# Patient Record
Sex: Male | Born: 1940 | Race: White | Hispanic: No | Marital: Married | Smoking: Never smoker
Health system: Southern US, Community
[De-identification: ages and names within clinical notes are randomized; demographics above are authoritative.]

## PROBLEM LIST (undated history)

## (undated) DIAGNOSIS — C61 Malignant neoplasm of prostate: Secondary | ICD-10-CM

## (undated) DIAGNOSIS — E119 Type 2 diabetes mellitus without complications: Secondary | ICD-10-CM

## (undated) DIAGNOSIS — I10 Essential (primary) hypertension: Secondary | ICD-10-CM

## (undated) DIAGNOSIS — R011 Cardiac murmur, unspecified: Secondary | ICD-10-CM

## (undated) DIAGNOSIS — I2510 Atherosclerotic heart disease of native coronary artery without angina pectoris: Secondary | ICD-10-CM

## (undated) DIAGNOSIS — E785 Hyperlipidemia, unspecified: Secondary | ICD-10-CM

## (undated) HISTORY — DX: Atherosclerotic heart disease of native coronary artery without angina pectoris: I25.10

## (undated) HISTORY — DX: Type 2 diabetes mellitus without complications: E11.9

## (undated) HISTORY — PX: HERNIA REPAIR: SHX51

## (undated) HISTORY — DX: Hyperlipidemia, unspecified: E78.5

## (undated) HISTORY — PX: PACEMAKER PLACEMENT: SHX43

## (undated) HISTORY — PX: CORONARY STENT PLACEMENT: SHX1402

## (undated) HISTORY — DX: Essential (primary) hypertension: I10

## (undated) HISTORY — DX: Cardiac murmur, unspecified: R01.1

## (undated) HISTORY — PX: CORONARY ARTERY BYPASS GRAFT: SHX141

---

## 2004-01-01 ENCOUNTER — Other Ambulatory Visit: Payer: Self-pay

## 2004-08-01 ENCOUNTER — Other Ambulatory Visit: Payer: Self-pay

## 2006-04-29 ENCOUNTER — Ambulatory Visit: Payer: Self-pay | Admitting: Gastroenterology

## 2006-12-23 ENCOUNTER — Ambulatory Visit: Payer: Self-pay | Admitting: Cardiovascular Disease

## 2007-10-25 ENCOUNTER — Inpatient Hospital Stay: Payer: Self-pay | Admitting: *Deleted

## 2007-10-25 ENCOUNTER — Other Ambulatory Visit: Payer: Self-pay

## 2008-03-28 ENCOUNTER — Ambulatory Visit: Payer: Self-pay | Admitting: Cardiovascular Disease

## 2010-09-05 ENCOUNTER — Inpatient Hospital Stay: Payer: Self-pay | Admitting: *Deleted

## 2013-01-01 ENCOUNTER — Inpatient Hospital Stay: Payer: Self-pay | Admitting: Internal Medicine

## 2013-01-01 LAB — BASIC METABOLIC PANEL
Anion Gap: 6 — ABNORMAL LOW (ref 7–16)
BUN: 14 mg/dL (ref 7–18)
Calcium, Total: 9.1 mg/dL (ref 8.5–10.1)
Chloride: 106 mmol/L (ref 98–107)
Co2: 28 mmol/L (ref 21–32)
Creatinine: 0.88 mg/dL (ref 0.60–1.30)
EGFR (African American): >60
EGFR (Non-African Amer.): >60
Glucose: 126 mg/dL — ABNORMAL HIGH (ref 65–99)
Osmolality: 281 (ref 275–301)
Potassium: 4.4 mmol/L (ref 3.5–5.1)
Sodium: 140 mmol/L (ref 136–145)

## 2013-01-01 LAB — CBC WITH DIFFERENTIAL/PLATELET
Basophil #: 0 10*3/uL (ref 0.0–0.1)
Basophil %: 0.2 %
Eosinophil #: 0.1 10*3/uL (ref 0.0–0.7)
Eosinophil %: 0.8 %
HCT: 41.6 % (ref 40.0–52.0)
HGB: 13.2 g/dL (ref 13.0–18.0)
Lymphocyte #: 2.2 10*3/uL (ref 1.0–3.6)
Lymphocyte %: 22.2 %
MCH: 29.6 pg (ref 26.0–34.0)
MCHC: 31.7 g/dL — ABNORMAL LOW (ref 32.0–36.0)
MCV: 93 fL (ref 80–100)
Monocyte #: 0.8 x10 3/mm (ref 0.2–1.0)
Monocyte %: 8.3 %
Neutrophil #: 6.8 10*3/uL — ABNORMAL HIGH (ref 1.4–6.5)
Neutrophil %: 68.5 %
Platelet: 144 10*3/uL — ABNORMAL LOW (ref 150–440)
RBC: 4.46 10*6/uL (ref 4.40–5.90)
RDW: 14.3 % (ref 11.5–14.5)
WBC: 10 10*3/uL (ref 3.8–10.6)

## 2013-01-01 LAB — CK TOTAL AND CKMB (NOT AT ARMC)
CK, Total: 148 U/L (ref 35–232)
CK, Total: 139 U/L (ref 35–232)
CK-MB: 1.3 ng/mL (ref 0.5–3.6)
CK-MB: 0.8 ng/mL (ref 0.5–3.6)

## 2013-01-01 LAB — TROPONIN I
Troponin-I: <0.02 ng/mL
Troponin-I: 0.15 ng/mL — ABNORMAL HIGH

## 2013-01-01 LAB — PROTIME-INR
INR: 1
Prothrombin Time: 13.1 secs (ref 11.5–14.7)

## 2013-01-01 LAB — TSH: Thyroid Stimulating Horm: 3.61 u[IU]/mL

## 2013-01-02 LAB — CBC WITH DIFFERENTIAL/PLATELET
Basophil #: 0 10*3/uL (ref 0.0–0.1)
Basophil %: 0.6 %
Eosinophil #: 0.1 10*3/uL (ref 0.0–0.7)
Eosinophil %: 1.1 %
HCT: 39.8 % — ABNORMAL LOW (ref 40.0–52.0)
HGB: 13.1 g/dL (ref 13.0–18.0)
Lymphocyte #: 1.5 10*3/uL (ref 1.0–3.6)
Lymphocyte %: 20.4 %
MCH: 30.7 pg (ref 26.0–34.0)
MCHC: 33.1 g/dL (ref 32.0–36.0)
MCV: 93 fL (ref 80–100)
Monocyte #: 0.7 x10 3/mm (ref 0.2–1.0)
Monocyte %: 9.2 %
Neutrophil #: 5 10*3/uL (ref 1.4–6.5)
Neutrophil %: 68.7 %
Platelet: 125 10*3/uL — ABNORMAL LOW (ref 150–440)
RBC: 4.28 10*6/uL — ABNORMAL LOW (ref 4.40–5.90)
RDW: 14.5 % (ref 11.5–14.5)
WBC: 7.3 10*3/uL (ref 3.8–10.6)

## 2013-01-02 LAB — CK TOTAL AND CKMB (NOT AT ARMC)
CK, Total: 121 U/L (ref 35–232)
CK-MB: 0.6 ng/mL (ref 0.5–3.6)

## 2013-01-02 LAB — BASIC METABOLIC PANEL
Anion Gap: 7 (ref 7–16)
BUN: 16 mg/dL (ref 7–18)
Calcium, Total: 8.7 mg/dL (ref 8.5–10.1)
Chloride: 107 mmol/L (ref 98–107)
Co2: 27 mmol/L (ref 21–32)
Creatinine: 0.95 mg/dL (ref 0.60–1.30)
EGFR (African American): >60
EGFR (Non-African Amer.): >60
Glucose: 135 mg/dL — ABNORMAL HIGH (ref 65–99)
Osmolality: 284 (ref 275–301)
Potassium: 4.6 mmol/L (ref 3.5–5.1)
Sodium: 141 mmol/L (ref 136–145)

## 2013-01-02 LAB — TROPONIN I: Troponin-I: 0.05 ng/mL

## 2013-01-02 LAB — MAGNESIUM: Magnesium: 1.7 mg/dL — ABNORMAL LOW

## 2013-01-02 LAB — LIPID PANEL
Cholesterol: 112 mg/dL (ref 0–200)
HDL Cholesterol: 49 mg/dL (ref 40–60)
Ldl Cholesterol, Calc: 42 mg/dL (ref 0–100)
Triglycerides: 105 mg/dL (ref 0–200)
VLDL Cholesterol, Calc: 21 mg/dL (ref 5–40)

## 2013-01-02 LAB — HEMOGLOBIN A1C: Hemoglobin A1C: 7.6 % — ABNORMAL HIGH (ref 4.2–6.3)

## 2013-01-02 LAB — APTT: Activated PTT: 26.5 secs (ref 23.6–35.9)

## 2013-01-03 LAB — BASIC METABOLIC PANEL
Anion Gap: 6 — ABNORMAL LOW (ref 7–16)
BUN: 14 mg/dL (ref 7–18)
Calcium, Total: 8.2 mg/dL — ABNORMAL LOW (ref 8.5–10.1)
Chloride: 108 mmol/L — ABNORMAL HIGH (ref 98–107)
Co2: 26 mmol/L (ref 21–32)
Creatinine: 0.88 mg/dL (ref 0.60–1.30)
EGFR (African American): >60
EGFR (Non-African Amer.): >60
Glucose: 134 mg/dL — ABNORMAL HIGH (ref 65–99)
Osmolality: 282 (ref 275–301)
Potassium: 4 mmol/L (ref 3.5–5.1)
Sodium: 140 mmol/L (ref 136–145)

## 2014-05-31 ENCOUNTER — Emergency Department: Payer: Self-pay | Admitting: Emergency Medicine

## 2014-05-31 LAB — URINALYSIS, COMPLETE
Bacteria: NONE SEEN
Bilirubin,UR: NEGATIVE
Blood: NEGATIVE
Glucose,UR: >=500 mg/dL (ref 0–75)
Ketone: NEGATIVE
Leukocyte Esterase: NEGATIVE
Nitrite: NEGATIVE
Ph: 6 (ref 4.5–8.0)
Protein: NEGATIVE
RBC,UR: 1 /HPF (ref 0–5)
Specific Gravity: 1.021 (ref 1.003–1.030)
Squamous Epithelial: NONE SEEN
WBC UR: <1 /HPF (ref 0–5)

## 2015-03-17 NOTE — Discharge Summary (Signed)
PATIENT NAMEJANZEN, Paul Parker MR#:  [id number] DATE OF BIRTH:  1941-06-11  DATE OF ADMISSION:  01/01/2013 DATE OF DISCHARGE:  01/03/2013   ADMITTING DIAGNOSIS: Complete  heart block.   DISCHARGE DIAGNOSES:  1.  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 Neoma Laming.  2.  Status post deep vein thrombosis.  3.  Permanent pacemaker placement on January 02, 2013, by Dr. Lavera Guise.  4.  Elevated troponin, no acute coronary syndrome.  5.  Hypomagnesemia.  6.  Thrombocytopenia.  7.  History of coronary artery disease status post coronary artery bypass grafting.  8.  Atrial flutter, atrial fibrillation in sinus rhythm now.  9.  Diabetes mellitus with hemoglobin A1c 7.6.  10.  Hypertension.  11.  Hyperlipidemia.  12.  Hypothyroidism.  13.  Gastroesophageal reflux disease.  14.  Peripheral vascular disease.   DISCHARGE CONDITION: Stable.   DISCHARGE MEDICATIONS: The patient is to resume his outpatient medications which are:  1.  Nitroglycerin 4 mg sublingually every five minutes as needed.  2.  Aspirin 81 mg p.o. daily.  3.  Actos 45 mg p.o. daily.  4.  Plavix 75 mg p.o. daily.  5.  Ranitidine 150 mg p.o. twice daily.  6.  Cymbalta 30 mg p.o. every 2 to 3 days.  7.  Atorvastatin 80 mg p.o. at bedtime.  8.  Levothyroxine 50 mcg p.o. daily.  9.  Amlodipine 5 mg p.o. daily.  10.  Enalapril 5 mg p.o. daily.  11.  Amiodarone 200 mg p.o. once daily.   The patient is to hold metformin until January 05, 2013, then resume.   DIET: Two grams salt, low fat, low cholesterol, carbohydrate-controlled diet, regular consistency.   Activity limitations: As tolerated.   Followup appointment: Dr. Jeananne Rama in two days after discharge; Dr. Neoma Laming in 1 week after discharge; Dr. Lavera Guise in one day after discharge.   CONSULTANTS: Dr. Neoma Laming, Dr. Lavera Guise.   RADIOLOGIC STUDIES: Chest x-ray, portable single view, January 02, 2013, showed stable chest.   Pacemaker in good anatomic position. No complicating features were noted.  Lungs were clear.    HISTORY OF PRESENT ILLNESS: The patient is a 74 year old Caucasian male with a history of atrial fibrillation, atrial flutter, who was converted to sinus rhythm while on p.o. amiodarone several months ago, presented to the hospital with lightheadedness, dizziness, and was noted to be bradycardic. He was also found to have complete heart block and was admitted to the hospital. On arrival to the hospital, the patient's vitals revealed that his temperature was 98.1, pulse was 38.  Respirations were 11. Blood pressure 147/60. Oxygen saturation was 97% on room air.  Physical exam was unremarkable.  The patient's EKG showed complete heart block, rate in the 30s.  Lab data on admission showed glucose of 126, otherwise BMP was unremarkable. The patient's cardiac enzymes, first set was normal and troponin was slightly elevated at 0.15 on the second set, and normal on the third set. TSH was normal at 3.621. The patient's CBC was within normal limits with low platelet count of 144. Absolute neutrophil count was slightly elevated at 6.8. Coagulation panel was unremarkable. The patient was admitted to the hospital for further evaluation, consultation with Dr. Lavera Guise as well as Dr. Neoma Laming were obtained. Dr. Neoma Laming recommended to place temporary pacemaker, which was placed February 7th, 2014.  Dr. Lavera Guise then took the patient to the cath lab for permanent pacemaker placement on January 02, 2013.  Postprocedure, the patient did well, did not complain of any significant discomfort and no chest pains prior or during the episode of complete heart block or postprocedure except of some discomfort in permanent pacemaker placement area.  The patient's vital signs were stable on the day of discharge: Temperature 98, pulse was 60, respiration rate was 20 to 24, blood pressure 113 to 135 over 25'Z to 56L diastolic and oxygen  saturation was 95% to 96% on room air at rest. The patient is being discharged home. He is to follow up with cardiology, Dr. Neoma Laming, as well as Dr. Lavera Guise in the next few days after discharge, as well as Dr. Jeananne Rama, his primary care physician, in the next few days after discharge.    In regards to elevated troponin, it was felt to be possibly related to episodes of bradycardia; however, we did not feel it was acute coronary syndrome or heart attack as the patient's MB fraction as well as CK total levels were within normal limits.   For thrombocytopenia, the patient's platelet count was checked again on January 02, 2013, however, it remains low at 125.  It is recommended to follow the patient's platelet count as outpatient and make decisions about holding his Plavix if needed.   For coronary artery disease, the patient is to continue his outpatient management.   For history of diabetes mellitus, the patient's hemoglobin A1c was checked and was found to be 7.6. The patient is to continue his outpatient medications except metformin. He is to hold metformin until January 05, 2013.   For hyperlipidemia, hypothyroidism, the patient is to continue his outpatient management. The patient was noted to have normal magnesium level on January 02, 2013.  That was supplemented intravenously.  The patient is to follow up with his primary care physician as well as his cardiologist in the next few days after discharge.   TIME SPENT: Forty minutes.    ____________________________ Theodoro Grist, MD rv:th D: 01/03/2013 16:44:03 ET T: 01/03/2013 18:21:47 ET JOB#: [id number]  cc: Theodoro Grist, MD, <Dictator> Dionisio David, MD Cletis Athens, MD DR. Marin Shutter MD ELECTRONICALLY SIGNED 01/16/2013 20:18

## 2015-03-17 NOTE — Op Note (Signed)
PATIENT NAMEGARRISON, Paul Parker MR#:  [id number] DATE OF BIRTH:  Dec 26, 1940  DATE OF PROCEDURE:  01/02/2013  PROCEDURE PERFORMED: Insertion of permanent pacemaker.   CLINICAL HISTORY: The patient was admitted to the hospital from the Emergency Room with complete heart block and underwent insertion of a temporary pacemaker in the right groin and later on admitted to ICU.   ATTENDING CARDIOLOGIST: Dionisio David, MD   IMPLANTING PHYSICIAN: Cletis Athens, MD  REPORT OF PROCEDURE: The patient was taken to the Operating Room and the left upper chest was prepared in a sterile fashion. It was draped in a sterile fashion and then 1% lidocaine was infiltrated into the skin and subcutaneous tissue overlying the deltopectoral groove. Next, an incision was made on the overlying skin and a pocket was dissected out under the superficial fascia. The cephalic vein was isolated, but it was too small for a catheter to be introduced, so the patient was put in Trendelenburg position and a left subclavian stick was made, 1 for the atrial lead, 1 for the ventricular lead. Ventricular lead was introduced through the introduction site and optimum position was obtained on the floor of the right ventricle, where stimulation thresholds were measured and they were found to be satisfactory. This lead was tied to the pectoralis major muscle and then the atrial lead was also introduced to the atrial channel, which was already established, and optimum position was obtained close to the atrial appendage area on the right side. Stimulation thresholds were found to be satisfactory, so the atrial lead was also tied to the pectoralis major muscle securely. Both lead stimulation thresholds were found to be satisfactory and they were checked 3 times. Then both leads were connected to the pacemaker, Adapta ADDR01. All connections were tightened securely. Pacemaker was put in the pocket, which was already created under the superficial fascia. The  subcutaneous tissue and skin were sutured separately, and the patient was returned to the PACU in a stable condition. Family was notified about the patient's condition.   ACCESSORY CLINICAL DATA: Medtronic atrial lead model #5592, 53 cm, [id number] V. Ventricular lead model #5092, 58 cm, [id number] V. Pacemaker model Adapta, made by [employer], serial [id number]. Rate setting: Low rate 60, upper rate 130, paced AV interval 150 ms, sensed AV 120 ms, mode DDDR.  ____________________________ Cletis Athens, MD jm:jm D: 01/02/2013 11:36:00 ET T: 01/02/2013 15:02:58 ET JOB#: [id number]  cc: Cletis Athens, MD, <Dictator>      Cletis Athens MD ELECTRONICALLY SIGNED 02/01/2013 18:23

## 2015-03-17 NOTE — H&P (Signed)
PATIENT NAMEJAMEER, Parker MR#:  [id number] DATE OF BIRTH:  [birth year]  DATE OF ADMISSION:  01/01/2013  REFERRING PHYSICIAN: Dr. Neoma Laming from Cardiology.   CHIEF COMPLAINT: Dizziness and complete heart block.   HISTORY OF PRESENT ILLNESS: The patient is a pleasant 74 year old male with a history of CAD status post CABG, a-fib, A-flutter, who was converted through p.o. amio per Dr. Humphrey Rolls several months ago, a history of diabetes, hypertension, and peripheral vascular disease. Recently he has had some workup for dizziness including a CT angio which showed some carotid stenosis, left greater than the right, about 70% per Dr. Humphrey Rolls. He was referred to Dr. Bunnie Domino office for the carotid artery stenosis and while he was there, they noticed him to be pretty bradycardic. EKG at that time showed that he was in complete heart block, and I was called by Dr. Laurelyn Sickle office to be admitted for further evaluation.   The patient stated that his dizziness started about a week ago. He has positional dizziness, and almost fell but did not have any loss of consciousness or actual falls. He had some nausea and tried meclizine, which did not help. He has been overall feeling weak for a couple of weeks. No fevers, chills, URI symptoms, no chest pains currently. He is in the CCU and I discussed the case with Dr. Humphrey Rolls. He is to undergo a temporary pacer soon.   PAST MEDICAL HISTORY:  1. A-fib/a-flutter status post cardioversion with p.o. amnio, was previously in sinus recently.  2. A history of CAD status post stenting and CABG. 3. A history of peripheral vascular disease.  4. Diabetes.  5. Hypertension.  6. Hyperlipidemia.   PRIMARY CARE PHYSICIAN: Dr. Jeananne Rama.   ALLERGIES: None.   SOCIAL HISTORY: No tobacco, alcohol or drug use. Lives with his wife.  FAMILY HISTORY: CAD.   OUTPATIENT MEDICATIONS: Enalapril 5 mg daily, Lipitor 80 mg daily, meclizine 25 mg p.r.n., Nitrostat 0.4 mg sublingual as needed for chest  pain, Plavix 75 mg daily, Ranexa 500 mg twice daily, ranitidine HCL 150 mg twice a day, Actos 45 mg daily, aspirin 81 mg daily, Cymbalta 60 mg daily, levothyroxin 50 mcg daily, metformin 500 mg once a day, amiodarone 100 mg daily, amlodipine 5 mg daily.   REVIEW OF SYSTEMS:  CONSTITUTIONAL: No fever. Positive for weakness and fatigue and dizziness.  EYES: No blurry vision or double vision.  ENT: No tinnitus, hearing loss, or postnasal drip.  RESPIRATORY: No shortness of breath, cough, or wheezing.  CARDIOVASCULAR: History of a-fib and flutter, CAD and CABG. Currently he has no chest pain or palpitations. No syncope.  GASTROINTESTINAL: Had some nausea, not now. No vomiting, diarrhea, or abdominal pain.  GENITOURINARY: Denies dysuria, hematuria, or frequency. ENDOCRINOLOGY: Has hypothyroid state. HEMATOLOGIC AND LYMPHATIC: No anemia or easy bruising.  SKIN: No new rashes.  MUSCULOSKELETAL: No arthritis. NEUROLOGIC: No history of stroke. No numbness or weakness focally.  PSYCHIATRIC: No anxiety or depression.   PHYSICAL EXAMINATION:  VITAL SIGNS: On arrival temperature 98.1, pulse rate currently is 38, respiratory rate 11, blood pressure 147/62, O2 sat 97% on room air.  GENERAL: The patient is an elderly Caucasian male sitting in bed with no acute distress.  HEENT: Normocephalic, atraumatic. Pupils are equal and reactive. Anicteric sclerae. Moist mucous membranes.  NECK: Supple. No thyroid tenderness. No cervical lymphadenopathy.  CARDIOVASCULAR: S1, S2, bradycardic. No significant murmurs appreciated.  LUNGS: Clear to auscultation without wheezing or rhonchi.  ABDOMEN: Soft, nontender, nondistended. Positive bowel sounds in  all quadrants, no organomegaly appreciated.  SKIN: No obvious rashes.  EXTREMITIES: No significant lower extremity edema.  NEUROLOGICAL: Cranial nerves II through XII appear to be grossly intact. Strength is 5/5 in all extremities, sensation is intact to light touch.   PSYCHIATRIC: Awake, alert, and oriented x 2, did not know the dates, and he was oriented to place and person.   LABORATORY DATA: No labs so far except for a blood sugar which is 103. EKG from this morning shows a complete heart block, rate of 30s.   ASSESSMENT AND PLAN: We have a pleasant 74 year old male with a history of atrial fibrillation, A-flutter status post p.o. cardioversion with amnio who recently was experiencing some sinus brady and amiodarone dose was decreased, a history of peripheral vascular disease, who has had a recent diagnosis of carotid artery stenosis who was being evaluated by Dr. Bunnie Domino office this morning and who noticed him to be in complete heart block. The patient of note has been having dizziness, lightheadedness for the past week or so. At this point he has been admitted to the Critical Care Unit by me, direct admission as per Dr. Humphrey Rolls, and I discussed the case with him. He is to have a temporary pacer today and we will consult Dr. Dellia Beckwith to place a permanent pacemaker. We are holding the amiodarone as well as the Plavix. He has no chest pain and has had a recent echocardiogram as an outpatient. He has low normal ejection fraction and mild diastolic dysfunction, but he does not appear to be in volume overload state. He does not need Lasix. Would trend cardiac enzymes and get an EKG in the morning. We would hold the blood pressure medications for now and resume them tomorrow as blood pressures are stable. I would also make him n.p.o. past midnight for a possible pacemaker placement. The dizziness likely is from a complete heart block. It is possible it started around Friday or later as his symptoms started then per wife. I would start him on some gentle IV fluids. In regards to the coronary artery disease, it appears to be stable and we would resume aspirin, Plavix, statin once we are able to. In regards to the diabetes, we would on start sliding scale insulin and hold all the oral  medications and check a hemoglobin A1c. The patient would need follow up with Dr. Lucky Cowboy as an outpatient for further evaluation of the carotid artery stenosis. Would start him on heparin for deep vein thrombosis prophylaxis.   CODE STATUS: FULL CODE.   CRITICAL CARE TIME SPENT: 65 minutes and the patient's family is made aware that the patient is a high risk of having cardiac complications and arrest at this point.   PRIMARY CARE PHYSICIAN: [place] Dr. Neoma Laming Dr. Rebecka Apley   ____________________________ Vivien Presto, MD sa:jm D: 01/01/2013 14:16:38 ET T: 01/01/2013 14:39:34 ET JOB#: [id number]  cc: Vivien Presto, MD, <Dictator> Guadalupe Maple, MD Dionisio David, MD Cletis Athens, MD Karel Jarvis [facility] MD ELECTRONICALLY SIGNED 01/11/2013 15:48

## 2015-05-23 DIAGNOSIS — I10 Essential (primary) hypertension: Secondary | ICD-10-CM | POA: Insufficient documentation

## 2015-05-23 DIAGNOSIS — E119 Type 2 diabetes mellitus without complications: Secondary | ICD-10-CM | POA: Insufficient documentation

## 2015-05-23 DIAGNOSIS — E1159 Type 2 diabetes mellitus with other circulatory complications: Secondary | ICD-10-CM | POA: Insufficient documentation

## 2015-05-23 DIAGNOSIS — E785 Hyperlipidemia, unspecified: Secondary | ICD-10-CM | POA: Insufficient documentation

## 2015-05-23 DIAGNOSIS — E1169 Type 2 diabetes mellitus with other specified complication: Secondary | ICD-10-CM | POA: Insufficient documentation

## 2015-05-23 DIAGNOSIS — I152 Hypertension secondary to endocrine disorders: Secondary | ICD-10-CM | POA: Insufficient documentation

## 2015-05-24 ENCOUNTER — Encounter: Payer: Self-pay | Admitting: Family Medicine

## 2015-05-24 ENCOUNTER — Ambulatory Visit (INDEPENDENT_AMBULATORY_CARE_PROVIDER_SITE_OTHER): Payer: Medicare Other | Admitting: Family Medicine

## 2015-05-24 VITALS — BP 99/60 | HR 76 | Temp 98.6°F | Ht 69.0 in | Wt 174.0 lb

## 2015-05-24 DIAGNOSIS — E119 Type 2 diabetes mellitus without complications: Secondary | ICD-10-CM

## 2015-05-24 DIAGNOSIS — E785 Hyperlipidemia, unspecified: Secondary | ICD-10-CM

## 2015-05-24 DIAGNOSIS — I10 Essential (primary) hypertension: Secondary | ICD-10-CM

## 2015-05-24 LAB — BAYER DCA HB A1C WAIVED: HB A1C (BAYER DCA - WAIVED): 7.3 % — ABNORMAL HIGH (ref <7.0)

## 2015-05-24 NOTE — Assessment & Plan Note (Signed)
The current medical regimen is effective;  continue present plan and medications.  

## 2015-05-24 NOTE — Progress Notes (Signed)
   BP 99/60 mmHg  Pulse 76  Temp(Src) 98.6 F (37 C)  Ht 5\' 9"  (1.753 m)  Wt 174 lb (78.926 kg)  BMI 25.68 kg/m2  SpO2 96%   Subjective:    Patient ID: Paul Parker, male    DOB: 05/26/1941, 74 y.o.   MRN: [id number]  HPI: Paul Parker is a 74 y.o. male  Chief Complaint  Patient presents with  . Diabetes  BP and lipids stable No c/o with meds no side effects Takes every day  No hypoglycemia spells   Relevant past medical, surgical, family and social history reviewed and updated as indicated. Interim medical history since our last visit reviewed. Allergies and medications reviewed and updated.  Review of Systems  Constitutional: Negative.   Respiratory: Negative.   Cardiovascular: Negative.     Per HPI unless specifically indicated above     Objective:    BP 99/60 mmHg  Pulse 76  Temp(Src) 98.6 F (37 C)  Ht 5\' 9"  (1.753 m)  Wt 174 lb (78.926 kg)  BMI 25.68 kg/m2  SpO2 96%  Wt Readings from Last 3 Encounters:  05/24/15 174 lb (78.926 kg)  02/08/15 173 lb (78.472 kg)    Physical Exam  Constitutional: He is oriented to person, place, and time. He appears well-developed and well-nourished. No distress.  HENT:  Head: Normocephalic and atraumatic.  Right Ear: Hearing normal.  Left Ear: Hearing normal.  Nose: Nose normal.  Eyes: Conjunctivae and lids are normal. Right eye exhibits no discharge. Left eye exhibits no discharge. No scleral icterus.  Cardiovascular: Normal rate, regular rhythm and normal heart sounds.   Pulmonary/Chest: Effort normal and breath sounds normal. No respiratory distress.  Musculoskeletal: Normal range of motion.  Neurological: He is alert and oriented to person, place, and time.  Skin: Skin is intact. No rash noted.  Psychiatric: He has a normal mood and affect. His speech is normal and behavior is normal. Judgment and thought content normal. Cognition and memory are normal.    Results for orders placed or performed in visit on 05/31/14   Urinalysis, Complete  Result Value Ref Range   Color - urine Yellow    Clarity - urine Clear    Glucose,UR >=500 0-75 mg/dL   Bilirubin,UR Negative NEGATIVE   Ketone Negative NEGATIVE   Specific Gravity 1.021 1.003-1.030   Blood Negative NEGATIVE   Ph 6.0 4.5-8.0   Protein Negative NEGATIVE   Nitrite Negative NEGATIVE   Leukocyte Esterase Negative NEGATIVE   RBC,UR 1 /HPF 0-5 /HPF   WBC UR <1 /HPF 0-5 /HPF   Bacteria NONE SEEN NONE SEEN   Squamous Epithelial NONE SEEN       Assessment & Plan:   Problem List Items Addressed This Visit      Cardiovascular and Mediastinum   Hypertension    The current medical regimen is effective;  continue present plan and medications.         Endocrine   Diabetes mellitus without complication - Primary    The current medical regimen is effective;  continue present plan and medications.       Relevant Orders   Bayer DCA Hb A1c Waived     Other   Hyperlipidemia    The current medical regimen is effective;  continue present plan and medications.           Follow up plan: Return in about 3 months (around 08/24/2015) for Physical Exam and a1c.

## 2015-08-01 ENCOUNTER — Other Ambulatory Visit: Payer: Self-pay | Admitting: Family Medicine

## 2015-08-01 NOTE — Telephone Encounter (Signed)
Needs seen further refills 

## 2015-08-30 ENCOUNTER — Ambulatory Visit (INDEPENDENT_AMBULATORY_CARE_PROVIDER_SITE_OTHER): Payer: Medicare Other | Admitting: Family Medicine

## 2015-08-30 ENCOUNTER — Encounter: Payer: Self-pay | Admitting: Family Medicine

## 2015-08-30 VITALS — BP 136/76 | HR 69 | Temp 98.9°F | Ht 68.5 in | Wt 175.0 lb

## 2015-08-30 DIAGNOSIS — F329 Major depressive disorder, single episode, unspecified: Secondary | ICD-10-CM

## 2015-08-30 DIAGNOSIS — E039 Hypothyroidism, unspecified: Secondary | ICD-10-CM | POA: Diagnosis not present

## 2015-08-30 DIAGNOSIS — D692 Other nonthrombocytopenic purpura: Secondary | ICD-10-CM | POA: Insufficient documentation

## 2015-08-30 DIAGNOSIS — E119 Type 2 diabetes mellitus without complications: Secondary | ICD-10-CM

## 2015-08-30 DIAGNOSIS — Z0000 Encounter for general adult medical examination without abnormal findings: Secondary | ICD-10-CM

## 2015-08-30 DIAGNOSIS — I10 Essential (primary) hypertension: Secondary | ICD-10-CM

## 2015-08-30 DIAGNOSIS — S060X9A Concussion with loss of consciousness of unspecified duration, initial encounter: Secondary | ICD-10-CM | POA: Insufficient documentation

## 2015-08-30 DIAGNOSIS — I25111 Atherosclerotic heart disease of native coronary artery with angina pectoris with documented spasm: Secondary | ICD-10-CM

## 2015-08-30 DIAGNOSIS — Z23 Encounter for immunization: Secondary | ICD-10-CM

## 2015-08-30 DIAGNOSIS — F339 Major depressive disorder, recurrent, unspecified: Secondary | ICD-10-CM | POA: Insufficient documentation

## 2015-08-30 DIAGNOSIS — F32A Depression, unspecified: Secondary | ICD-10-CM

## 2015-08-30 DIAGNOSIS — S060X5S Concussion with loss of consciousness greater than 24 hours with return to pre-existing conscious level, sequela: Secondary | ICD-10-CM

## 2015-08-30 DIAGNOSIS — E785 Hyperlipidemia, unspecified: Secondary | ICD-10-CM | POA: Diagnosis not present

## 2015-08-30 DIAGNOSIS — N400 Enlarged prostate without lower urinary tract symptoms: Secondary | ICD-10-CM | POA: Insufficient documentation

## 2015-08-30 DIAGNOSIS — I2510 Atherosclerotic heart disease of native coronary artery without angina pectoris: Secondary | ICD-10-CM | POA: Insufficient documentation

## 2015-08-30 LAB — URINALYSIS, ROUTINE W REFLEX MICROSCOPIC
Bilirubin, UA: NEGATIVE
Ketones, UA: NEGATIVE
Leukocytes, UA: NEGATIVE
Nitrite, UA: NEGATIVE
Protein, UA: NEGATIVE
RBC, UA: NEGATIVE
Specific Gravity, UA: 1.015 (ref 1.005–1.030)
Urobilinogen, Ur: 0.2 mg/dL (ref 0.2–1.0)
pH, UA: 5 (ref 5.0–7.5)

## 2015-08-30 LAB — BAYER DCA HB A1C WAIVED: HB A1C (BAYER DCA - WAIVED): 7.2 % — ABNORMAL HIGH (ref <7.0)

## 2015-08-30 MED ORDER — PIOGLITAZONE HCL 45 MG PO TABS: 45.0000 mg | ORAL_TABLET | Freq: Every day | ORAL | Status: DC

## 2015-08-30 MED ORDER — RANITIDINE HCL 150 MG PO TABS: 150.0000 mg | ORAL_TABLET | Freq: Two times a day (BID) | ORAL | Status: DC

## 2015-08-30 MED ORDER — CLOPIDOGREL BISULFATE 75 MG PO TABS: 75.0000 mg | ORAL_TABLET | Freq: Every day | ORAL | Status: DC

## 2015-08-30 MED ORDER — DULOXETINE HCL 30 MG PO CPEP: 30.0000 mg | ORAL_CAPSULE | Freq: Every day | ORAL | Status: DC

## 2015-08-30 MED ORDER — AMLODIPINE BESYLATE 5 MG PO TABS: 5.0000 mg | ORAL_TABLET | Freq: Every day | ORAL | Status: DC

## 2015-08-30 MED ORDER — ATORVASTATIN CALCIUM 40 MG PO TABS: 40.0000 mg | ORAL_TABLET | Freq: Every day | ORAL | Status: DC

## 2015-08-30 MED ORDER — ENALAPRIL MALEATE 20 MG PO TABS: 20.0000 mg | ORAL_TABLET | Freq: Every day | ORAL | Status: DC

## 2015-08-30 MED ORDER — METFORMIN HCL 500 MG PO TABS: 1000.0000 mg | ORAL_TABLET | Freq: Two times a day (BID) | ORAL | Status: DC

## 2015-08-30 MED ORDER — LEVOTHYROXINE SODIUM 50 MCG PO TABS: 50.0000 ug | ORAL_TABLET | Freq: Every day | ORAL | Status: DC

## 2015-08-30 NOTE — Assessment & Plan Note (Signed)
The current medical regimen is effective;  continue present plan and medications.  

## 2015-08-30 NOTE — Assessment & Plan Note (Signed)
Discussed blood thinners vitamins nutrition and protective measures

## 2015-08-30 NOTE — Progress Notes (Signed)
BP 136/76 mmHg  Pulse 69  Temp(Src) 98.9 F (37.2 C)  Ht 5' 8.5" (1.74 m)  Wt 175 lb (79.379 kg)  BMI 26.22 kg/m2  SpO2 97%   Subjective:    Patient ID: Paul Parker, male    DOB: 1941/11/02, 74 y.o.   MRN: [id number]  HPI: Paul Parker is a 74 y.o. male  Chief Complaint  Patient presents with  . Annual Exam   patient for annual wellness visit doing well metrix met.  Medical problems also reviewed diabetes doing well noted low blood sugar spells no fluid retention from Actos no side effects from any medications.  Hypertension doing well with good control no side effects no edema.  Nerves doing well with depression.  Cholesterol doing well  CAD doing well taking isosorbide and having no angina symptoms as long she takes this medicine. Is in normal sinus rhythm with Pacerone  Relevant past medical, surgical, family and social history reviewed and updated as indicated. Interim medical history since our last visit reviewed. Allergies and medications reviewed and updated.  Review of Systems  Constitutional: Negative.   HENT: Negative.   Eyes: Negative.   Respiratory: Negative.   Cardiovascular: Negative.   Gastrointestinal: Negative.   Endocrine: Negative.   Genitourinary: Negative.   Musculoskeletal: Negative.   Skin: Negative.   Allergic/Immunologic: Negative.   Neurological: Negative.   Hematological: Negative.   Psychiatric/Behavioral: Negative.     Per HPI unless specifically indicated above     Objective:    BP 136/76 mmHg  Pulse 69  Temp(Src) 98.9 F (37.2 C)  Ht 5' 8.5" (1.74 m)  Wt 175 lb (79.379 kg)  BMI 26.22 kg/m2  SpO2 97%  Wt Readings from Last 3 Encounters:  08/30/15 175 lb (79.379 kg)  05/24/15 174 lb (78.926 kg)  02/08/15 173 lb (78.472 kg)    Physical Exam  Constitutional: He is oriented to person, place, and time. He appears well-developed and well-nourished.  HENT:  Head: Normocephalic and atraumatic.  Right Ear: External ear normal.   Left Ear: External ear normal.  Eyes: Conjunctivae and EOM are normal. Pupils are equal, round, and reactive to light.  Neck: Normal range of motion. Neck supple.  Cardiovascular: Normal rate, regular rhythm, normal heart sounds and intact distal pulses.   Pulmonary/Chest: Effort normal and breath sounds normal.  Abdominal: Soft. Bowel sounds are normal. There is no splenomegaly or hepatomegaly.  Genitourinary: Rectum normal and penis normal.  Prostate enlarged  Musculoskeletal: Normal range of motion.  Neurological: He is alert and oriented to person, place, and time. He has normal reflexes.  Skin: No rash noted. No erythema.  Senile purpura changes on both arms  Psychiatric: He has a normal mood and affect. His behavior is normal. Judgment and thought content normal.    Results for orders placed or performed in visit on 05/24/15  Bayer DCA Hb A1c Waived  Result Value Ref Range   Bayer DCA Hb A1c Waived 7.3 (H) <7.0 %      Assessment & Plan:   Problem List Items Addressed This Visit      Cardiovascular and Mediastinum   Hypertension    The current medical regimen is effective;  continue present plan and medications.       Relevant Medications   enalapril (VASOTEC) 20 MG tablet   atorvastatin (LIPITOR) 40 MG tablet   amLODipine (NORVASC) 5 MG tablet   Other Relevant Orders   CBC with Differential/Platelet   Comprehensive metabolic panel  Urinalysis, Routine w reflex microscopic (not at [facility])   Senile purpura ([place])    Discussed blood thinners vitamins nutrition and protective measures      Relevant Medications   enalapril (VASOTEC) 20 MG tablet   atorvastatin (LIPITOR) 40 MG tablet   amLODipine (NORVASC) 5 MG tablet   CAD (coronary artery disease)    Followed by cardiology in stable as long as he takes medication.      Relevant Medications   enalapril (VASOTEC) 20 MG tablet   atorvastatin (LIPITOR) 40 MG tablet   amLODipine (NORVASC) 5 MG tablet   Other  Relevant Orders   CBC with Differential/Platelet   Lipid panel   Comprehensive metabolic panel     Endocrine   Diabetes mellitus without complication (HCC)    The current medical regimen is effective;  continue present plan and medications.       Relevant Medications   pioglitazone (ACTOS) 45 MG tablet   metFORMIN (GLUCOPHAGE) 500 MG tablet   enalapril (VASOTEC) 20 MG tablet   atorvastatin (LIPITOR) 40 MG tablet   Other Relevant Orders   Bayer DCA Hb A1c Waived   Hypothyroidism    The current medical regimen is effective;  continue present plan and medications.       Relevant Medications   levothyroxine (SYNTHROID, LEVOTHROID) 50 MCG tablet   Other Relevant Orders   TSH     Nervous and Auditory   Concussion with coma    Stable with no change in deficits in cognition        Genitourinary   BPH (benign prostatic hyperplasia)    Stable with no symptoms      Relevant Orders   PSA     Other   Hyperlipidemia    The current medical regimen is effective;  continue present plan and medications.       Relevant Medications   enalapril (VASOTEC) 20 MG tablet   atorvastatin (LIPITOR) 40 MG tablet   amLODipine (NORVASC) 5 MG tablet   Other Relevant Orders   Lipid panel   Comprehensive metabolic panel   Depression    The current medical regimen is effective;  continue present plan and medications.       Relevant Medications   DULoxetine (CYMBALTA) 30 MG capsule    Other Visit Diagnoses    PE (physical exam), annual    -  Primary    Immunization due        Relevant Orders    Flu Vaccine QUAD 36+ mos PF IM (Fluarix & Fluzone Quad PF) (Completed)        Follow up plan: Return in about 3 months (around 11/30/2015), or if symptoms worsen or fail to improve, for for recheck DM a1c.

## 2015-08-30 NOTE — Assessment & Plan Note (Signed)
Stable with no symptoms 

## 2015-08-30 NOTE — Assessment & Plan Note (Signed)
Stable with no change in deficits in cognition

## 2015-08-30 NOTE — Assessment & Plan Note (Signed)
Followed by cardiology in stable as long as he takes medication.

## 2015-08-31 ENCOUNTER — Telehealth: Payer: Self-pay | Admitting: Family Medicine

## 2015-08-31 DIAGNOSIS — D649 Anemia, unspecified: Secondary | ICD-10-CM

## 2015-08-31 LAB — CBC WITH DIFFERENTIAL/PLATELET
Basophils Absolute: 0 10*3/uL (ref 0.0–0.2)
Basos: 0 %
EOS (ABSOLUTE): 0.1 10*3/uL (ref 0.0–0.4)
Eos: 1 %
Hematocrit: 37.7 % (ref 37.5–51.0)
Hemoglobin: 12.2 g/dL — ABNORMAL LOW (ref 12.6–17.7)
Immature Grans (Abs): 0 10*3/uL (ref 0.0–0.1)
Immature Granulocytes: 0 %
Lymphocytes Absolute: 1.6 10*3/uL (ref 0.7–3.1)
Lymphs: 27 %
MCH: 29.7 pg (ref 26.6–33.0)
MCHC: 32.4 g/dL (ref 31.5–35.7)
MCV: 92 fL (ref 79–97)
Monocytes Absolute: 0.6 10*3/uL (ref 0.1–0.9)
Monocytes: 11 %
Neutrophils Absolute: 3.6 10*3/uL (ref 1.4–7.0)
Neutrophils: 61 %
Platelets: 151 10*3/uL (ref 150–379)
RBC: 4.11 x10E6/uL — ABNORMAL LOW (ref 4.14–5.80)
RDW: 15 % (ref 12.3–15.4)
WBC: 5.8 10*3/uL (ref 3.4–10.8)

## 2015-08-31 LAB — LIPID PANEL
Chol/HDL Ratio: 2.3 ratio units (ref 0.0–5.0)
Cholesterol, Total: 122 mg/dL (ref 100–199)
HDL: 53 mg/dL (ref >39)
LDL Calculated: 57 mg/dL (ref 0–99)
Triglycerides: 62 mg/dL (ref 0–149)
VLDL Cholesterol Cal: 12 mg/dL (ref 5–40)

## 2015-08-31 LAB — COMPREHENSIVE METABOLIC PANEL
ALT: 18 IU/L (ref 0–44)
AST: 20 IU/L (ref 0–40)
Albumin/Globulin Ratio: 1.9 (ref 1.1–2.5)
Albumin: 4.3 g/dL (ref 3.5–4.8)
Alkaline Phosphatase: 46 IU/L (ref 39–117)
BUN/Creatinine Ratio: 16 (ref 10–22)
BUN: 14 mg/dL (ref 8–27)
Bilirubin Total: 0.4 mg/dL (ref 0.0–1.2)
CO2: 26 mmol/L (ref 18–29)
Calcium: 9.6 mg/dL (ref 8.6–10.2)
Chloride: 100 mmol/L (ref 97–108)
Creatinine, Ser: 0.89 mg/dL (ref 0.76–1.27)
GFR calc Af Amer: 97 mL/min/{1.73_m2} (ref >59)
GFR calc non Af Amer: 84 mL/min/{1.73_m2} (ref >59)
Globulin, Total: 2.3 g/dL (ref 1.5–4.5)
Glucose: 116 mg/dL — ABNORMAL HIGH (ref 65–99)
Potassium: 4.8 mmol/L (ref 3.5–5.2)
Sodium: 139 mmol/L (ref 134–144)
Total Protein: 6.6 g/dL (ref 6.0–8.5)

## 2015-08-31 LAB — PSA: Prostate Specific Ag, Serum: 3 ng/mL (ref 0.0–4.0)

## 2015-08-31 LAB — TSH: TSH: 4.4 u[IU]/mL (ref 0.450–4.500)

## 2015-08-31 NOTE — Telephone Encounter (Signed)
-----   Message from Wynn Maudlin, [place] sent at 08/31/2015 12:04 PM EDT ----- labs

## 2015-08-31 NOTE — Telephone Encounter (Signed)
Phone call Discussed with patient hemoglobin drifting down slightly Will do blood work to further evaluate for potential for iron deficiency anemia.

## 2015-09-06 ENCOUNTER — Other Ambulatory Visit: Payer: Medicare Other

## 2015-09-06 DIAGNOSIS — D649 Anemia, unspecified: Secondary | ICD-10-CM

## 2015-09-07 ENCOUNTER — Telehealth: Payer: Self-pay | Admitting: Family Medicine

## 2015-09-07 DIAGNOSIS — D539 Nutritional anemia, unspecified: Secondary | ICD-10-CM

## 2015-09-07 LAB — IRON AND TIBC
Iron Saturation: 15 % (ref 15–55)
Iron: 65 ug/dL (ref 38–169)
Total Iron Binding Capacity: 428 ug/dL (ref 250–450)
UIBC: 363 ug/dL — ABNORMAL HIGH (ref 111–343)

## 2015-09-07 LAB — CBC WITH DIFFERENTIAL/PLATELET
Basophils Absolute: 0 10*3/uL (ref 0.0–0.2)
Basos: 0 %
EOS (ABSOLUTE): 0.1 10*3/uL (ref 0.0–0.4)
Eos: 1 %
Hematocrit: 39.3 % (ref 37.5–51.0)
Hemoglobin: 12.7 g/dL (ref 12.6–17.7)
Immature Grans (Abs): 0 10*3/uL (ref 0.0–0.1)
Immature Granulocytes: 0 %
Lymphocytes Absolute: 1.6 10*3/uL (ref 0.7–3.1)
Lymphs: 31 %
MCH: 29.7 pg (ref 26.6–33.0)
MCHC: 32.3 g/dL (ref 31.5–35.7)
MCV: 92 fL (ref 79–97)
Monocytes Absolute: 0.5 10*3/uL (ref 0.1–0.9)
Monocytes: 9 %
Neutrophils Absolute: 2.9 10*3/uL (ref 1.4–7.0)
Neutrophils: 59 %
Platelets: 165 10*3/uL (ref 150–379)
RBC: 4.28 x10E6/uL (ref 4.14–5.80)
RDW: 14.9 % (ref 12.3–15.4)
WBC: 5 10*3/uL (ref 3.4–10.8)

## 2015-09-07 LAB — FERRITIN: Ferritin: 24 ng/mL — ABNORMAL LOW (ref 30–400)

## 2015-09-07 LAB — RETICULOCYTES: Retic Ct Pct: 1.4 % (ref 0.6–2.6)

## 2015-09-07 NOTE — Telephone Encounter (Signed)
Phone call Discussed with patient's wife who handles phone calls for patient He has iron deficiency anemia Discussed referral to gastroenterology to further evaluate iron deficiency.

## 2015-09-07 NOTE — Telephone Encounter (Signed)
-----   Message from Wynn Maudlin, [place] sent at 09/07/2015  5:19 PM EDT ----- Labs/ [name]

## 2015-09-07 NOTE — Telephone Encounter (Signed)
-----   Message from Wynn Maudlin, [place] sent at 09/07/2015  5:19 PM EDT ----- Labs/ [name]

## 2015-09-13 ENCOUNTER — Telehealth: Payer: Self-pay | Admitting: Family Medicine

## 2015-09-13 NOTE — Telephone Encounter (Signed)
pts wife would rather her husband see someone else other than Dr Allen Norris.

## 2015-09-15 NOTE — Telephone Encounter (Signed)
Apparently the pt has seen Dr Allen Norris in the past or he may be the only physician that would be able to take care of his issue. She could discuss that with their practice. But I do know that if he has seen Dr Allen Norris in the past that would be the provider that he would see again. I'm almost certain.

## 2015-09-27 ENCOUNTER — Encounter (INDEPENDENT_AMBULATORY_CARE_PROVIDER_SITE_OTHER): Payer: Self-pay

## 2015-09-27 ENCOUNTER — Encounter: Payer: Self-pay | Admitting: Gastroenterology

## 2015-09-27 ENCOUNTER — Ambulatory Visit: Payer: Self-pay | Admitting: Gastroenterology

## 2015-09-27 ENCOUNTER — Ambulatory Visit (INDEPENDENT_AMBULATORY_CARE_PROVIDER_SITE_OTHER): Payer: Medicare Other | Admitting: Gastroenterology

## 2015-09-27 VITALS — BP 120/69 | HR 69 | Temp 98.2°F | Ht 71.0 in | Wt 176.0 lb

## 2015-09-27 DIAGNOSIS — D509 Iron deficiency anemia, unspecified: Secondary | ICD-10-CM | POA: Diagnosis not present

## 2015-09-27 NOTE — Progress Notes (Signed)
Gastroenterology Consultation  Referring Provider:     Guadalupe Maple, MD Primary Care Physician:  Golden Pop, MD Primary Gastroenterologist:  Dr. Allen Norris     Reason for Consultation:     Anemia        HPI:   Paul Parker is a 74 y.o. y/o male referred for consultation & management of  anemia by Dr. Golden Pop, MD.   This patient comes in today with a report of anemia. The patient had blood work done that showed his hemoglobin to be low a few weeks back with a ferritin slightly low but normal iron studies. The patient had repeat labs done recently and states he has not been taking iron. His most recent labs showed his blood To be normal at this time. The patient also reports that he did not have a colonoscopy in over 12 years. The patient's wife is not sure he is strong enough to undergo a colonoscopy. The patient denies any black stools or bloody stools. There is no report of any nausea, vomiting, fevers, chills or unexplained weight loss.  Past Medical History  Diagnosis Date  . CAD (coronary artery disease)   . Heart murmur   . Diabetes mellitus without complication ([place])   . Hyperlipidemia   . Hypertension     Past Surgical History  Procedure Laterality Date  . Coronary artery bypass graft    . Hernia repair    . Pacemaker placement    . Coronary stent placement      Prior to Admission medications   Medication Sig Start Date End Date Taking? Authorizing Provider  amiodarone (PACERONE) 200 MG tablet Take 200 mg by mouth daily.   Yes Historical Provider, MD  amLODipine (NORVASC) 5 MG tablet Take 1 tablet (5 mg total) by mouth daily. 08/30/15  Yes Guadalupe Maple, MD  aspirin EC 81 MG tablet Take 81 mg by mouth daily.   Yes Historical Provider, MD  atorvastatin (LIPITOR) 40 MG tablet Take 1 tablet (40 mg total) by mouth daily. 08/30/15  Yes Guadalupe Maple, MD  clopidogrel (PLAVIX) 75 MG tablet Take 1 tablet (75 mg total) by mouth daily. 08/30/15  Yes Guadalupe Maple, MD    DULoxetine (CYMBALTA) 30 MG capsule Take 1 capsule (30 mg total) by mouth daily. 08/30/15  Yes Guadalupe Maple, MD  enalapril (VASOTEC) 20 MG tablet Take 1 tablet (20 mg total) by mouth daily. 08/30/15  Yes Guadalupe Maple, MD  isosorbide mononitrate (IMDUR) 30 MG 24 hr tablet Take 30 mg by mouth daily.   Yes Historical Provider, MD  levothyroxine (SYNTHROID, LEVOTHROID) 50 MCG tablet Take 1 tablet (50 mcg total) by mouth daily before breakfast. 08/30/15  Yes Guadalupe Maple, MD  metFORMIN (GLUCOPHAGE) 500 MG tablet Take 2 tablets (1,000 mg total) by mouth 2 (two) times daily with a meal. 08/30/15  Yes Guadalupe Maple, MD  naproxen (NAPROSYN) 500 MG tablet Take 500 mg by mouth 2 (two) times daily as needed.   Yes Historical Provider, MD  pioglitazone (ACTOS) 45 MG tablet Take 1 tablet (45 mg total) by mouth daily. 08/30/15  Yes Guadalupe Maple, MD  ranitidine (ZANTAC) 150 MG tablet Take 1 tablet (150 mg total) by mouth 2 (two) times daily. 08/30/15  Yes Guadalupe Maple, MD    Family History  Problem Relation Age of Onset  . Heart disease Mother   . Heart disease Father      Social History  Substance Use Topics  . Smoking status: Never Smoker   . Smokeless tobacco: Never Used  . Alcohol Use: No    Allergies as of 09/27/2015 - Review Complete 09/27/2015  Allergen Reaction Noted  . Amoxicillin Diarrhea and Nausea And Vomiting 05/23/2015    Review of Systems:    All systems reviewed and negative except where noted in HPI.   Physical Exam:  BP 120/69 mmHg  Pulse 69  Temp(Src) 98.2 F (36.8 C) (Oral)  Ht 5\' 11"  (1.803 m)  Wt 176 lb (79.833 kg)  BMI 24.56 kg/m2 No LMP for male patient. Psych:  Alert and cooperative. Normal mood and affect. General:   Alert,  Well-developed, well-nourished, pleasant and cooperative in NAD Head:  Normocephalic and atraumatic. Eyes:  Sclera clear, no icterus.   Conjunctiva pink. Ears:  Normal auditory acuity. Nose:  No deformity, discharge, or  lesions. Mouth:  No deformity or lesions,oropharynx pink & moist. Neck:  Supple; no masses or thyromegaly. Lungs:  Respirations even and unlabored.  Clear throughout to auscultation.   No wheezes, crackles, or rhonchi. No acute distress. Heart:  Regular rate and rhythm; no murmurs, clicks, rubs, or gallops. Abdomen:  Normal bowel sounds.  No bruits.  Soft, non-tender and non-distended without masses, hepatosplenomegaly or hernias noted.  No guarding or rebound tenderness.  Negative Carnett sign.   Rectal:  Deferred.  Msk:  Symmetrical without gross deformities.  Good, equal movement & strength bilaterally. Pulses:  Normal pulses noted. Extremities:  No clubbing or edema.  No cyanosis. Neurologic:   Grossly normal neurologically. Skin:  Intact without significant lesions or rashes.  No jaundice. Lymph Nodes:  No significant cervical adenopathy. Psych:  Alert and cooperative. Normal mood and affect.  Imaging Studies: No results found.  Assessment and Plan:   Paul Parker is a 74 y.o. y/o male Who comes in today with a previous blood test that showed him to have anemia. His most recent blood work is now back to normal. The patient has been offered a colonoscopy because he hasn't had one in many years.The patient and his wife are hesitant to undergo a colonoscopy at this time. The patient and his wife reportedly will call me and set up a colonoscopy it he decides to go ahead with the procedure.   Note: This dictation was prepared with Dragon dictation along with smaller phrase technology. Any transcriptional errors that result from this process are unintentional.

## 2015-11-29 ENCOUNTER — Ambulatory Visit: Payer: Medicare Other | Admitting: Family Medicine

## 2015-11-30 ENCOUNTER — Encounter: Payer: Self-pay | Admitting: Family Medicine

## 2015-11-30 ENCOUNTER — Ambulatory Visit (INDEPENDENT_AMBULATORY_CARE_PROVIDER_SITE_OTHER): Payer: Medicare Other | Admitting: Family Medicine

## 2015-11-30 VITALS — BP 124/70 | HR 62 | Temp 97.8°F | Ht 68.7 in | Wt 168.0 lb

## 2015-11-30 DIAGNOSIS — E119 Type 2 diabetes mellitus without complications: Secondary | ICD-10-CM

## 2015-11-30 DIAGNOSIS — I10 Essential (primary) hypertension: Secondary | ICD-10-CM | POA: Diagnosis not present

## 2015-11-30 DIAGNOSIS — R55 Syncope and collapse: Secondary | ICD-10-CM | POA: Diagnosis not present

## 2015-11-30 DIAGNOSIS — D692 Other nonthrombocytopenic purpura: Secondary | ICD-10-CM

## 2015-11-30 LAB — BAYER DCA HB A1C WAIVED: HB A1C (BAYER DCA - WAIVED): 7.4 % — ABNORMAL HIGH (ref <7.0)

## 2015-11-30 NOTE — Progress Notes (Signed)
BP 124/70 mmHg  Pulse 62  Temp(Src) 97.8 F (36.6 C)  Ht 5' 8.7" (1.745 m)  Wt 168 lb (76.204 kg)  BMI 25.03 kg/m2  SpO2 98%   Subjective:    Patient ID: Paul Parker, male    DOB: 08-03-1941, 75 y.o.   MRN: [id number]  HPI: Paul Parker is a 75 y.o. male  Chief Complaint  Patient presents with  . Diabetes  . patient fell 11/17/15   patient for diabetes recheck has done well with no complaints from medications noted low blood sugar spells taking medications faithfully. Blood pressure cholesterol medicines no side effects no problems taking medications faithfully Patient taking an eroded down patient run without problems no chest pain chest tightness  Patient had an episode of December 23 of falling and hitting his head and side. Patient was unconscious momentarily has no memory of the event this happened right after seeing his dog had died. And became very emotional. Afterwards patient was okay with no other complaints no other complaints of chest pain chest symptoms and is been fine since with no further lightheaded spells. Has some bruising on his flank and an side. Had a bruise on his head also.   Relevant past medical, surgical, family and social history reviewed and updated as indicated. Interim medical history since our last visit reviewed. Allergies and medications reviewed and updated.  Review of Systems  Constitutional: Negative.   Respiratory: Negative.   Cardiovascular: Negative.     Per HPI unless specifically indicated above     Objective:    BP 124/70 mmHg  Pulse 62  Temp(Src) 97.8 F (36.6 C)  Ht 5' 8.7" (1.745 m)  Wt 168 lb (76.204 kg)  BMI 25.03 kg/m2  SpO2 98%  Wt Readings from Last 3 Encounters:  11/30/15 168 lb (76.204 kg)  09/27/15 176 lb (79.833 kg)  08/30/15 175 lb (79.379 kg)    Physical Exam  Constitutional: He is oriented to person, place, and time. He appears well-developed and well-nourished. No distress.  HENT:  Head: Normocephalic and  atraumatic.  Right Ear: Hearing normal.  Left Ear: Hearing normal.  Nose: Nose normal.  Eyes: Conjunctivae and lids are normal. Right eye exhibits no discharge. Left eye exhibits no discharge. No scleral icterus.  Cardiovascular: Normal rate, regular rhythm and normal heart sounds.   Pulmonary/Chest: Effort normal and breath sounds normal. No respiratory distress.  Abdominal: Soft.  Musculoskeletal: Normal range of motion.  Neurological: He is alert and oriented to person, place, and time.  Skin: Skin is intact. No rash noted.  Psychiatric: He has a normal mood and affect. His speech is normal and behavior is normal. Judgment and thought content normal. Cognition and memory are normal.    Results for orders placed or performed in visit on 09/06/15  CBC with Differential/Platelet  Result Value Ref Range   WBC 5.0 3.4 - 10.8 x10E3/uL   RBC 4.28 4.14 - 5.80 x10E6/uL   Hemoglobin 12.7 12.6 - 17.7 g/dL   Hematocrit 39.3 37.5 - 51.0 %   MCV 92 79 - 97 fL   MCH 29.7 26.6 - 33.0 pg   MCHC 32.3 31.5 - 35.7 g/dL   RDW 14.9 12.3 - 15.4 %   Platelets 165 150 - 379 x10E3/uL   Neutrophils 59 %   Lymphs 31 %   Monocytes 9 %   Eos 1 %   Basos 0 %   Neutrophils Absolute 2.9 1.4 - 7.0 x10E3/uL   Lymphocytes Absolute 1.6  0.7 - 3.1 x10E3/uL   Monocytes Absolute 0.5 0.1 - 0.9 x10E3/uL   EOS (ABSOLUTE) 0.1 0.0 - 0.4 x10E3/uL   Basophils Absolute 0.0 0.0 - 0.2 x10E3/uL   Immature Granulocytes 0 %   Immature Grans (Abs) 0.0 0.0 - 0.1 x10E3/uL  Reticulocytes  Result Value Ref Range   Retic Ct Pct 1.4 0.6 - 2.6 %  Ferritin  Result Value Ref Range   Ferritin 24 (L) 30 - 400 ng/mL  Iron and TIBC  Result Value Ref Range   Total Iron Binding Capacity 428 250 - 450 ug/dL   UIBC 363 (H) 111 - 343 ug/dL   Iron 65 38 - 169 ug/dL   Iron Saturation 15 15 - 55 %      Assessment & Plan:   Problem List Items Addressed This Visit      Cardiovascular and Mediastinum   Syncope    Reviewed EKG with  pacemaker with pacemaker sinking and pacing Discussed syncope most likely vasovagal will observe for further symptoms      Relevant Orders   EKG 12-Lead (Completed)   Senile purpura (HCC)    stable      Hypertension    The current medical regimen is effective;  continue present plan and medications.         Endocrine   Diabetes mellitus without complication ([place]) - Primary    Good control with hemoglobin A1c of 7.4 not wanting to low because of falling risk      Relevant Orders   Bayer DCA Hb A1c Waived       Follow up plan: Return in about 3 months (around 02/28/2016) for Medication follow-up, and hemoglobin A1c, BMP, lipids, ALT, AST.

## 2015-11-30 NOTE — Assessment & Plan Note (Signed)
Reviewed EKG with pacemaker with pacemaker sinking and pacing Discussed syncope most likely vasovagal will observe for further symptoms

## 2015-11-30 NOTE — Assessment & Plan Note (Signed)
The current medical regimen is effective;  continue present plan and medications.  

## 2015-11-30 NOTE — Assessment & Plan Note (Signed)
Good control with hemoglobin A1c of 7.4 not wanting to low because of falling risk

## 2015-11-30 NOTE — Assessment & Plan Note (Signed)
stable °

## 2015-12-06 LAB — HM DIABETES EYE EXAM

## 2015-12-29 DIAGNOSIS — Z950 Presence of cardiac pacemaker: Secondary | ICD-10-CM | POA: Diagnosis not present

## 2015-12-29 DIAGNOSIS — I499 Cardiac arrhythmia, unspecified: Secondary | ICD-10-CM | POA: Diagnosis not present

## 2016-01-22 DIAGNOSIS — E782 Mixed hyperlipidemia: Secondary | ICD-10-CM | POA: Diagnosis not present

## 2016-01-22 DIAGNOSIS — I2510 Atherosclerotic heart disease of native coronary artery without angina pectoris: Secondary | ICD-10-CM | POA: Diagnosis not present

## 2016-01-22 DIAGNOSIS — I4891 Unspecified atrial fibrillation: Secondary | ICD-10-CM | POA: Diagnosis not present

## 2016-01-22 DIAGNOSIS — I4892 Unspecified atrial flutter: Secondary | ICD-10-CM | POA: Diagnosis not present

## 2016-01-22 DIAGNOSIS — I10 Essential (primary) hypertension: Secondary | ICD-10-CM | POA: Diagnosis not present

## 2016-01-22 DIAGNOSIS — I739 Peripheral vascular disease, unspecified: Secondary | ICD-10-CM | POA: Diagnosis not present

## 2016-01-22 DIAGNOSIS — I25810 Atherosclerosis of coronary artery bypass graft(s) without angina pectoris: Secondary | ICD-10-CM | POA: Diagnosis not present

## 2016-03-06 ENCOUNTER — Encounter: Payer: Self-pay | Admitting: Family Medicine

## 2016-03-06 ENCOUNTER — Ambulatory Visit (INDEPENDENT_AMBULATORY_CARE_PROVIDER_SITE_OTHER): Payer: Medicare Other | Admitting: Family Medicine

## 2016-03-06 VITALS — BP 110/69 | HR 71 | Temp 98.6°F | Ht 68.2 in | Wt 173.0 lb

## 2016-03-06 DIAGNOSIS — E785 Hyperlipidemia, unspecified: Secondary | ICD-10-CM

## 2016-03-06 DIAGNOSIS — I25111 Atherosclerotic heart disease of native coronary artery with angina pectoris with documented spasm: Secondary | ICD-10-CM

## 2016-03-06 DIAGNOSIS — E119 Type 2 diabetes mellitus without complications: Secondary | ICD-10-CM | POA: Diagnosis not present

## 2016-03-06 DIAGNOSIS — I10 Essential (primary) hypertension: Secondary | ICD-10-CM

## 2016-03-06 LAB — LP+ALT+AST PICCOLO, WAIVED
ALT (SGPT) Piccolo, Waived: 22 U/L (ref 10–47)
AST (SGOT) Piccolo, Waived: 33 U/L (ref 11–38)
Chol/HDL Ratio Piccolo,Waive: 2.3 mg/dL
Cholesterol Piccolo, Waived: 129 mg/dL (ref <200)
HDL Chol Piccolo, Waived: 57 mg/dL — ABNORMAL LOW (ref >59)
LDL Chol Calc Piccolo Waived: 47 mg/dL (ref <100)
Triglycerides Piccolo,Waived: 124 mg/dL (ref <150)
VLDL Chol Calc Piccolo,Waive: 25 mg/dL (ref <30)

## 2016-03-06 LAB — BAYER DCA HB A1C WAIVED: HB A1C (BAYER DCA - WAIVED): 7.5 % — ABNORMAL HIGH (ref <7.0)

## 2016-03-06 NOTE — Assessment & Plan Note (Addendum)
The current medical regimen is effective;  continue present plan and medications. Discuss A1c elevated but at current age that's okay.

## 2016-03-06 NOTE — Assessment & Plan Note (Signed)
The current medical regimen is effective;  continue present plan and medications.  

## 2016-03-06 NOTE — Progress Notes (Signed)
BP 110/69 mmHg  Pulse 71  Temp(Src) 98.6 F (37 C)  Ht 5' 8.2" (1.732 m)  Wt 173 lb (78.472 kg)  BMI 26.16 kg/m2  SpO2 96%   Subjective:    Patient ID: Paul Parker, male    DOB: September 14, 1941, 75 y.o.   MRN: [id number]  HPI: Paul Parker is a 75 y.o. male  Chief Complaint  Patient presents with  . Diabetes  . Hyperlipidemia  . Hypertension   recheck medical problems has been doing well with no complaints accompanied by his wife. Taking medications faithfully with no side effects. No low blood sugar spells. Diabetes cholesterol blood pressure on doing well.  No cardiac symptoms has pacemaker scheduled to be checked in a week or so. Relevant past medical, surgical, family and social history reviewed and updated as indicated. Interim medical history since our last visit reviewed. Allergies and medications reviewed and updated.  Review of Systems  Constitutional: Negative.   Respiratory: Negative.   Cardiovascular: Negative.     Per HPI unless specifically indicated above     Objective:    BP 110/69 mmHg  Pulse 71  Temp(Src) 98.6 F (37 C)  Ht 5' 8.2" (1.732 m)  Wt 173 lb (78.472 kg)  BMI 26.16 kg/m2  SpO2 96%  Wt Readings from Last 3 Encounters:  03/06/16 173 lb (78.472 kg)  11/30/15 168 lb (76.204 kg)  09/27/15 176 lb (79.833 kg)    Physical Exam  Constitutional: He is oriented to person, place, and time. He appears well-developed and well-nourished. No distress.  HENT:  Head: Normocephalic and atraumatic.  Right Ear: Hearing normal.  Left Ear: Hearing normal.  Nose: Nose normal.  Eyes: Conjunctivae and lids are normal. Right eye exhibits no discharge. Left eye exhibits no discharge. No scleral icterus.  Cardiovascular: Normal rate, regular rhythm and normal heart sounds.   Pulmonary/Chest: Effort normal and breath sounds normal. No respiratory distress.  Musculoskeletal: Normal range of motion.  Neurological: He is alert and oriented to person, place, and time.   Skin: Skin is intact. No rash noted.  Psychiatric: He has a normal mood and affect. His speech is normal and behavior is normal. Judgment and thought content normal. Cognition and memory are normal.    Results for orders placed or performed in visit on 03/06/16  HM DIABETES EYE EXAM  Result Value Ref Range   HM Diabetic Eye Exam No Retinopathy No Retinopathy      Assessment & Plan:   Problem List Items Addressed This Visit      Cardiovascular and Mediastinum   Hypertension    The current medical regimen is effective;  continue present plan and medications.       Relevant Orders   Basic metabolic panel   CAD (coronary artery disease)    The current medical regimen is effective;  continue present plan and medications.         Endocrine   Diabetes mellitus without complication ([place]) - Primary    The current medical regimen is effective;  continue present plan and medications. Discuss A1c elevated but at current age that's okay.      Relevant Orders   Bayer DCA Hb A1c Waived     Other   Hyperlipidemia    The current medical regimen is effective;  continue present plan and medications.       Relevant Orders   LP+ALT+AST [place], Waived       Follow up plan: Return in about 3 months (around  06/05/2016) for A1c.

## 2016-03-07 ENCOUNTER — Encounter: Payer: Self-pay | Admitting: Family Medicine

## 2016-03-07 LAB — BASIC METABOLIC PANEL
BUN/Creatinine Ratio: 18 (ref 10–24)
BUN: 15 mg/dL (ref 8–27)
CO2: 25 mmol/L (ref 18–29)
Calcium: 9.6 mg/dL (ref 8.6–10.2)
Chloride: 101 mmol/L (ref 96–106)
Creatinine, Ser: 0.84 mg/dL (ref 0.76–1.27)
GFR calc Af Amer: 99 mL/min/{1.73_m2} (ref >59)
GFR calc non Af Amer: 86 mL/min/{1.73_m2} (ref >59)
Glucose: 116 mg/dL — ABNORMAL HIGH (ref 65–99)
Potassium: 4.9 mmol/L (ref 3.5–5.2)
Sodium: 141 mmol/L (ref 134–144)

## 2016-03-14 DIAGNOSIS — E119 Type 2 diabetes mellitus without complications: Secondary | ICD-10-CM | POA: Diagnosis not present

## 2016-03-20 ENCOUNTER — Other Ambulatory Visit: Payer: Self-pay | Admitting: Family Medicine

## 2016-03-20 DIAGNOSIS — E119 Type 2 diabetes mellitus without complications: Secondary | ICD-10-CM | POA: Diagnosis not present

## 2016-03-21 ENCOUNTER — Telehealth: Payer: Self-pay | Admitting: Family Medicine

## 2016-03-21 NOTE — Telephone Encounter (Signed)
ONETOUCH DELICA LANCETS [id number] MISC Pharmacy: [facility] [place], [place]  Patient needs refill on his one touch delica lancets send to his pharmacy, thanks.

## 2016-03-28 DIAGNOSIS — I25701 Atherosclerosis of coronary artery bypass graft(s), unspecified, with angina pectoris with documented spasm: Secondary | ICD-10-CM | POA: Diagnosis not present

## 2016-03-28 DIAGNOSIS — Z950 Presence of cardiac pacemaker: Secondary | ICD-10-CM | POA: Diagnosis not present

## 2016-03-28 DIAGNOSIS — S40011A Contusion of right shoulder, initial encounter: Secondary | ICD-10-CM | POA: Diagnosis not present

## 2016-05-27 ENCOUNTER — Other Ambulatory Visit: Payer: Self-pay | Admitting: Family Medicine

## 2016-06-20 ENCOUNTER — Ambulatory Visit (INDEPENDENT_AMBULATORY_CARE_PROVIDER_SITE_OTHER): Payer: Medicare Other | Admitting: Family Medicine

## 2016-06-20 ENCOUNTER — Encounter: Payer: Self-pay | Admitting: Family Medicine

## 2016-06-20 VITALS — BP 113/67 | HR 60 | Temp 98.2°F | Ht 68.7 in | Wt 173.0 lb

## 2016-06-20 DIAGNOSIS — I10 Essential (primary) hypertension: Secondary | ICD-10-CM

## 2016-06-20 DIAGNOSIS — Z23 Encounter for immunization: Secondary | ICD-10-CM

## 2016-06-20 DIAGNOSIS — I25111 Atherosclerotic heart disease of native coronary artery with angina pectoris with documented spasm: Secondary | ICD-10-CM

## 2016-06-20 DIAGNOSIS — E785 Hyperlipidemia, unspecified: Secondary | ICD-10-CM

## 2016-06-20 DIAGNOSIS — E119 Type 2 diabetes mellitus without complications: Secondary | ICD-10-CM | POA: Diagnosis not present

## 2016-06-20 LAB — BAYER DCA HB A1C WAIVED: HB A1C (BAYER DCA - WAIVED): 7.4 % — ABNORMAL HIGH (ref <7.0)

## 2016-06-20 NOTE — Assessment & Plan Note (Signed)
The current medical regimen is effective;  continue present plan and medications.  

## 2016-06-20 NOTE — Patient Instructions (Addendum)
Tdap Vaccine (Tetanus, Diphtheria and Pertussis): What You Need to Know 1. Why get vaccinated? Tetanus, diphtheria and pertussis are very serious diseases. Tdap vaccine can protect us from these diseases. And, Tdap vaccine given to pregnant women can protect newborn babies against pertussis. TETANUS (Lockjaw) is rare in the [place] today. It causes painful muscle tightening and stiffness, usually all over the body.  It can lead to tightening of muscles in the head and neck so you can't open your mouth, swallow, or sometimes even breathe. Tetanus kills about 1 out of 10 people who are infected even after receiving the best medical care. DIPHTHERIA is also rare in the [place] today. It can cause a thick coating to form in the back of the throat.  It can lead to breathing problems, heart failure, paralysis, and death. PERTUSSIS (Whooping Cough) causes severe coughing spells, which can cause difficulty breathing, vomiting and disturbed sleep.  It can also lead to weight loss, incontinence, and rib fractures. Up to 2 in 100 adolescents and 5 in 100 adults with pertussis are hospitalized or have complications, which could include pneumonia or death. These diseases are caused by bacteria. Diphtheria and pertussis are spread from person to person through secretions from coughing or sneezing. Tetanus enters the body through cuts, scratches, or wounds. Before vaccines, as many as 200,000 cases of diphtheria, 200,000 cases of pertussis, and hundreds of cases of tetanus, were reported in the [place] each year. Since vaccination began, reports of cases for tetanus and diphtheria have dropped by about 99% and for pertussis by about 80%. 2. Tdap vaccine Tdap vaccine can protect adolescents and adults from tetanus, diphtheria, and pertussis. One dose of Tdap is routinely given at age 11 or 12. People who did not get Tdap at that age should get it as soon as possible. Tdap is especially important  for healthcare professionals and anyone having close contact with a baby younger than 12 months. Pregnant women should get a dose of Tdap during every pregnancy, to protect the newborn from pertussis. Infants are most at risk for severe, life-threatening complications from pertussis. Another vaccine, called Td, protects against tetanus and diphtheria, but not pertussis. A Td booster should be given every 10 years. Tdap may be given as one of these boosters if you have never gotten Tdap before. Tdap may also be given after a severe cut or burn to prevent tetanus infection. Your doctor or the person giving you the vaccine can give you more information. Tdap may safely be given at the same time as other vaccines. 3. Some people should not get this vaccine  A person who has ever had a life-threatening allergic reaction after a previous dose of any diphtheria, tetanus or pertussis containing vaccine, OR has a severe allergy to any part of this vaccine, should not get Tdap vaccine. Tell the person giving the vaccine about any severe allergies.  Anyone who had coma or long repeated seizures within 7 days after a childhood dose of DTP or DTaP, or a previous dose of Tdap, should not get Tdap, unless a cause other than the vaccine was found. They can still get Td.  Talk to your doctor if you:  have seizures or another nervous system problem,  had severe pain or swelling after any vaccine containing diphtheria, tetanus or pertussis,  ever had a condition called Guillain-Barr Syndrome (GBS),  aren't feeling well on the day the shot is scheduled. 4. Risks With any medicine, including vaccines, there is   a chance of side effects. These are usually mild and go away on their own. Serious reactions are also possible but are rare. Most people who get Tdap vaccine do not have any problems with it. Mild problems following Tdap (Did not interfere with activities)  Pain where the shot was given (about 3 in 4  adolescents or 2 in 3 adults)  Redness or swelling where the shot was given (about 1 person in 5)  Mild fever of at least 100.4F (up to about 1 in 25 adolescents or 1 in 100 adults)  Headache (about 3 or 4 people in 10)  Tiredness (about 1 person in 3 or 4)  Nausea, vomiting, diarrhea, stomach ache (up to 1 in 4 adolescents or 1 in 10 adults)  Chills, sore joints (about 1 person in 10)  Body aches (about 1 person in 3 or 4)  Rash, swollen glands (uncommon) Moderate problems following Tdap (Interfered with activities, but did not require medical attention)  Pain where the shot was given (up to 1 in 5 or 6)  Redness or swelling where the shot was given (up to about 1 in 16 adolescents or 1 in 12 adults)  Fever over 102F (about 1 in 100 adolescents or 1 in 250 adults)  Headache (about 1 in 7 adolescents or 1 in 10 adults)  Nausea, vomiting, diarrhea, stomach ache (up to 1 or 3 people in 100)  Swelling of the entire arm where the shot was given (up to about 1 in 500). Severe problems following Tdap (Unable to perform usual activities; required medical attention)  Swelling, severe pain, bleeding and redness in the arm where the shot was given (rare). Problems that could happen after any vaccine:  People sometimes faint after a medical procedure, including vaccination. Sitting or lying down for about 15 minutes can help prevent fainting, and injuries caused by a fall. Tell your doctor if you feel dizzy, or have vision changes or ringing in the ears.  Some people get severe pain in the shoulder and have difficulty moving the arm where a shot was given. This happens very rarely.  Any medication can cause a severe allergic reaction. Such reactions from a vaccine are very rare, estimated at fewer than 1 in a million doses, and would happen within a few minutes to a few hours after the vaccination. As with any medicine, there is a very remote chance of a vaccine causing a serious  injury or death. The safety of vaccines is always being monitored. For more information, visit: www.cdc.gov/vaccinesafety/ 5. What if there is a serious problem? What should I look for?  Look for anything that concerns you, such as signs of a severe allergic reaction, very high fever, or unusual behavior.  Signs of a severe allergic reaction can include hives, swelling of the face and throat, difficulty breathing, a fast heartbeat, dizziness, and weakness. These would usually start a few minutes to a few hours after the vaccination. What should I do?  If you think it is a severe allergic reaction or other emergency that can't wait, call 9-1-1 or get the person to the nearest hospital. Otherwise, call your doctor.  Afterward, the reaction should be reported to the Vaccine Adverse Event Reporting System (VAERS). Your doctor might file this report, or you can do it yourself through the VAERS web site at www.vaers.hhs.gov, or by calling 1-800-822-7967. VAERS does not give medical advice.  6. The National Vaccine Injury Compensation Program The National Vaccine Injury Compensation Program (  VICP) is a federal program that was created to compensate people who may have been injured by certain vaccines. Persons who believe they may have been injured by a vaccine can learn about the program and about filing a claim by calling 1-800-338-2382 or visiting the VICP website at www.hrsa.gov/vaccinecompensation. There is a time limit to file a claim for compensation. 7. How can I learn more?  Ask your doctor. He or she can give you the vaccine package insert or suggest other sources of information.  Call your local or state health department.  Contact the Centers for Disease Control and Prevention (CDC):  Call 1-800-232-4636 (1-800-CDC-INFO) or  Visit CDC's website at www.cdc.gov/vaccines CDC Tdap Vaccine VIS (01/18/14)   This information is not intended to replace advice given to you by your health care  provider. Make sure you discuss any questions you have with your health care provider.   Document Released: 05/12/2012 Document Revised: 12/02/2014 Document Reviewed: 02/23/2014 Elsevier Interactive Patient Education 2016 [employer]  

## 2016-06-20 NOTE — Progress Notes (Signed)
BP 113/67 (BP Location: Left Arm, Patient Position: Sitting, Cuff Size: Normal)   Pulse 60   Temp 98.2 F (36.8 C)   Ht 5' 8.7" (1.745 m) Comment: with shoes  Wt 173 lb (78.5 kg) Comment: with shoes  SpO2 98%   BMI 25.77 kg/m    Subjective:    Patient ID: Paul Parker, male    DOB: January 05, 1941, 75 y.o.   MRN: [id number]  HPI: Paul Parker is a 75 y.o. male  Patient follow-up diabetes doing well noted low blood sugar spells no issues with medications taken faithfully Blood pressure doing well no complaints from medications Cholesterol and other medical issues all stable. Accompanied by wife who also assists with history.  Relevant past medical, surgical, family and social history reviewed and updated as indicated. Interim medical history since our last visit reviewed. Allergies and medications reviewed and updated.  Review of Systems  Constitutional: Negative.   Respiratory: Negative.   Cardiovascular: Negative.     Per HPI unless specifically indicated above     Objective:    BP 113/67 (BP Location: Left Arm, Patient Position: Sitting, Cuff Size: Normal)   Pulse 60   Temp 98.2 F (36.8 C)   Ht 5' 8.7" (1.745 m) Comment: with shoes  Wt 173 lb (78.5 kg) Comment: with shoes  SpO2 98%   BMI 25.77 kg/m   Wt Readings from Last 3 Encounters:  06/20/16 173 lb (78.5 kg)  03/06/16 173 lb (78.5 kg)  11/30/15 168 lb (76.2 kg)    Physical Exam  Constitutional: He is oriented to person, place, and time. He appears well-developed and well-nourished. No distress.  HENT:  Head: Normocephalic and atraumatic.  Right Ear: Hearing normal.  Left Ear: Hearing normal.  Nose: Nose normal.  Eyes: Conjunctivae and lids are normal. Right eye exhibits no discharge. Left eye exhibits no discharge. No scleral icterus.  Cardiovascular: Normal rate, regular rhythm and normal heart sounds.   Pulmonary/Chest: Effort normal and breath sounds normal. No respiratory distress.  Musculoskeletal: Normal  range of motion.  Neurological: He is alert and oriented to person, place, and time.  Skin: Skin is intact. No rash noted.  Psychiatric: He has a normal mood and affect. His speech is normal and behavior is normal. Judgment and thought content normal. Cognition and memory are normal.    Results for orders placed or performed in visit on 03/06/16  Bayer DCA Hb A1c Waived  Result Value Ref Range   Bayer DCA Hb A1c Waived 7.5 (H) <7.0 %  LP+ALT+AST Piccolo, Waived  Result Value Ref Range   ALT (SGPT) Piccolo, Waived 22 10 - 47 U/L   AST (SGOT) Piccolo, Waived 33 11 - 38 U/L   Cholesterol Piccolo, Waived 129 <200 mg/dL   HDL Chol Piccolo, Waived 57 (L) >59 mg/dL   Triglycerides Piccolo,Waived 124 <150 mg/dL   Chol/HDL Ratio Piccolo,Waive 2.3 mg/dL   LDL Chol Calc Piccolo Waived 47 <100 mg/dL   VLDL Chol Calc Piccolo,Waive 25 <30 mg/dL  Basic metabolic panel  Result Value Ref Range   Glucose 116 (H) 65 - 99 mg/dL   BUN 15 8 - 27 mg/dL   Creatinine, Ser 0.84 0.76 - 1.27 mg/dL   GFR calc non Af Amer 86 >59 mL/min/1.73   GFR calc Af Amer 99 >59 mL/min/1.73   BUN/Creatinine Ratio 18 10 - 24   Sodium 141 134 - 144 mmol/L   Potassium 4.9 3.5 - 5.2 mmol/L   Chloride 101 96 -  106 mmol/L   CO2 25 18 - 29 mmol/L   Calcium 9.6 8.6 - 10.2 mg/dL      Assessment & Plan:   Problem List Items Addressed This Visit      Cardiovascular and Mediastinum   Hypertension    The current medical regimen is effective;  continue present plan and medications.       CAD (coronary artery disease)    The current medical regimen is effective;  continue present plan and medications.         Endocrine   Diabetes mellitus without complication ([place]) - Primary    The current medical regimen is effective;  continue present plan and medications.       Relevant Orders   Bayer DCA Hb A1c Waived     Other   Hyperlipidemia    The current medical regimen is effective;  continue present plan and  medications.        Other Visit Diagnoses    Need for Tdap vaccination       Relevant Orders   Tdap vaccine greater than or equal to 7yo IM (Completed)       Follow up plan: Return in about 3 months (around 09/20/2016) for Physical Exam A1c.

## 2016-06-27 DIAGNOSIS — I499 Cardiac arrhythmia, unspecified: Secondary | ICD-10-CM | POA: Diagnosis not present

## 2016-06-27 DIAGNOSIS — I25701 Atherosclerosis of coronary artery bypass graft(s), unspecified, with angina pectoris with documented spasm: Secondary | ICD-10-CM | POA: Diagnosis not present

## 2016-06-27 DIAGNOSIS — Z950 Presence of cardiac pacemaker: Secondary | ICD-10-CM | POA: Diagnosis not present

## 2016-07-08 DIAGNOSIS — E782 Mixed hyperlipidemia: Secondary | ICD-10-CM | POA: Diagnosis not present

## 2016-07-08 DIAGNOSIS — I10 Essential (primary) hypertension: Secondary | ICD-10-CM | POA: Diagnosis not present

## 2016-07-08 DIAGNOSIS — I4891 Unspecified atrial fibrillation: Secondary | ICD-10-CM | POA: Diagnosis not present

## 2016-07-08 DIAGNOSIS — I2510 Atherosclerotic heart disease of native coronary artery without angina pectoris: Secondary | ICD-10-CM | POA: Diagnosis not present

## 2016-07-08 DIAGNOSIS — I739 Peripheral vascular disease, unspecified: Secondary | ICD-10-CM | POA: Diagnosis not present

## 2016-07-15 ENCOUNTER — Other Ambulatory Visit: Payer: Self-pay | Admitting: Family Medicine

## 2016-09-04 ENCOUNTER — Ambulatory Visit (INDEPENDENT_AMBULATORY_CARE_PROVIDER_SITE_OTHER): Payer: Medicare Other

## 2016-09-04 DIAGNOSIS — Z23 Encounter for immunization: Secondary | ICD-10-CM

## 2016-09-09 ENCOUNTER — Other Ambulatory Visit: Payer: Self-pay | Admitting: Family Medicine

## 2016-09-16 ENCOUNTER — Other Ambulatory Visit: Payer: Self-pay | Admitting: Family Medicine

## 2016-09-26 DIAGNOSIS — I499 Cardiac arrhythmia, unspecified: Secondary | ICD-10-CM | POA: Diagnosis not present

## 2016-09-26 DIAGNOSIS — Z950 Presence of cardiac pacemaker: Secondary | ICD-10-CM | POA: Diagnosis not present

## 2016-09-26 DIAGNOSIS — I25701 Atherosclerosis of coronary artery bypass graft(s), unspecified, with angina pectoris with documented spasm: Secondary | ICD-10-CM | POA: Diagnosis not present

## 2016-09-30 ENCOUNTER — Other Ambulatory Visit: Payer: Self-pay | Admitting: Family Medicine

## 2016-09-30 NOTE — Telephone Encounter (Signed)
Routing to provider  

## 2016-10-09 ENCOUNTER — Encounter: Payer: Self-pay | Admitting: Family Medicine

## 2016-10-09 ENCOUNTER — Ambulatory Visit (INDEPENDENT_AMBULATORY_CARE_PROVIDER_SITE_OTHER): Payer: Medicare Other | Admitting: Family Medicine

## 2016-10-09 VITALS — BP 135/70 | HR 83 | Temp 98.2°F | Ht 68.3 in | Wt 171.8 lb

## 2016-10-09 DIAGNOSIS — E785 Hyperlipidemia, unspecified: Secondary | ICD-10-CM

## 2016-10-09 DIAGNOSIS — D692 Other nonthrombocytopenic purpura: Secondary | ICD-10-CM | POA: Diagnosis not present

## 2016-10-09 DIAGNOSIS — I10 Essential (primary) hypertension: Secondary | ICD-10-CM

## 2016-10-09 DIAGNOSIS — N400 Enlarged prostate without lower urinary tract symptoms: Secondary | ICD-10-CM | POA: Diagnosis not present

## 2016-10-09 DIAGNOSIS — E119 Type 2 diabetes mellitus without complications: Secondary | ICD-10-CM | POA: Diagnosis not present

## 2016-10-09 DIAGNOSIS — Z0000 Encounter for general adult medical examination without abnormal findings: Secondary | ICD-10-CM | POA: Diagnosis not present

## 2016-10-09 DIAGNOSIS — F3342 Major depressive disorder, recurrent, in full remission: Secondary | ICD-10-CM | POA: Diagnosis not present

## 2016-10-09 DIAGNOSIS — I25111 Atherosclerotic heart disease of native coronary artery with angina pectoris with documented spasm: Secondary | ICD-10-CM

## 2016-10-09 LAB — CBC
Hematocrit: 40.1 % (ref 37.5–51.0)
Hemoglobin: 12.7 g/dL (ref 12.6–17.7)
MCH: 30.7 pg (ref 26.6–33.0)
MCHC: 31.7 g/dL (ref 31.5–35.7)
MCV: 97 fL (ref 79–97)
Platelets: 134 10*3/uL — ABNORMAL LOW (ref 150–379)
RBC: 4.14 x10E6/uL (ref 4.14–5.80)
RDW: 15 % (ref 12.3–15.4)
WBC: 7.9 10*3/uL (ref 3.4–10.8)

## 2016-10-09 LAB — UA/M W/RFLX CULTURE, ROUTINE
Bilirubin, UA: NEGATIVE
Ketones, UA: NEGATIVE
Leukocytes, UA: NEGATIVE
Nitrite, UA: NEGATIVE
Protein, UA: NEGATIVE
Specific Gravity, UA: 1.02 (ref 1.005–1.030)
Urobilinogen, Ur: 0.2 mg/dL (ref 0.2–1.0)
pH, UA: 5 (ref 5.0–7.5)

## 2016-10-09 LAB — BAYER DCA HB A1C WAIVED: HB A1C (BAYER DCA - WAIVED): 7.4 % — ABNORMAL HIGH (ref <7.0)

## 2016-10-09 LAB — MICROSCOPIC EXAMINATION: Epithelial Cells (non renal): NONE SEEN /hpf (ref 0–10)

## 2016-10-09 MED ORDER — ATORVASTATIN CALCIUM 40 MG PO TABS: 40.0000 mg | ORAL_TABLET | Freq: Every day | ORAL | 4 refills | Status: DC

## 2016-10-09 MED ORDER — METFORMIN HCL 500 MG PO TABS: 1000.0000 mg | ORAL_TABLET | Freq: Two times a day (BID) | ORAL | 4 refills | Status: DC

## 2016-10-09 MED ORDER — CLOPIDOGREL BISULFATE 75 MG PO TABS: 75.0000 mg | ORAL_TABLET | Freq: Every day | ORAL | 4 refills | Status: DC

## 2016-10-09 NOTE — Assessment & Plan Note (Signed)
The current medical regimen is effective;  continue present plan and medications.  

## 2016-10-09 NOTE — Assessment & Plan Note (Signed)
Stable

## 2016-10-09 NOTE — Progress Notes (Signed)
BP 135/70 (BP Location: Left Arm)   Pulse 83   Temp 98.2 F (36.8 C)   Ht 5' 8.3" (1.735 m)   Wt 171 lb 12.8 oz (77.9 kg)   SpO2 97%   BMI 25.89 kg/m    Subjective:    Patient ID: Paul Parker, male    DOB: Nov 09, 1941, 75 y.o.   MRN: [id number]  HPI: Paul Parker is a 75 y.o. male  Annual Well Visit AWV metrics met   Relevant past medical, surgical, family and social history reviewed and updated as indicated. Interim medical history since our last visit reviewed. Allergies and medications reviewed and updated.  Review of Systems  Constitutional: Negative.   HENT: Negative.   Eyes: Negative.   Respiratory: Negative.   Cardiovascular: Negative.   Gastrointestinal: Negative.   Endocrine: Negative.   Genitourinary: Negative.   Musculoskeletal: Negative.   Skin: Negative.   Allergic/Immunologic: Negative.   Neurological: Negative.   Hematological: Negative.   Psychiatric/Behavioral: Negative.     Per HPI unless specifically indicated above     Objective:    BP 135/70 (BP Location: Left Arm)   Pulse 83   Temp 98.2 F (36.8 C)   Ht 5' 8.3" (1.735 m)   Wt 171 lb 12.8 oz (77.9 kg)   SpO2 97%   BMI 25.89 kg/m   Wt Readings from Last 3 Encounters:  10/09/16 171 lb 12.8 oz (77.9 kg)  06/20/16 173 lb (78.5 kg)  03/06/16 173 lb (78.5 kg)    Physical Exam  Constitutional: He is oriented to person, place, and time. He appears well-developed and well-nourished.  HENT:  Head: Normocephalic and atraumatic.  Right Ear: External ear normal.  Left Ear: External ear normal.  Eyes: Conjunctivae and EOM are normal. Pupils are equal, round, and reactive to light.  Neck: Normal range of motion. Neck supple.  Cardiovascular: Normal rate, regular rhythm and intact distal pulses.   Murmur heard. Pulmonary/Chest: Effort normal and breath sounds normal.  Abdominal: Soft. Bowel sounds are normal. There is no splenomegaly or hepatomegaly.  Genitourinary: Rectum normal and penis  normal.  Genitourinary Comments: Prostate enlarged  Musculoskeletal: Normal range of motion.  Neurological: He is alert and oriented to person, place, and time. He has normal reflexes.  Skin: No rash noted. No erythema.  Psychiatric: He has a normal mood and affect. His behavior is normal. Judgment and thought content normal.    Results for orders placed or performed in visit on 06/20/16  Bayer DCA Hb A1c Waived  Result Value Ref Range   Bayer DCA Hb A1c Waived 7.4 (H) <7.0 %      Assessment & Plan:   Problem List Items Addressed This Visit      Cardiovascular and Mediastinum   Hypertension    The current medical regimen is effective;  continue present plan and medications.       Relevant Medications   atorvastatin (LIPITOR) 40 MG tablet   Other Relevant Orders   Comprehensive metabolic panel   TSH   UA/M w/rflx Culture, Routine   CBC   Senile purpura (HCC)    Stable       Relevant Medications   atorvastatin (LIPITOR) 40 MG tablet   CAD (coronary artery disease)    The current medical regimen is effective;  continue present plan and medications.       Relevant Medications   atorvastatin (LIPITOR) 40 MG tablet   Other Relevant Orders   Comprehensive metabolic panel  CBC     Endocrine   Diabetes mellitus without complication ([employer]) - Primary    The current medical regimen is effective;  continue present plan and medications.       Relevant Medications   atorvastatin (LIPITOR) 40 MG tablet   metFORMIN (GLUCOPHAGE) 500 MG tablet   Other Relevant Orders   Bayer DCA Hb A1c Waived   Comprehensive metabolic panel   UA/M w/rflx Culture, Routine   CBC     Genitourinary   BPH (benign prostatic hyperplasia)    The current medical regimen is effective;  continue present plan and medications.       Relevant Orders   Comprehensive metabolic panel   PSA     Other   Hyperlipidemia    The current medical regimen is effective;  continue present plan and  medications.       Relevant Medications   atorvastatin (LIPITOR) 40 MG tablet   Other Relevant Orders   Comprehensive metabolic panel   Lipid Panel w/o Chol/HDL Ratio   CBC   Depression    The current medical regimen is effective;  continue present plan and medications.           Follow up plan: Return for BMP,  Lipids, ALT, AST.

## 2016-10-10 ENCOUNTER — Telehealth: Payer: Self-pay | Admitting: Family Medicine

## 2016-10-10 DIAGNOSIS — R7989 Other specified abnormal findings of blood chemistry: Secondary | ICD-10-CM

## 2016-10-10 DIAGNOSIS — R972 Elevated prostate specific antigen [PSA]: Secondary | ICD-10-CM

## 2016-10-10 LAB — LIPID PANEL W/O CHOL/HDL RATIO
Cholesterol, Total: 124 mg/dL (ref 100–199)
HDL: 56 mg/dL (ref >39)
LDL Calculated: 49 mg/dL (ref 0–99)
Triglycerides: 94 mg/dL (ref 0–149)
VLDL Cholesterol Cal: 19 mg/dL (ref 5–40)

## 2016-10-10 LAB — COMPREHENSIVE METABOLIC PANEL
ALT: 18 IU/L (ref 0–44)
AST: 20 IU/L (ref 0–40)
Albumin/Globulin Ratio: 1.9 (ref 1.2–2.2)
Albumin: 4.5 g/dL (ref 3.5–4.8)
Alkaline Phosphatase: 46 IU/L (ref 39–117)
BUN/Creatinine Ratio: 19 (ref 10–24)
BUN: 19 mg/dL (ref 8–27)
Bilirubin Total: 0.4 mg/dL (ref 0.0–1.2)
CO2: 25 mmol/L (ref 18–29)
Calcium: 9.4 mg/dL (ref 8.6–10.2)
Chloride: 99 mmol/L (ref 96–106)
Creatinine, Ser: 0.98 mg/dL (ref 0.76–1.27)
GFR calc Af Amer: 87 mL/min/{1.73_m2} (ref >59)
GFR calc non Af Amer: 75 mL/min/{1.73_m2} (ref >59)
Globulin, Total: 2.4 g/dL (ref 1.5–4.5)
Glucose: 161 mg/dL — ABNORMAL HIGH (ref 65–99)
Potassium: 5 mmol/L (ref 3.5–5.2)
Sodium: 141 mmol/L (ref 134–144)
Total Protein: 6.9 g/dL (ref 6.0–8.5)

## 2016-10-10 LAB — PSA: Prostate Specific Ag, Serum: 4.7 ng/mL — ABNORMAL HIGH (ref 0.0–4.0)

## 2016-10-10 LAB — TSH: TSH: 4.58 u[IU]/mL — ABNORMAL HIGH (ref 0.450–4.500)

## 2016-10-10 NOTE — Telephone Encounter (Signed)
-----   Message from Moss Mc, [place] sent at 10/10/2016  4:58 PM EST ----- Phone call.

## 2016-10-10 NOTE — Telephone Encounter (Signed)
Phone call Discussed with patient elevated TSH and PSA. Patient with no symptoms will recheck both in a couple of months.

## 2016-11-04 DIAGNOSIS — E119 Type 2 diabetes mellitus without complications: Secondary | ICD-10-CM | POA: Diagnosis not present

## 2016-11-07 DIAGNOSIS — E782 Mixed hyperlipidemia: Secondary | ICD-10-CM | POA: Diagnosis not present

## 2016-11-07 DIAGNOSIS — I2510 Atherosclerotic heart disease of native coronary artery without angina pectoris: Secondary | ICD-10-CM | POA: Diagnosis not present

## 2016-11-07 DIAGNOSIS — I4892 Unspecified atrial flutter: Secondary | ICD-10-CM | POA: Diagnosis not present

## 2016-11-07 DIAGNOSIS — I739 Peripheral vascular disease, unspecified: Secondary | ICD-10-CM | POA: Diagnosis not present

## 2016-11-07 DIAGNOSIS — I25810 Atherosclerosis of coronary artery bypass graft(s) without angina pectoris: Secondary | ICD-10-CM | POA: Diagnosis not present

## 2016-11-07 DIAGNOSIS — I10 Essential (primary) hypertension: Secondary | ICD-10-CM | POA: Diagnosis not present

## 2016-11-07 DIAGNOSIS — I4891 Unspecified atrial fibrillation: Secondary | ICD-10-CM | POA: Diagnosis not present

## 2016-11-19 ENCOUNTER — Ambulatory Visit (INDEPENDENT_AMBULATORY_CARE_PROVIDER_SITE_OTHER): Payer: Medicare Other | Admitting: Family Medicine

## 2016-11-19 ENCOUNTER — Encounter: Payer: Self-pay | Admitting: Family Medicine

## 2016-11-19 ENCOUNTER — Other Ambulatory Visit: Payer: Self-pay | Admitting: Family Medicine

## 2016-11-19 VITALS — BP 135/76 | HR 81 | Temp 97.9°F | Wt 179.0 lb

## 2016-11-19 DIAGNOSIS — J069 Acute upper respiratory infection, unspecified: Secondary | ICD-10-CM | POA: Diagnosis not present

## 2016-11-19 MED ORDER — AZITHROMYCIN 250 MG PO TABS: ORAL_TABLET | ORAL | 0 refills | Status: DC

## 2016-11-19 MED ORDER — HYDROCOD POLST-CPM POLST ER 10-8 MG/5ML PO SUER: 5.0000 mL | Freq: Two times a day (BID) | ORAL | 0 refills | Status: DC | PRN

## 2016-11-19 MED ORDER — BENZONATATE 100 MG PO CAPS: 200.0000 mg | ORAL_CAPSULE | Freq: Three times a day (TID) | ORAL | 0 refills | Status: DC | PRN

## 2016-11-19 MED ORDER — DOXYCYCLINE HYCLATE 100 MG PO TABS: 100.0000 mg | ORAL_TABLET | Freq: Two times a day (BID) | ORAL | 0 refills | Status: DC

## 2016-11-19 NOTE — Progress Notes (Addendum)
   BP 135/76   Pulse 81   Temp 97.9 F (36.6 C)   Wt 179 lb (81.2 kg)   SpO2 96%   BMI 26.98 kg/m    Subjective:    Patient ID: Paul Parker, male    DOB: 1941-04-02, 75 y.o.   MRN: [id number]  HPI: Paul Parker is a 75 y.o. male  Chief Complaint  Patient presents with  . URI    x 1 week. Cough, runny nose, head and chest congestion, some sore throat in the beginning. No fever. No ear ache/pressure.    Patient presents with 1 week of cough, nasal congestion, sore throat. Denies fever, ear pain, sinus pain, wheezing, or SOB.Taking tylenol and robitussin with minimal relief. No sick contacts.   Relevant past medical, surgical, family and social history reviewed and updated as indicated. Interim medical history since our last visit reviewed. Allergies and medications reviewed and updated.  Review of Systems  Constitutional: Negative.   HENT: Positive for congestion, rhinorrhea and sore throat.   Eyes: Negative.   Respiratory: Positive for cough.   Cardiovascular: Negative.   Gastrointestinal: Negative.   Genitourinary: Negative.   Musculoskeletal: Negative.   Skin: Negative.   Neurological: Negative.   Psychiatric/Behavioral: Negative.     Per HPI unless specifically indicated above     Objective:    BP 135/76   Pulse 81   Temp 97.9 F (36.6 C)   Wt 179 lb (81.2 kg)   SpO2 96%   BMI 26.98 kg/m   Wt Readings from Last 3 Encounters:  11/19/16 179 lb (81.2 kg)  10/09/16 171 lb 12.8 oz (77.9 kg)  06/20/16 173 lb (78.5 kg)    Physical Exam  Constitutional: He is oriented to person, place, and time. He appears well-developed and well-nourished. No distress.  HENT:  Head: Atraumatic.  Nasal drainage present in b/l nares Oropharynx erythematous without exudates  Eyes: Conjunctivae are normal. Pupils are equal, round, and reactive to light. No scleral icterus.  Neck: Normal range of motion. Neck supple.  Cardiovascular: Normal rate.   Pulmonary/Chest: Effort normal and  breath sounds normal. No respiratory distress. He has no wheezes. He has no rales.  Musculoskeletal: Normal range of motion.  Neurological: He is alert and oriented to person, place, and time.  Skin: Skin is warm and dry.  Psychiatric: He has a normal mood and affect. His behavior is normal.  Nursing note and vitals reviewed.     Assessment & Plan:   Problem List Items Addressed This Visit    None    Visit Diagnoses    Upper respiratory tract infection, unspecified type    -  Primary   Will treat with doxycycline, tessalon perles, and tussionex. Caution given with tussionex. Stay propped up, esp at night. Follow up if no improvement.        Follow up plan: Return if symptoms worsen or fail to improve.

## 2016-11-19 NOTE — Patient Instructions (Signed)
Follow up if no improvement 

## 2016-12-26 DIAGNOSIS — T829XXA Unspecified complication of cardiac and vascular prosthetic device, implant and graft, initial encounter: Secondary | ICD-10-CM | POA: Diagnosis not present

## 2016-12-26 DIAGNOSIS — I25701 Atherosclerosis of coronary artery bypass graft(s), unspecified, with angina pectoris with documented spasm: Secondary | ICD-10-CM | POA: Diagnosis not present

## 2016-12-26 DIAGNOSIS — Z950 Presence of cardiac pacemaker: Secondary | ICD-10-CM | POA: Diagnosis not present

## 2017-01-06 ENCOUNTER — Encounter: Payer: Self-pay | Admitting: Family Medicine

## 2017-01-06 ENCOUNTER — Ambulatory Visit (INDEPENDENT_AMBULATORY_CARE_PROVIDER_SITE_OTHER): Payer: Medicare Other | Admitting: Family Medicine

## 2017-01-06 DIAGNOSIS — M545 Low back pain: Secondary | ICD-10-CM | POA: Diagnosis not present

## 2017-01-06 DIAGNOSIS — I10 Essential (primary) hypertension: Secondary | ICD-10-CM

## 2017-01-06 DIAGNOSIS — E119 Type 2 diabetes mellitus without complications: Secondary | ICD-10-CM | POA: Diagnosis not present

## 2017-01-06 DIAGNOSIS — M549 Dorsalgia, unspecified: Secondary | ICD-10-CM | POA: Insufficient documentation

## 2017-01-06 DIAGNOSIS — G8929 Other chronic pain: Secondary | ICD-10-CM

## 2017-01-06 MED ORDER — MELOXICAM 7.5 MG PO TABS: 7.5000 mg | ORAL_TABLET | Freq: Every day | ORAL | 2 refills | Status: DC

## 2017-01-06 NOTE — Progress Notes (Signed)
BP 137/77   Pulse 64   Ht 6' (1.829 m)   Wt 178 lb 3.2 oz (80.8 kg)   SpO2 97%   BMI 24.17 kg/m    Subjective:    Patient ID: Paul Parker, male    DOB: Sep 06, 1941, 76 y.o.   MRN: [id number]  HPI: Paul Parker is a 76 y.o. male  Chief Complaint  Patient presents with  . Back Pain    Lower left side x 1 week.   Patient with back pain ongoing for about a week or so more on the lower right side no blood in stool or urine has been taking some Tylenol with only minimal relief has been mostly sitting aroundif he trauma or irritation that seemed to cause this pain and discomfort. No noticed rash. Has used a hot pad. No constipation no diarrhea or change in bowels. No issues with blood pressure medicine or cholesterol medicine. Taking Plavix without problems. Heart issues and rhythm stable. Relevant past medical, surgical, family and social history reviewed and updated as indicated. Interim medical history since our last visit reviewed. Allergies and medications reviewed and updated.  Review of Systems  Constitutional: Negative.   Respiratory: Negative.   Cardiovascular: Negative.     Per HPI unless specifically indicated above     Objective:    BP 137/77   Pulse 64   Ht 6' (1.829 m)   Wt 178 lb 3.2 oz (80.8 kg)   SpO2 97%   BMI 24.17 kg/m   Wt Readings from Last 3 Encounters:  01/06/17 178 lb 3.2 oz (80.8 kg)  11/19/16 179 lb (81.2 kg)  10/09/16 171 lb 12.8 oz (77.9 kg)    Physical Exam  Constitutional: He is oriented to person, place, and time. He appears well-developed and well-nourished. No distress.  HENT:  Head: Normocephalic and atraumatic.  Right Ear: Hearing normal.  Left Ear: Hearing normal.  Nose: Nose normal.  Eyes: Conjunctivae and lids are normal. Right eye exhibits no discharge. Left eye exhibits no discharge. No scleral icterus.  Cardiovascular: Normal rate, regular rhythm and normal heart sounds.   Pulmonary/Chest: Effort normal and breath sounds normal. No  respiratory distress.  Musculoskeletal: Normal range of motion. He exhibits no edema or tenderness.  Leg back exam normal  Neurological: He is alert and oriented to person, place, and time.  Skin: Skin is intact. No rash noted.  Psychiatric: He has a normal mood and affect. His speech is normal and behavior is normal. Judgment and thought content normal. Cognition and memory are normal.    Results for orders placed or performed in visit on 10/09/16  Microscopic Examination  Result Value Ref Range   WBC, UA 0-5 0 - 5 /hpf   RBC, UA 0-2 0 - 2 /hpf   Epithelial Cells (non renal) None seen 0 - 10 /hpf   Bacteria, UA Few (A) None seen/Few  Bayer DCA Hb A1c Waived  Result Value Ref Range   Bayer DCA Hb A1c Waived 7.4 (H) <7.0 %  Comprehensive metabolic panel  Result Value Ref Range   Glucose 161 (H) 65 - 99 mg/dL   BUN 19 8 - 27 mg/dL   Creatinine, Ser 0.98 0.76 - 1.27 mg/dL   GFR calc non Af Amer 75 >59 mL/min/1.73   GFR calc Af Amer 87 >59 mL/min/1.73   BUN/Creatinine Ratio 19 10 - 24   Sodium 141 134 - 144 mmol/L   Potassium 5.0 3.5 - 5.2 mmol/L   Chloride  99 96 - 106 mmol/L   CO2 25 18 - 29 mmol/L   Calcium 9.4 8.6 - 10.2 mg/dL   Total Protein 6.9 6.0 - 8.5 g/dL   Albumin 4.5 3.5 - 4.8 g/dL   Globulin, Total 2.4 1.5 - 4.5 g/dL   Albumin/Globulin Ratio 1.9 1.2 - 2.2   Bilirubin Total 0.4 0.0 - 1.2 mg/dL   Alkaline Phosphatase 46 39 - 117 IU/L   AST 20 0 - 40 IU/L   ALT 18 0 - 44 IU/L  Lipid Panel w/o Chol/HDL Ratio  Result Value Ref Range   Cholesterol, Total 124 100 - 199 mg/dL   Triglycerides 94 0 - 149 mg/dL   HDL 56 >39 mg/dL   VLDL Cholesterol Cal 19 5 - 40 mg/dL   LDL Calculated 49 0 - 99 mg/dL  PSA  Result Value Ref Range   Prostate Specific Ag, Serum 4.7 (H) 0.0 - 4.0 ng/mL  TSH  Result Value Ref Range   TSH 4.580 (H) 0.450 - 4.500 uIU/mL  UA/M w/rflx Culture, Routine  Result Value Ref Range   Specific Gravity, UA 1.020 1.005 - 1.030   pH, UA 5.0 5.0 - 7.5     Color, UA Yellow Yellow   Appearance Ur Clear Clear   Leukocytes, UA Negative Negative   Protein, UA Negative Negative/Trace   Glucose, UA 2+ (A) Negative   Ketones, UA Negative Negative   RBC, UA Trace (A) Negative   Bilirubin, UA Negative Negative   Urobilinogen, Ur 0.2 0.2 - 1.0 mg/dL   Nitrite, UA Negative Negative   Microscopic Examination See below:   CBC  Result Value Ref Range   WBC 7.9 3.4 - 10.8 x10E3/uL   RBC 4.14 4.14 - 5.80 x10E6/uL   Hemoglobin 12.7 12.6 - 17.7 g/dL   Hematocrit 40.1 37.5 - 51.0 %   MCV 97 79 - 97 fL   MCH 30.7 26.6 - 33.0 pg   MCHC 31.7 31.5 - 35.7 g/dL   RDW 15.0 12.3 - 15.4 %   Platelets 134 (L) 150 - 379 x10E3/uL      Assessment & Plan:   Problem List Items Addressed This Visit    None       Follow up plan: No Follow-up on file.

## 2017-01-06 NOTE — Assessment & Plan Note (Signed)
The current medical regimen is effective;  continue present plan and medications.  

## 2017-01-06 NOTE — Assessment & Plan Note (Signed)
Discuss back pain care and treatment with walking exercise stretching use of Advil and Aleve Tylenol worsening or changing symptoms will need reevaluation.

## 2017-01-07 DIAGNOSIS — E119 Type 2 diabetes mellitus without complications: Secondary | ICD-10-CM | POA: Diagnosis not present

## 2017-01-08 DIAGNOSIS — E119 Type 2 diabetes mellitus without complications: Secondary | ICD-10-CM | POA: Diagnosis not present

## 2017-02-18 ENCOUNTER — Ambulatory Visit (INDEPENDENT_AMBULATORY_CARE_PROVIDER_SITE_OTHER): Payer: Medicare Other | Admitting: Family Medicine

## 2017-02-18 ENCOUNTER — Encounter: Payer: Self-pay | Admitting: Family Medicine

## 2017-02-18 VITALS — BP 128/71 | HR 84 | Temp 98.4°F | Wt 176.0 lb

## 2017-02-18 DIAGNOSIS — J069 Acute upper respiratory infection, unspecified: Secondary | ICD-10-CM

## 2017-02-18 MED ORDER — HYDROCOD POLST-CPM POLST ER 10-8 MG/5ML PO SUER: 5.0000 mL | Freq: Two times a day (BID) | ORAL | 0 refills | Status: DC | PRN

## 2017-02-18 MED ORDER — ALBUTEROL SULFATE HFA 108 (90 BASE) MCG/ACT IN AERS
2.0000 | INHALATION_SPRAY | Freq: Four times a day (QID) | RESPIRATORY_TRACT | 0 refills | Status: DC | PRN
Start: 2017-02-18 — End: 2017-10-28

## 2017-02-18 MED ORDER — BENZONATATE 200 MG PO CAPS: 200.0000 mg | ORAL_CAPSULE | Freq: Three times a day (TID) | ORAL | 0 refills | Status: DC | PRN

## 2017-02-18 MED ORDER — DOXYCYCLINE HYCLATE 100 MG PO TABS: 100.0000 mg | ORAL_TABLET | Freq: Two times a day (BID) | ORAL | 0 refills | Status: DC

## 2017-02-18 NOTE — Patient Instructions (Signed)
Follow up if worsening or no improvement

## 2017-02-18 NOTE — Progress Notes (Signed)
   BP 128/71   Pulse 84   Temp 98.4 F (36.9 C)   Wt 176 lb (79.8 kg)   SpO2 96%   BMI 23.87 kg/m    Subjective:    Patient ID: Rushi Chasen, male    DOB: [birth year], 76 y.o.   MRN: [id number]  HPI: Nain Rudd is a 76 y.o. male  Chief Complaint  Patient presents with  . URI    x 2 weeks, productive cough, head/chest congestion, sore throat. No fever, no body aches., no ear ache.   Patient presents with 2 weeks of productive cough, wheezing, sore throat, congestion. Productive cough worse at night, sleeping sitting up. Trying robitussin and tylenol, as well as some leftover tussionex from December that has since run out. Not much relief with these. Denies fever, chills, SOB, CP. Wife also sick.   Relevant past medical, surgical, family and social history reviewed and updated as indicated. Interim medical history since our last visit reviewed. Allergies and medications reviewed and updated.  Review of Systems  Constitutional: Negative.   HENT: Positive for congestion and sore throat.   Eyes: Negative.   Respiratory: Positive for cough and wheezing.   Cardiovascular: Negative.   Gastrointestinal: Negative.   Genitourinary: Negative.   Musculoskeletal: Negative.   Neurological: Negative.   Psychiatric/Behavioral: Negative.     Per HPI unless specifically indicated above     Objective:    BP 128/71   Pulse 84   Temp 98.4 F (36.9 C)   Wt 176 lb (79.8 kg)   SpO2 96%   BMI 23.87 kg/m   Wt Readings from Last 3 Encounters:  02/18/17 176 lb (79.8 kg)  01/06/17 178 lb 3.2 oz (80.8 kg)  11/19/16 179 lb (81.2 kg)    Physical Exam  Constitutional: He is oriented to person, place, and time. He appears well-developed and well-nourished. No distress.  HENT:  Head: Atraumatic.  Right Ear: External ear normal.  Left Ear: External ear normal.  Thick drainage in nares Oropharynx erythematous  Eyes: Conjunctivae are normal. Pupils are equal, round, and reactive to light.  Neck:  Normal range of motion. Neck supple.  Cardiovascular: Normal rate.   Pulmonary/Chest: Effort normal. He has wheezes (mild wheezes, R>L).  Musculoskeletal: Normal range of motion.  Neurological: He is alert and oriented to person, place, and time.  Skin: Skin is warm and dry.  Psychiatric: He has a normal mood and affect. His behavior is normal.  Nursing note and vitals reviewed.     Assessment & Plan:   Problem List Items Addressed This Visit    None    Visit Diagnoses    Upper respiratory tract infection, unspecified type    -  Primary   Will treat with doxycycline, tessalon, tussionex, and albuterol inhaler prn. Discussed propping up at night, supportive care. F/u if no improvement in 3-4 days       Follow up plan: Return if symptoms worsen or fail to improve.

## 2017-03-13 DIAGNOSIS — E782 Mixed hyperlipidemia: Secondary | ICD-10-CM | POA: Diagnosis not present

## 2017-03-13 DIAGNOSIS — I10 Essential (primary) hypertension: Secondary | ICD-10-CM | POA: Diagnosis not present

## 2017-03-13 DIAGNOSIS — I2510 Atherosclerotic heart disease of native coronary artery without angina pectoris: Secondary | ICD-10-CM | POA: Diagnosis not present

## 2017-03-17 ENCOUNTER — Ambulatory Visit
Admission: RE | Admit: 2017-03-17 | Discharge: 2017-03-17 | Disposition: A | Discharge status: Home / Self Care | Payer: Medicare Other | Source: Ambulatory Visit | Attending: Family Medicine | Admitting: Family Medicine

## 2017-03-17 ENCOUNTER — Telehealth: Payer: Self-pay | Admitting: Family Medicine

## 2017-03-17 ENCOUNTER — Ambulatory Visit (INDEPENDENT_AMBULATORY_CARE_PROVIDER_SITE_OTHER): Payer: Medicare Other | Admitting: Family Medicine

## 2017-03-17 ENCOUNTER — Encounter: Payer: Self-pay | Admitting: Family Medicine

## 2017-03-17 VITALS — BP 110/67 | HR 66 | Temp 98.7°F | Wt 178.6 lb

## 2017-03-17 DIAGNOSIS — R05 Cough: Secondary | ICD-10-CM | POA: Insufficient documentation

## 2017-03-17 DIAGNOSIS — R059 Cough, unspecified: Secondary | ICD-10-CM

## 2017-03-17 MED ORDER — BENZONATATE 200 MG PO CAPS: 200.0000 mg | ORAL_CAPSULE | Freq: Three times a day (TID) | ORAL | 0 refills | Status: DC | PRN

## 2017-03-17 MED ORDER — PREDNISONE 20 MG PO TABS: 40.0000 mg | ORAL_TABLET | Freq: Every day | ORAL | 0 refills | Status: DC

## 2017-03-17 NOTE — Progress Notes (Signed)
BP 110/67 (BP Location: Right Arm, Patient Position: Sitting, Cuff Size: Normal)   Pulse 66   Temp 98.7 F (37.1 C)   Wt 178 lb 9.6 oz (81 kg)   SpO2 98%   BMI 24.22 kg/m    Subjective:    Patient ID: Paul Parker, male    DOB: 10/12/1941, 76 y.o.   MRN: [id number]  HPI: Micael Barb is a 76 y.o. male presenting today with a persistent cough and sore throat. He was seen for this issue on 3/27 and reports getting better for a short time after completing a course of doxycyline before the sx relapsed over the past week. His wife describes the cough as wet, occurring in "fits" and worse at night. He has been using the tessalon pearls, tussionex, and inhalers with some relief. No sick contacts at home, not a smoker, no hx of asthma. Denies fever, chills, pleuritic pain, and nasal congestion. Was seen by cardiologist last week, reports that chest x-ray was not indicated at that time.   Chief Complaint  Patient presents with  . Cough    Been seen previously for same symptoms. Cough has came back. Sleeping in recliner. x's 2 nights.   Relevant past medical, surgical, family and social history reviewed and updated as indicated. Interim medical history since our last visit reviewed. Allergies and medications reviewed and updated.  Review of Systems  Constitutional: Negative for chills, diaphoresis, fatigue and fever.  HENT: Positive for postnasal drip and sore throat. Negative for congestion, ear pain, rhinorrhea, sinus pain and sinus pressure.   Eyes: Negative.   Respiratory: Positive for cough and wheezing.   Cardiovascular: Negative for chest pain, palpitations and leg swelling.  Gastrointestinal: Negative.   Endocrine: Negative.   Genitourinary: Negative.   Musculoskeletal: Negative.   Allergic/Immunologic: Negative.   Neurological: Negative.   Hematological: Negative.   Psychiatric/Behavioral: Negative.     Per HPI unless specifically indicated above     Objective:    BP 110/67 (BP  Location: Right Arm, Patient Position: Sitting, Cuff Size: Normal)   Pulse 66   Temp 98.7 F (37.1 C)   Wt 178 lb 9.6 oz (81 kg)   SpO2 98%   BMI 24.22 kg/m   Wt Readings from Last 3 Encounters:  03/17/17 178 lb 9.6 oz (81 kg)  02/18/17 176 lb (79.8 kg)  01/06/17 178 lb 3.2 oz (80.8 kg)    Physical Exam  Constitutional: He is oriented to person, place, and time. He appears well-developed and well-nourished. No distress.  HENT:  Head: Normocephalic and atraumatic.  Right Ear: External ear normal.  Left Ear: External ear normal.  Nose: Nose normal.  Mouth/Throat: Oropharynx is clear and moist. No oropharyngeal exudate.  Eyes: Right eye exhibits no discharge. Left eye exhibits no discharge. No scleral icterus.  Neck: Neck supple.  Cardiovascular: Normal rate.   Pulmonary/Chest: Effort normal. He has no wheezes. He has no rhonchi. He has no rales.      No chest wall or back TTP   Musculoskeletal: Normal range of motion.  Lymphadenopathy:    He has no cervical adenopathy.  Neurological: He is alert and oriented to person, place, and time.  Skin: Skin is warm and dry. He is not diaphoretic.  Psychiatric: He has a normal mood and affect. His behavior is normal.      Assessment & Plan:   Problem List Items Addressed This Visit    None    Visit Diagnoses    Cough    -  Primary   Given questionable rales and recurring nature, will await CXR. Continue tessalon, tussionex, and albuterol in meantime. F/u if worsening or no improvement   Relevant Orders   DG Chest 2 View (Completed)       Follow up plan: Return if symptoms worsen or fail to improve.

## 2017-03-17 NOTE — Patient Instructions (Addendum)
[phone] Wife's number until 3 pm

## 2017-03-17 NOTE — Telephone Encounter (Signed)
Patient notified

## 2017-03-17 NOTE — Telephone Encounter (Signed)
Please call pt and let them know the x-ray came back with no acute changes - no pneumonia. Wife requested to be called at work with results - [phone] until 3 pm, after 3 can call regular number. I will send in some prednisone for him that he should take along with the tessalon, tussionex, and albuterol inhaler.

## 2017-03-27 DIAGNOSIS — I25701 Atherosclerosis of coronary artery bypass graft(s), unspecified, with angina pectoris with documented spasm: Secondary | ICD-10-CM | POA: Diagnosis not present

## 2017-03-27 DIAGNOSIS — T829XXA Unspecified complication of cardiac and vascular prosthetic device, implant and graft, initial encounter: Secondary | ICD-10-CM | POA: Diagnosis not present

## 2017-03-27 DIAGNOSIS — Z950 Presence of cardiac pacemaker: Secondary | ICD-10-CM | POA: Diagnosis not present

## 2017-03-27 DIAGNOSIS — S40011A Contusion of right shoulder, initial encounter: Secondary | ICD-10-CM | POA: Diagnosis not present

## 2017-04-08 ENCOUNTER — Ambulatory Visit: Payer: Medicare Other | Admitting: Family Medicine

## 2017-04-15 ENCOUNTER — Ambulatory Visit (INDEPENDENT_AMBULATORY_CARE_PROVIDER_SITE_OTHER): Payer: Medicare Other | Admitting: Family Medicine

## 2017-04-15 ENCOUNTER — Encounter: Payer: Self-pay | Admitting: Family Medicine

## 2017-04-15 VITALS — BP 129/75 | HR 83 | Wt 172.4 lb

## 2017-04-15 DIAGNOSIS — D692 Other nonthrombocytopenic purpura: Secondary | ICD-10-CM

## 2017-04-15 DIAGNOSIS — E119 Type 2 diabetes mellitus without complications: Secondary | ICD-10-CM | POA: Diagnosis not present

## 2017-04-15 DIAGNOSIS — E785 Hyperlipidemia, unspecified: Secondary | ICD-10-CM | POA: Diagnosis not present

## 2017-04-15 DIAGNOSIS — I25111 Atherosclerotic heart disease of native coronary artery with angina pectoris with documented spasm: Secondary | ICD-10-CM

## 2017-04-15 DIAGNOSIS — I10 Essential (primary) hypertension: Secondary | ICD-10-CM | POA: Diagnosis not present

## 2017-04-15 LAB — LP+ALT+AST PICCOLO, WAIVED
ALT (SGPT) Piccolo, Waived: 16 U/L (ref 10–47)
AST (SGOT) Piccolo, Waived: 20 U/L (ref 11–38)
Chol/HDL Ratio Piccolo,Waive: 2.3 mg/dL
Cholesterol Piccolo, Waived: 121 mg/dL (ref <200)
HDL Chol Piccolo, Waived: 53 mg/dL — ABNORMAL LOW (ref >59)
LDL Chol Calc Piccolo Waived: 47 mg/dL (ref <100)
Triglycerides Piccolo,Waived: 109 mg/dL (ref <150)
VLDL Chol Calc Piccolo,Waive: 22 mg/dL (ref <30)

## 2017-04-15 LAB — BAYER DCA HB A1C WAIVED: HB A1C (BAYER DCA - WAIVED): 7.5 % — ABNORMAL HIGH (ref <7.0)

## 2017-04-15 NOTE — Assessment & Plan Note (Signed)
The current medical regimen is effective;  continue present plan and medications.  

## 2017-04-15 NOTE — Assessment & Plan Note (Signed)
Worse after fall

## 2017-04-15 NOTE — Progress Notes (Signed)
BP 129/75   Pulse 83   Wt 172 lb 6.4 oz (78.2 kg)   SpO2 98%   BMI 23.38 kg/m    Subjective:    Patient ID: Paul Parker, male    DOB: March 09, 1941, 76 y.o.   MRN: [id number]  HPI: Paul Parker is a 76 y.o. male  Chief Complaint  Patient presents with  . Follow-up  . Hyperlipidemia  . Hypertension  DM Patient all in all doing well had an eventful weekend 2 weekends ago with a fall down some steps a couple of steps and banged up his left hand shoulder and hip is recovering well and almost back to normal. No low blood sugar spells no issues with blood pressure or other medications. Taking faithfully. Cholesterol also doing well.   Relevant past medical, surgical, family and social history reviewed and updated as indicated. Interim medical history since our last visit reviewed. Allergies and medications reviewed and updated.  Review of Systems  Constitutional: Negative.   Respiratory: Negative.   Cardiovascular: Negative.     Per HPI unless specifically indicated above     Objective:    BP 129/75   Pulse 83   Wt 172 lb 6.4 oz (78.2 kg)   SpO2 98%   BMI 23.38 kg/m   Wt Readings from Last 3 Encounters:  04/15/17 172 lb 6.4 oz (78.2 kg)  03/17/17 178 lb 9.6 oz (81 kg)  02/18/17 176 lb (79.8 kg)    Physical Exam  Constitutional: He is oriented to person, place, and time. He appears well-developed and well-nourished.  HENT:  Head: Normocephalic and atraumatic.  Eyes: Conjunctivae and EOM are normal.  Neck: Normal range of motion.  Cardiovascular: Normal rate, regular rhythm and normal heart sounds.   Pulmonary/Chest: Effort normal and breath sounds normal.  Musculoskeletal: Normal range of motion.  Neurological: He is alert and oriented to person, place, and time.  Skin: No erythema.  Psychiatric: He has a normal mood and affect. His behavior is normal. Judgment and thought content normal.    Results for orders placed or performed in visit on 10/09/16  Microscopic  Examination  Result Value Ref Range   WBC, UA 0-5 0 - 5 /hpf   RBC, UA 0-2 0 - 2 /hpf   Epithelial Cells (non renal) None seen 0 - 10 /hpf   Bacteria, UA Few (A) None seen/Few  Bayer DCA Hb A1c Waived  Result Value Ref Range   Bayer DCA Hb A1c Waived 7.4 (H) <7.0 %  Comprehensive metabolic panel  Result Value Ref Range   Glucose 161 (H) 65 - 99 mg/dL   BUN 19 8 - 27 mg/dL   Creatinine, Ser 0.98 0.76 - 1.27 mg/dL   GFR calc non Af Amer 75 >59 mL/min/1.73   GFR calc Af Amer 87 >59 mL/min/1.73   BUN/Creatinine Ratio 19 10 - 24   Sodium 141 134 - 144 mmol/L   Potassium 5.0 3.5 - 5.2 mmol/L   Chloride 99 96 - 106 mmol/L   CO2 25 18 - 29 mmol/L   Calcium 9.4 8.6 - 10.2 mg/dL   Total Protein 6.9 6.0 - 8.5 g/dL   Albumin 4.5 3.5 - 4.8 g/dL   Globulin, Total 2.4 1.5 - 4.5 g/dL   Albumin/Globulin Ratio 1.9 1.2 - 2.2   Bilirubin Total 0.4 0.0 - 1.2 mg/dL   Alkaline Phosphatase 46 39 - 117 IU/L   AST 20 0 - 40 IU/L   ALT 18 0 - 44  IU/L  Lipid Panel w/o Chol/HDL Ratio  Result Value Ref Range   Cholesterol, Total 124 100 - 199 mg/dL   Triglycerides 94 0 - 149 mg/dL   HDL 56 >39 mg/dL   VLDL Cholesterol Cal 19 5 - 40 mg/dL   LDL Calculated 49 0 - 99 mg/dL  PSA  Result Value Ref Range   Prostate Specific Ag, Serum 4.7 (H) 0.0 - 4.0 ng/mL  TSH  Result Value Ref Range   TSH 4.580 (H) 0.450 - 4.500 uIU/mL  UA/M w/rflx Culture, Routine  Result Value Ref Range   Specific Gravity, UA 1.020 1.005 - 1.030   pH, UA 5.0 5.0 - 7.5   Color, UA Yellow Yellow   Appearance Ur Clear Clear   Leukocytes, UA Negative Negative   Protein, UA Negative Negative/Trace   Glucose, UA 2+ (A) Negative   Ketones, UA Negative Negative   RBC, UA Trace (A) Negative   Bilirubin, UA Negative Negative   Urobilinogen, Ur 0.2 0.2 - 1.0 mg/dL   Nitrite, UA Negative Negative   Microscopic Examination See below:   CBC  Result Value Ref Range   WBC 7.9 3.4 - 10.8 x10E3/uL   RBC 4.14 4.14 - 5.80 x10E6/uL    Hemoglobin 12.7 12.6 - 17.7 g/dL   Hematocrit 40.1 37.5 - 51.0 %   MCV 97 79 - 97 fL   MCH 30.7 26.6 - 33.0 pg   MCHC 31.7 31.5 - 35.7 g/dL   RDW 15.0 12.3 - 15.4 %   Platelets 134 (L) 150 - 379 x10E3/uL      Assessment & Plan:   Problem List Items Addressed This Visit      Cardiovascular and Mediastinum   Hypertension - Primary    The current medical regimen is effective;  continue present plan and medications.       Relevant Orders   Basic metabolic panel   LP+ALT+AST Piccolo, Waived   Senile purpura ([place])    Worse after fall      CAD (coronary artery disease)    The current medical regimen is effective;  continue present plan and medications.         Endocrine   Diabetes mellitus without complication ([place])    The current medical regimen is effective;  continue present plan and medications.       Relevant Orders   Basic metabolic panel   LP+ALT+AST Piccolo, Waived   Bayer DCA Hb A1c Waived     Other   Hyperlipidemia    The current medical regimen is effective;  continue present plan and medications.       Relevant Orders   Basic metabolic panel   LP+ALT+AST Piccolo, Waived       Follow up plan: Return in about 3 months (around 07/16/2017) for Hemoglobin A1c.

## 2017-04-16 ENCOUNTER — Encounter: Payer: Self-pay | Admitting: Family Medicine

## 2017-04-16 LAB — BASIC METABOLIC PANEL
BUN/Creatinine Ratio: 27 — ABNORMAL HIGH (ref 10–24)
BUN: 22 mg/dL (ref 8–27)
CO2: 25 mmol/L (ref 18–29)
Calcium: 9.5 mg/dL (ref 8.6–10.2)
Chloride: 102 mmol/L (ref 96–106)
Creatinine, Ser: 0.81 mg/dL (ref 0.76–1.27)
GFR calc Af Amer: 100 mL/min/{1.73_m2} (ref >59)
GFR calc non Af Amer: 86 mL/min/{1.73_m2} (ref >59)
Glucose: 153 mg/dL — ABNORMAL HIGH (ref 65–99)
Potassium: 4.8 mmol/L (ref 3.5–5.2)
Sodium: 142 mmol/L (ref 134–144)

## 2017-06-11 DIAGNOSIS — E119 Type 2 diabetes mellitus without complications: Secondary | ICD-10-CM | POA: Diagnosis not present

## 2017-06-23 ENCOUNTER — Encounter: Payer: Self-pay | Admitting: Family Medicine

## 2017-06-23 ENCOUNTER — Ambulatory Visit (INDEPENDENT_AMBULATORY_CARE_PROVIDER_SITE_OTHER): Payer: Medicare Other | Admitting: Family Medicine

## 2017-06-23 DIAGNOSIS — M7032 Other bursitis of elbow, left elbow: Secondary | ICD-10-CM | POA: Insufficient documentation

## 2017-06-23 DIAGNOSIS — E119 Type 2 diabetes mellitus without complications: Secondary | ICD-10-CM

## 2017-06-23 DIAGNOSIS — I10 Essential (primary) hypertension: Secondary | ICD-10-CM | POA: Diagnosis not present

## 2017-06-23 DIAGNOSIS — M7022 Olecranon bursitis, left elbow: Secondary | ICD-10-CM

## 2017-06-23 NOTE — Assessment & Plan Note (Signed)
The current medical regimen is effective;  continue present plan and medications.  

## 2017-06-23 NOTE — Assessment & Plan Note (Signed)
Discussed bursitis care and treatment rest elbow pads if necessary use in a walking stick to aid with unsteadiness on feet and observe for signs and symptoms of infection will use hot compresses for the next several days

## 2017-06-23 NOTE — Progress Notes (Signed)
BP 127/71   Pulse 77   Wt 168 lb (76.2 kg)   SpO2 98%   BMI 22.78 kg/m    Subjective:    Patient ID: Paul Parker, male    DOB: 1940-11-26, 76 y.o.   MRN: [id number]  HPI: Paul Parker is a 76 y.o. male  Chief Complaint  Patient presents with  . Cyst    On Left elbow. Noticed it last week. Not painful.    Patient with changes of left elbow bursitis is been present for over a week no real pain associated had some trauma back in May but nothing specific but does fall up against the wall and stagger around some. Patient's noticed no low blood sugar spells low blood pressure issues or what ever associated. There is maybe some slight redness to the area of the bursa but otherwise clear. Relevant past medical, surgical, family and social history reviewed and updated as indicated. Interim medical history since our last visit reviewed. Allergies and medications reviewed and updated.  Review of Systems  Constitutional: Negative.   Respiratory: Negative.   Cardiovascular: Negative.     Per HPI unless specifically indicated above     Objective:    BP 127/71   Pulse 77   Wt 168 lb (76.2 kg)   SpO2 98%   BMI 22.78 kg/m   Wt Readings from Last 3 Encounters:  06/23/17 168 lb (76.2 kg)  04/15/17 172 lb 6.4 oz (78.2 kg)  03/17/17 178 lb 9.6 oz (81 kg)    Physical Exam  Constitutional: He is oriented to person, place, and time. He appears well-developed and well-nourished.  HENT:  Head: Normocephalic and atraumatic.  Eyes: Conjunctivae and EOM are normal.  Neck: Normal range of motion.  Cardiovascular: Normal rate, regular rhythm and normal heart sounds.   Pulmonary/Chest: Effort normal and breath sounds normal.  Musculoskeletal: Normal range of motion.  Left elbow bursa enlarged maybe slightly red but nontender  Neurological: He is alert and oriented to person, place, and time.  Skin: No erythema.  Psychiatric: He has a normal mood and affect. His behavior is normal. Judgment and  thought content normal.    Results for orders placed or performed in visit on 27/03/50  Basic metabolic panel  Result Value Ref Range   Glucose 153 (H) 65 - 99 mg/dL   BUN 22 8 - 27 mg/dL   Creatinine, Ser 0.81 0.76 - 1.27 mg/dL   GFR calc non Af Amer 86 >59 mL/min/1.73   GFR calc Af Amer 100 >59 mL/min/1.73   BUN/Creatinine Ratio 27 (H) 10 - 24   Sodium 142 134 - 144 mmol/L   Potassium 4.8 3.5 - 5.2 mmol/L   Chloride 102 96 - 106 mmol/L   CO2 25 18 - 29 mmol/L   Calcium 9.5 8.6 - 10.2 mg/dL  LP+ALT+AST Piccolo, Waived  Result Value Ref Range   ALT (SGPT) Piccolo, Waived 16 10 - 47 U/L   AST (SGOT) Piccolo, Waived 20 11 - 38 U/L   Cholesterol Piccolo, Waived 121 <200 mg/dL   HDL Chol Piccolo, Waived 53 (L) >59 mg/dL   Triglycerides Piccolo,Waived 109 <150 mg/dL   Chol/HDL Ratio Piccolo,Waive 2.3 mg/dL   LDL Chol Calc Piccolo Waived 47 <100 mg/dL   VLDL Chol Calc Piccolo,Waive 22 <30 mg/dL  Bayer DCA Hb A1c Waived  Result Value Ref Range   Bayer DCA Hb A1c Waived 7.5 (H) <7.0 %      Assessment & Plan:  Problem List Items Addressed This Visit      Cardiovascular and Mediastinum   Hypertension    The current medical regimen is effective;  continue present plan and medications.         Endocrine   Diabetes mellitus without complication ([place])    The current medical regimen is effective;  continue present plan and medications.         Musculoskeletal and Integument   Bursitis of left elbow    Discussed bursitis care and treatment rest elbow pads if necessary use in a walking stick to aid with unsteadiness on feet and observe for signs and symptoms of infection will use hot compresses for the next several days          Follow up plan: Return for As scheduled.

## 2017-06-26 DIAGNOSIS — K219 Gastro-esophageal reflux disease without esophagitis: Secondary | ICD-10-CM | POA: Diagnosis not present

## 2017-06-26 DIAGNOSIS — M7750 Other enthesopathy of unspecified foot: Secondary | ICD-10-CM | POA: Diagnosis not present

## 2017-06-26 DIAGNOSIS — Z950 Presence of cardiac pacemaker: Secondary | ICD-10-CM | POA: Diagnosis not present

## 2017-06-26 DIAGNOSIS — S40011A Contusion of right shoulder, initial encounter: Secondary | ICD-10-CM | POA: Diagnosis not present

## 2017-06-30 ENCOUNTER — Encounter: Payer: Self-pay | Admitting: Family Medicine

## 2017-06-30 ENCOUNTER — Ambulatory Visit (INDEPENDENT_AMBULATORY_CARE_PROVIDER_SITE_OTHER): Payer: Medicare Other | Admitting: Family Medicine

## 2017-06-30 VITALS — BP 114/67 | HR 65 | Temp 97.6°F | Wt 170.0 lb

## 2017-06-30 DIAGNOSIS — M7022 Olecranon bursitis, left elbow: Secondary | ICD-10-CM

## 2017-06-30 NOTE — Assessment & Plan Note (Signed)
No evidence of infection, not causing any pain. Continue compresses, recommended ACE wrap compression, epsom salt soaks. Return precautions reviewed including redness, warmth, severe pain, fevers. Avoid injury to the area.

## 2017-06-30 NOTE — Progress Notes (Signed)
   BP 114/67   Pulse 65   Temp 97.6 F (36.4 C) (Oral)   Wt 170 lb (77.1 kg)   SpO2 95%   BMI 23.06 kg/m    Subjective:    Patient ID: Paul Parker, male    DOB: May 12, 1941, 76 y.o.   MRN: [id number]  HPI: Paul Parker is a 76 y.o. male  Chief Complaint  Patient presents with  . Bursitis    Elbow. No pain. No improvement.    Patient presents for recheck of left elbow bursitis with significant edema. Has been doing warm compresses with no relief. No redness, pain, decreased ROM, or fevers. Wanted to make sure there was nothing he could be doing for quicker relief.   Relevant past medical, surgical, family and social history reviewed and updated as indicated. Interim medical history since our last visit reviewed. Allergies and medications reviewed and updated.  Review of Systems  Constitutional: Negative.   HENT: Negative.   Respiratory: Negative.   Cardiovascular: Negative.   Gastrointestinal: Negative.   Musculoskeletal: Positive for joint swelling.  Skin: Negative.   Neurological: Negative.   Psychiatric/Behavioral: Negative.    Per HPI unless specifically indicated above     Objective:    BP 114/67   Pulse 65   Temp 97.6 F (36.4 C) (Oral)   Wt 170 lb (77.1 kg)   SpO2 95%   BMI 23.06 kg/m   Wt Readings from Last 3 Encounters:  06/30/17 170 lb (77.1 kg)  06/23/17 168 lb (76.2 kg)  04/15/17 172 lb 6.4 oz (78.2 kg)    Physical Exam  Constitutional: He is oriented to person, place, and time. He appears well-developed and well-nourished. No distress.  HENT:  Head: Atraumatic.  Eyes: Pupils are equal, round, and reactive to light. Conjunctivae are normal.  Neck: Normal range of motion. Neck supple.  Cardiovascular: Normal rate and normal heart sounds.   Pulmonary/Chest: Effort normal and breath sounds normal. No respiratory distress.  Musculoskeletal: Normal range of motion. He exhibits edema (left olecranon bursa inflamed). He exhibits no tenderness.  Neurological:  He is alert and oriented to person, place, and time.  Skin: Skin is warm and dry. No erythema.  Psychiatric: He has a normal mood and affect. His behavior is normal.  Nursing note and vitals reviewed.     Assessment & Plan:   Problem List Items Addressed This Visit      Musculoskeletal and Integument   Bursitis of left elbow - Primary    No evidence of infection, not causing any pain. Continue compresses, recommended ACE wrap compression, epsom salt soaks. Return precautions reviewed including redness, warmth, severe pain, fevers. Avoid injury to the area.           Follow up plan: Return for as scheduled.

## 2017-06-30 NOTE — Patient Instructions (Signed)
Follow up as needed

## 2017-07-02 DIAGNOSIS — R079 Chest pain, unspecified: Secondary | ICD-10-CM | POA: Diagnosis not present

## 2017-07-04 DIAGNOSIS — Z8679 Personal history of other diseases of the circulatory system: Secondary | ICD-10-CM | POA: Diagnosis not present

## 2017-07-04 DIAGNOSIS — I10 Essential (primary) hypertension: Secondary | ICD-10-CM | POA: Diagnosis not present

## 2017-07-04 DIAGNOSIS — E782 Mixed hyperlipidemia: Secondary | ICD-10-CM | POA: Diagnosis not present

## 2017-07-04 DIAGNOSIS — I2510 Atherosclerotic heart disease of native coronary artery without angina pectoris: Secondary | ICD-10-CM | POA: Diagnosis not present

## 2017-07-16 ENCOUNTER — Ambulatory Visit (INDEPENDENT_AMBULATORY_CARE_PROVIDER_SITE_OTHER): Payer: Medicare Other | Admitting: Family Medicine

## 2017-07-16 ENCOUNTER — Encounter: Payer: Self-pay | Admitting: Family Medicine

## 2017-07-16 VITALS — BP 115/71 | HR 81 | Wt 168.0 lb

## 2017-07-16 DIAGNOSIS — E785 Hyperlipidemia, unspecified: Secondary | ICD-10-CM | POA: Diagnosis not present

## 2017-07-16 DIAGNOSIS — I10 Essential (primary) hypertension: Secondary | ICD-10-CM | POA: Diagnosis not present

## 2017-07-16 DIAGNOSIS — E119 Type 2 diabetes mellitus without complications: Secondary | ICD-10-CM

## 2017-07-16 LAB — BAYER DCA HB A1C WAIVED: HB A1C (BAYER DCA - WAIVED): 6.9 % (ref <7.0)

## 2017-07-16 NOTE — Assessment & Plan Note (Signed)
The current medical regimen is effective;  continue present plan and medications.  

## 2017-07-16 NOTE — Progress Notes (Signed)
BP 115/71   Pulse 81   Wt 168 lb (76.2 kg)   SpO2 96%   BMI 22.78 kg/m    Subjective:    Patient ID: Paul Parker, male    DOB: 04/14/1941, 76 y.o.   MRN: [id number]  HPI: Paul Parker is a 76 y.o. male  Chief Complaint  Patient presents with  . Follow-up  . Hypertension  . Diabetes   Follow-up diabetes doing well no complaints from medications accompanied by wife who assists with history. No low blood sugar spells takes medications faithfully for blood pressure cholesterol also without problems. No thyroid issues or side effects from any medications breathing is doing well. Relevant past medical, surgical, family and social history reviewed and updated as indicated. Interim medical history since our last visit reviewed. Allergies and medications reviewed and updated.  Review of Systems  Constitutional: Negative.   Respiratory: Negative.   Cardiovascular: Negative.     Per HPI unless specifically indicated above     Objective:    BP 115/71   Pulse 81   Wt 168 lb (76.2 kg)   SpO2 96%   BMI 22.78 kg/m   Wt Readings from Last 3 Encounters:  07/16/17 168 lb (76.2 kg)  06/30/17 170 lb (77.1 kg)  06/23/17 168 lb (76.2 kg)    Physical Exam  Constitutional: He is oriented to person, place, and time. He appears well-developed and well-nourished.  HENT:  Head: Normocephalic and atraumatic.  Eyes: Conjunctivae and EOM are normal.  Neck: Normal range of motion.  Cardiovascular: Normal rate, regular rhythm and normal heart sounds.   Pulmonary/Chest: Effort normal and breath sounds normal.  Musculoskeletal: Normal range of motion.  Neurological: He is alert and oriented to person, place, and time.  Skin: No erythema.  Psychiatric: He has a normal mood and affect. His behavior is normal. Judgment and thought content normal.    Results for orders placed or performed in visit on 09/32/67  Basic metabolic panel  Result Value Ref Range   Glucose 153 (H) 65 - 99 mg/dL   BUN 22  8 - 27 mg/dL   Creatinine, Ser 0.81 0.76 - 1.27 mg/dL   GFR calc non Af Amer 86 >59 mL/min/1.73   GFR calc Af Amer 100 >59 mL/min/1.73   BUN/Creatinine Ratio 27 (H) 10 - 24   Sodium 142 134 - 144 mmol/L   Potassium 4.8 3.5 - 5.2 mmol/L   Chloride 102 96 - 106 mmol/L   CO2 25 18 - 29 mmol/L   Calcium 9.5 8.6 - 10.2 mg/dL  LP+ALT+AST Piccolo, Waived  Result Value Ref Range   ALT (SGPT) Piccolo, Waived 16 10 - 47 U/L   AST (SGOT) Piccolo, Waived 20 11 - 38 U/L   Cholesterol Piccolo, Waived 121 <200 mg/dL   HDL Chol Piccolo, Waived 53 (L) >59 mg/dL   Triglycerides Piccolo,Waived 109 <150 mg/dL   Chol/HDL Ratio Piccolo,Waive 2.3 mg/dL   LDL Chol Calc Piccolo Waived 47 <100 mg/dL   VLDL Chol Calc Piccolo,Waive 22 <30 mg/dL  Bayer DCA Hb A1c Waived  Result Value Ref Range   Bayer DCA Hb A1c Waived 7.5 (H) <7.0 %      Assessment & Plan:   Problem List Items Addressed This Visit      Cardiovascular and Mediastinum   Hypertension - Primary    The current medical regimen is effective;  continue present plan and medications.       Relevant Orders   Bayer [facility]  Hb A1c Waived     Endocrine   Diabetes mellitus without complication ([place])    The current medical regimen is effective;  continue present plan and medications.       Relevant Orders   Bayer DCA Hb A1c Waived     Other   Hyperlipidemia    The current medical regimen is effective;  continue present plan and medications.           Follow up plan: Return in about 3 months (around 10/16/2017) for Physical Exam, Hemoglobin A1c.

## 2017-09-09 ENCOUNTER — Ambulatory Visit (INDEPENDENT_AMBULATORY_CARE_PROVIDER_SITE_OTHER): Payer: Medicare Other

## 2017-09-09 DIAGNOSIS — Z23 Encounter for immunization: Secondary | ICD-10-CM

## 2017-09-12 ENCOUNTER — Telehealth: Payer: Self-pay | Admitting: Family Medicine

## 2017-09-12 NOTE — Telephone Encounter (Signed)
Cindy from [facility] called in regards to needing a medication list for patient. They assist patient in affording medications and need current meds list.   Phone: [phone] Extension: [phone]  Fax: [phone]

## 2017-09-15 NOTE — Telephone Encounter (Signed)
Printed and faxed

## 2017-09-25 DIAGNOSIS — Z950 Presence of cardiac pacemaker: Secondary | ICD-10-CM | POA: Diagnosis not present

## 2017-09-25 DIAGNOSIS — I10 Essential (primary) hypertension: Secondary | ICD-10-CM | POA: Diagnosis not present

## 2017-09-25 DIAGNOSIS — I25701 Atherosclerosis of coronary artery bypass graft(s), unspecified, with angina pectoris with documented spasm: Secondary | ICD-10-CM | POA: Diagnosis not present

## 2017-09-25 DIAGNOSIS — S40011A Contusion of right shoulder, initial encounter: Secondary | ICD-10-CM | POA: Diagnosis not present

## 2017-10-13 ENCOUNTER — Other Ambulatory Visit: Payer: Self-pay | Admitting: Family Medicine

## 2017-10-20 ENCOUNTER — Ambulatory Visit: Payer: Medicare Other | Admitting: Family Medicine

## 2017-10-28 ENCOUNTER — Ambulatory Visit (INDEPENDENT_AMBULATORY_CARE_PROVIDER_SITE_OTHER): Payer: Medicare Other | Admitting: Family Medicine

## 2017-10-28 ENCOUNTER — Encounter: Payer: Self-pay | Admitting: Family Medicine

## 2017-10-28 VITALS — BP 138/70 | HR 86 | Ht 69.39 in | Wt 170.0 lb

## 2017-10-28 DIAGNOSIS — I25111 Atherosclerotic heart disease of native coronary artery with angina pectoris with documented spasm: Secondary | ICD-10-CM | POA: Diagnosis not present

## 2017-10-28 DIAGNOSIS — Z0000 Encounter for general adult medical examination without abnormal findings: Secondary | ICD-10-CM | POA: Diagnosis not present

## 2017-10-28 DIAGNOSIS — E119 Type 2 diabetes mellitus without complications: Secondary | ICD-10-CM

## 2017-10-28 DIAGNOSIS — I10 Essential (primary) hypertension: Secondary | ICD-10-CM

## 2017-10-28 DIAGNOSIS — Z7189 Other specified counseling: Secondary | ICD-10-CM | POA: Insufficient documentation

## 2017-10-28 DIAGNOSIS — N400 Enlarged prostate without lower urinary tract symptoms: Secondary | ICD-10-CM

## 2017-10-28 DIAGNOSIS — E039 Hypothyroidism, unspecified: Secondary | ICD-10-CM

## 2017-10-28 DIAGNOSIS — D692 Other nonthrombocytopenic purpura: Secondary | ICD-10-CM

## 2017-10-28 DIAGNOSIS — Z125 Encounter for screening for malignant neoplasm of prostate: Secondary | ICD-10-CM

## 2017-10-28 DIAGNOSIS — E785 Hyperlipidemia, unspecified: Secondary | ICD-10-CM

## 2017-10-28 LAB — URINALYSIS, ROUTINE W REFLEX MICROSCOPIC
Bilirubin, UA: NEGATIVE
Ketones, UA: NEGATIVE
Leukocytes, UA: NEGATIVE
Nitrite, UA: NEGATIVE
Protein, UA: NEGATIVE
RBC, UA: NEGATIVE
Specific Gravity, UA: 1.01 (ref 1.005–1.030)
Urobilinogen, Ur: 0.2 mg/dL (ref 0.2–1.0)
pH, UA: 5.5 (ref 5.0–7.5)

## 2017-10-28 LAB — BAYER DCA HB A1C WAIVED: HB A1C (BAYER DCA - WAIVED): 6.8 % (ref <7.0)

## 2017-10-28 MED ORDER — ATORVASTATIN CALCIUM 40 MG PO TABS
40.0000 mg | ORAL_TABLET | Freq: Every day | ORAL | 4 refills | Status: DC
Start: 2017-10-28 — End: 2018-08-13

## 2017-10-28 MED ORDER — DULOXETINE HCL 30 MG PO CPEP: 30.0000 mg | ORAL_CAPSULE | Freq: Every day | ORAL | 4 refills | Status: DC

## 2017-10-28 MED ORDER — RANITIDINE HCL 150 MG PO TABS
150.0000 mg | ORAL_TABLET | Freq: Two times a day (BID) | ORAL | 4 refills | Status: DC
Start: 2017-10-28 — End: 2018-08-13

## 2017-10-28 MED ORDER — LEVOTHYROXINE SODIUM 50 MCG PO TABS: 50.0000 ug | ORAL_TABLET | Freq: Every day | ORAL | 4 refills | Status: DC

## 2017-10-28 MED ORDER — METFORMIN HCL 500 MG PO TABS: 1000.0000 mg | ORAL_TABLET | Freq: Two times a day (BID) | ORAL | 4 refills | Status: DC

## 2017-10-28 MED ORDER — AMLODIPINE BESYLATE 5 MG PO TABS: 5.0000 mg | ORAL_TABLET | Freq: Every day | ORAL | 4 refills | Status: DC

## 2017-10-28 MED ORDER — ENALAPRIL MALEATE 20 MG PO TABS: 20.0000 mg | ORAL_TABLET | Freq: Every day | ORAL | 4 refills | Status: DC

## 2017-10-28 MED ORDER — PIOGLITAZONE HCL 45 MG PO TABS
45.0000 mg | ORAL_TABLET | Freq: Every day | ORAL | 4 refills | Status: DC
Start: 2017-10-28 — End: 2018-08-13

## 2017-10-28 NOTE — Assessment & Plan Note (Signed)
The current medical regimen is effective;  continue present plan and medications.  

## 2017-10-28 NOTE — Assessment & Plan Note (Signed)
A voluntary discussion about advance care planning including the explanation and discussion of advance directives was extensively discussed  with the patient.  Explanation about the health care proxy and Living will was reviewed and packet with forms with explanation of how to fill them out was given.    

## 2017-10-28 NOTE — Progress Notes (Signed)
BP 138/70 (BP Location: Left Arm)   Pulse 86   Ht 5' 9.39" (1.763 m)   Wt 170 lb (77.1 kg)   SpO2 99%   BMI 24.82 kg/m    Subjective:    Patient ID: Paul Parker, male    DOB: Aug 24, 1941, 76 y.o.   MRN: [id number]  HPI: Paul Parker is a 76 y.o. male  Chief Complaint  Patient presents with  . Annual Exam   Asian all in well acompanied by wife who assists with history but reviewed medical problems and medications all stable  With no issues or complaints takes without side effects. Also takes  [place]. Relevant past medical, surgical, family and social history reviewed and updated as indicated. Interim medical history since our last visit reviewed. Allergies and medications reviewed and updated.  Review of Systems  Constitutional: Negative.   HENT: Negative.   Eyes: Negative.   Respiratory: Negative.   Cardiovascular: Negative.   Gastrointestinal: Negative.   Endocrine: Negative.   Genitourinary: Negative.   Musculoskeletal: Negative.   Skin: Negative.   Allergic/Immunologic: Negative.   Neurological: Negative.   Hematological: Negative.   Psychiatric/Behavioral: Negative.     Per HPI unless specifically indicated above     Objective:    BP 138/70 (BP Location: Left Arm)   Pulse 86   Ht 5' 9.39" (1.763 m)   Wt 170 lb (77.1 kg)   SpO2 99%   BMI 24.82 kg/m   Wt Readings from Last 3 Encounters:  10/28/17 170 lb (77.1 kg)  07/16/17 168 lb (76.2 kg)  06/30/17 170 lb (77.1 kg)    Physical Exam  Constitutional: He is oriented to person, place, and time. He appears well-developed and well-nourished.  HENT:  Head: Normocephalic and atraumatic.  Right Ear: External ear normal.  Left Ear: External ear normal.  Eyes: Conjunctivae and EOM are normal. Pupils are equal, round, and reactive to light.  Neck: Normal range of motion. Neck supple.  Cardiovascular: Normal rate, regular rhythm, normal heart sounds and intact distal pulses.  Pulmonary/Chest: Effort normal and  breath sounds normal.  Abdominal: Soft. Bowel sounds are normal. There is no splenomegaly or hepatomegaly.  Genitourinary: Rectum normal and penis normal.  Genitourinary Comments: BPH changes  Musculoskeletal: Normal range of motion.  Neurological: He is alert and oriented to person, place, and time. He has normal reflexes.  Skin: No rash noted. No erythema.  Psychiatric: He has a normal mood and affect. His behavior is normal. Judgment and thought content normal.    Results for orders placed or performed in visit on 07/16/17  Bayer DCA Hb A1c Waived  Result Value Ref Range   Bayer DCA Hb A1c Waived 6.9 <7.0 %      Assessment & Plan:   Problem List Items Addressed This Visit      Cardiovascular and Mediastinum   Hypertension - Primary   Relevant Orders   CBC with Differential/Platelet   Comprehensive metabolic panel   Urinalysis, Routine w reflex microscopic   Bayer DCA Hb A1c Waived     Endocrine   Diabetes mellitus without complication ([place])   Relevant Orders   CBC with Differential/Platelet   Comprehensive metabolic panel   Urinalysis, Routine w reflex microscopic   Bayer DCA Hb A1c Waived   Hypothyroidism   Relevant Orders   TSH     Other   Hyperlipidemia   Relevant Orders   CBC with Differential/Platelet   Comprehensive metabolic panel   Lipid panel  Urinalysis, Routine w reflex microscopic   Bayer DCA Hb A1c Waived    Other Visit Diagnoses    Prostate cancer screening       Relevant Orders   PSA       Follow up plan: No Follow-up on file.

## 2017-10-28 NOTE — Assessment & Plan Note (Signed)
stable °

## 2017-10-29 ENCOUNTER — Telehealth: Payer: Self-pay | Admitting: Family Medicine

## 2017-10-29 DIAGNOSIS — D649 Anemia, unspecified: Secondary | ICD-10-CM

## 2017-10-29 DIAGNOSIS — E039 Hypothyroidism, unspecified: Secondary | ICD-10-CM

## 2017-10-29 DIAGNOSIS — R972 Elevated prostate specific antigen [PSA]: Secondary | ICD-10-CM

## 2017-10-29 LAB — CBC WITH DIFFERENTIAL/PLATELET
Basophils Absolute: 0 10*3/uL (ref 0.0–0.2)
Basos: 0 %
EOS (ABSOLUTE): 0.1 10*3/uL (ref 0.0–0.4)
Eos: 1 %
Hematocrit: 37 % — ABNORMAL LOW (ref 37.5–51.0)
Hemoglobin: 12 g/dL — ABNORMAL LOW (ref 13.0–17.7)
Immature Grans (Abs): 0 10*3/uL (ref 0.0–0.1)
Immature Granulocytes: 0 %
Lymphocytes Absolute: 1.6 10*3/uL (ref 0.7–3.1)
Lymphs: 27 %
MCH: 30.5 pg (ref 26.6–33.0)
MCHC: 32.4 g/dL (ref 31.5–35.7)
MCV: 94 fL (ref 79–97)
Monocytes Absolute: 0.6 10*3/uL (ref 0.1–0.9)
Monocytes: 10 %
Neutrophils Absolute: 3.9 10*3/uL (ref 1.4–7.0)
Neutrophils: 62 %
Platelets: 169 10*3/uL (ref 150–379)
RBC: 3.94 x10E6/uL — ABNORMAL LOW (ref 4.14–5.80)
RDW: 14.7 % (ref 12.3–15.4)
WBC: 6.2 10*3/uL (ref 3.4–10.8)

## 2017-10-29 LAB — COMPREHENSIVE METABOLIC PANEL
ALT: 14 IU/L (ref 0–44)
AST: 19 IU/L (ref 0–40)
Albumin/Globulin Ratio: 2.2 (ref 1.2–2.2)
Albumin: 4.6 g/dL (ref 3.5–4.8)
Alkaline Phosphatase: 41 IU/L (ref 39–117)
BUN/Creatinine Ratio: 19 (ref 10–24)
BUN: 16 mg/dL (ref 8–27)
Bilirubin Total: 0.3 mg/dL (ref 0.0–1.2)
CO2: 26 mmol/L (ref 20–29)
Calcium: 9.6 mg/dL (ref 8.6–10.2)
Chloride: 100 mmol/L (ref 96–106)
Creatinine, Ser: 0.83 mg/dL (ref 0.76–1.27)
GFR calc Af Amer: 99 mL/min/{1.73_m2} (ref >59)
GFR calc non Af Amer: 85 mL/min/{1.73_m2} (ref >59)
Globulin, Total: 2.1 g/dL (ref 1.5–4.5)
Glucose: 101 mg/dL — ABNORMAL HIGH (ref 65–99)
Potassium: 4.9 mmol/L (ref 3.5–5.2)
Sodium: 142 mmol/L (ref 134–144)
Total Protein: 6.7 g/dL (ref 6.0–8.5)

## 2017-10-29 LAB — TSH: TSH: 6.38 u[IU]/mL — ABNORMAL HIGH (ref 0.450–4.500)

## 2017-10-29 LAB — LIPID PANEL
Chol/HDL Ratio: 2.1 ratio (ref 0.0–5.0)
Cholesterol, Total: 121 mg/dL (ref 100–199)
HDL: 57 mg/dL (ref >39)
LDL Calculated: 51 mg/dL (ref 0–99)
Triglycerides: 67 mg/dL (ref 0–149)
VLDL Cholesterol Cal: 13 mg/dL (ref 5–40)

## 2017-10-29 LAB — PSA: Prostate Specific Ag, Serum: 5.7 ng/mL — ABNORMAL HIGH (ref 0.0–4.0)

## 2017-10-29 NOTE — Telephone Encounter (Signed)
-----   Message from Georgina Peer, [place] sent at 10/29/2017  4:57 PM EST ----- Phone call.

## 2017-10-29 NOTE — Telephone Encounter (Signed)
Phone call Discussed with patient and wife slight anemia and elevated PSA. Also TSH is elevated patient taking his thyroid faithfully Will change his levothyroxine dose from 50 g increased to 75 g Will recheck TSH PSA and CBCin 2 months.

## 2017-12-04 DIAGNOSIS — I4891 Unspecified atrial fibrillation: Secondary | ICD-10-CM | POA: Diagnosis not present

## 2017-12-04 DIAGNOSIS — E782 Mixed hyperlipidemia: Secondary | ICD-10-CM | POA: Diagnosis not present

## 2017-12-04 DIAGNOSIS — I10 Essential (primary) hypertension: Secondary | ICD-10-CM | POA: Diagnosis not present

## 2017-12-04 DIAGNOSIS — I2510 Atherosclerotic heart disease of native coronary artery without angina pectoris: Secondary | ICD-10-CM | POA: Diagnosis not present

## 2017-12-31 ENCOUNTER — Encounter: Payer: Self-pay | Admitting: Family Medicine

## 2017-12-31 ENCOUNTER — Ambulatory Visit (INDEPENDENT_AMBULATORY_CARE_PROVIDER_SITE_OTHER): Payer: Medicare Other | Admitting: Family Medicine

## 2017-12-31 VITALS — BP 124/67 | HR 76 | Temp 98.4°F | Wt 170.0 lb

## 2017-12-31 DIAGNOSIS — I10 Essential (primary) hypertension: Secondary | ICD-10-CM

## 2017-12-31 DIAGNOSIS — R6889 Other general symptoms and signs: Secondary | ICD-10-CM | POA: Diagnosis not present

## 2017-12-31 DIAGNOSIS — J019 Acute sinusitis, unspecified: Secondary | ICD-10-CM | POA: Diagnosis not present

## 2017-12-31 LAB — VERITOR FLU A/B WAIVED
Influenza A: NEGATIVE
Influenza B: NEGATIVE

## 2017-12-31 MED ORDER — AMOXICILLIN-POT CLAVULANATE 875-125 MG PO TABS: 1.0000 | ORAL_TABLET | Freq: Two times a day (BID) | ORAL | 0 refills | Status: DC

## 2017-12-31 NOTE — Progress Notes (Signed)
BP 124/67   Pulse 76   Temp 98.4 F (36.9 C) (Oral)   Wt 170 lb (77.1 kg)   SpO2 95%   BMI 24.82 kg/m    Subjective:    Patient ID: Paul Parker, male    DOB: 04/03/1941, 77 y.o.   MRN: [id number]  HPI: Paul Parker is a 77 y.o. male  Chief Complaint  Patient presents with  . URI  . Cough   Ongoing for over a week now with marked systemic symptoms of fever chills aches pains seem to get bad all at once nasal pressure congestion drainage also cough.  Has tried over-the-counter medication Tessalon left over from last year, which has helped Relevant past medical, surgical, family and social history reviewed and updated as indicated. Interim medical history since our last visit reviewed. Allergies and medications reviewed and updated.  Review of Systems  Constitutional: Positive for chills, diaphoresis, fatigue and fever.  HENT: Positive for congestion, rhinorrhea, sinus pressure, sinus pain and sneezing.   Respiratory: Positive for cough.   Cardiovascular: Negative.     Per HPI unless specifically indicated above     Objective:    BP 124/67   Pulse 76   Temp 98.4 F (36.9 C) (Oral)   Wt 170 lb (77.1 kg)   SpO2 95%   BMI 24.82 kg/m   Wt Readings from Last 3 Encounters:  12/31/17 170 lb (77.1 kg)  10/28/17 170 lb (77.1 kg)  07/16/17 168 lb (76.2 kg)    Physical Exam  Constitutional: He is oriented to person, place, and time. He appears well-developed and well-nourished.  HENT:  Head: Normocephalic and atraumatic.  Right Ear: External ear normal.  Left Ear: External ear normal.  Mouth/Throat: Oropharyngeal exudate present.  Eyes: Conjunctivae and EOM are normal.  Neck: Normal range of motion.  Cardiovascular: Normal rate, regular rhythm and normal heart sounds.  Pulmonary/Chest: Effort normal and breath sounds normal.  Musculoskeletal: Normal range of motion.  Neurological: He is alert and oriented to person, place, and time.  Skin: No erythema.  Psychiatric: He  has a normal mood and affect. His behavior is normal. Judgment and thought content normal.    Results for orders placed or performed in visit on 10/28/17  CBC with Differential/Platelet  Result Value Ref Range   WBC 6.2 3.4 - 10.8 x10E3/uL   RBC 3.94 (L) 4.14 - 5.80 x10E6/uL   Hemoglobin 12.0 (L) 13.0 - 17.7 g/dL   Hematocrit 37.0 (L) 37.5 - 51.0 %   MCV 94 79 - 97 fL   MCH 30.5 26.6 - 33.0 pg   MCHC 32.4 31.5 - 35.7 g/dL   RDW 14.7 12.3 - 15.4 %   Platelets 169 150 - 379 x10E3/uL   Neutrophils 62 Not Estab. %   Lymphs 27 Not Estab. %   Monocytes 10 Not Estab. %   Eos 1 Not Estab. %   Basos 0 Not Estab. %   Neutrophils Absolute 3.9 1.4 - 7.0 x10E3/uL   Lymphocytes Absolute 1.6 0.7 - 3.1 x10E3/uL   Monocytes Absolute 0.6 0.1 - 0.9 x10E3/uL   EOS (ABSOLUTE) 0.1 0.0 - 0.4 x10E3/uL   Basophils Absolute 0.0 0.0 - 0.2 x10E3/uL   Immature Granulocytes 0 Not Estab. %   Immature Grans (Abs) 0.0 0.0 - 0.1 x10E3/uL  Comprehensive metabolic panel  Result Value Ref Range   Glucose 101 (H) 65 - 99 mg/dL   BUN 16 8 - 27 mg/dL   Creatinine, Ser 0.83 0.76 -  1.27 mg/dL   GFR calc non Af Amer 85 >59 mL/min/1.73   GFR calc Af Amer 99 >59 mL/min/1.73   BUN/Creatinine Ratio 19 10 - 24   Sodium 142 134 - 144 mmol/L   Potassium 4.9 3.5 - 5.2 mmol/L   Chloride 100 96 - 106 mmol/L   CO2 26 20 - 29 mmol/L   Calcium 9.6 8.6 - 10.2 mg/dL   Total Protein 6.7 6.0 - 8.5 g/dL   Albumin 4.6 3.5 - 4.8 g/dL   Globulin, Total 2.1 1.5 - 4.5 g/dL   Albumin/Globulin Ratio 2.2 1.2 - 2.2   Bilirubin Total 0.3 0.0 - 1.2 mg/dL   Alkaline Phosphatase 41 39 - 117 IU/L   AST 19 0 - 40 IU/L   ALT 14 0 - 44 IU/L  Lipid panel  Result Value Ref Range   Cholesterol, Total 121 100 - 199 mg/dL   Triglycerides 67 0 - 149 mg/dL   HDL 57 >39 mg/dL   VLDL Cholesterol Cal 13 5 - 40 mg/dL   LDL Calculated 51 0 - 99 mg/dL   Chol/HDL Ratio 2.1 0.0 - 5.0 ratio  PSA  Result Value Ref Range   Prostate Specific Ag, Serum  5.7 (H) 0.0 - 4.0 ng/mL  TSH  Result Value Ref Range   TSH 6.380 (H) 0.450 - 4.500 uIU/mL  Urinalysis, Routine w reflex microscopic  Result Value Ref Range   Specific Gravity, UA 1.010 1.005 - 1.030   pH, UA 5.5 5.0 - 7.5   Color, UA Yellow Yellow   Appearance Ur Clear Clear   Leukocytes, UA Negative Negative   Protein, UA Negative Negative/Trace   Glucose, UA Trace (A) Negative   Ketones, UA Negative Negative   RBC, UA Negative Negative   Bilirubin, UA Negative Negative   Urobilinogen, Ur 0.2 0.2 - 1.0 mg/dL   Nitrite, UA Negative Negative  Bayer DCA Hb A1c Waived  Result Value Ref Range   Bayer DCA Hb A1c Waived 6.8 <7.0 %      Assessment & Plan:   Problem List Items Addressed This Visit      Cardiovascular and Mediastinum   Hypertension    The current medical regimen is effective;  continue present plan and medications.        Other Visit Diagnoses    Flu-like symptoms    -  Primary   Relevant Orders   Veritor Flu A/B Waived   Acute sinusitis, recurrence not specified, unspecified location       Relevant Medications   amoxicillin-clavulanate (AUGMENTIN) 875-125 MG tablet      Discussed sinusitis care and treatment use of Augmentin, cautions and side effects nasal rinse Mucinex Tylenol Tylenol sinus etc. Follow up plan: Return for As scheduled.

## 2017-12-31 NOTE — Assessment & Plan Note (Signed)
The current medical regimen is effective;  continue present plan and medications.  

## 2018-01-01 DIAGNOSIS — Z950 Presence of cardiac pacemaker: Secondary | ICD-10-CM | POA: Diagnosis not present

## 2018-01-01 DIAGNOSIS — I25701 Atherosclerosis of coronary artery bypass graft(s), unspecified, with angina pectoris with documented spasm: Secondary | ICD-10-CM | POA: Diagnosis not present

## 2018-01-01 DIAGNOSIS — I10 Essential (primary) hypertension: Secondary | ICD-10-CM | POA: Diagnosis not present

## 2018-01-16 ENCOUNTER — Ambulatory Visit: Payer: Medicare Other | Admitting: Unknown Physician Specialty

## 2018-01-16 ENCOUNTER — Encounter: Payer: Self-pay | Admitting: Unknown Physician Specialty

## 2018-01-16 VITALS — BP 109/67 | HR 72 | Temp 98.0°F | Wt 170.8 lb

## 2018-01-16 DIAGNOSIS — K047 Periapical abscess without sinus: Secondary | ICD-10-CM

## 2018-01-16 MED ORDER — AMOXICILLIN-POT CLAVULANATE 875-125 MG PO TABS: 1.0000 | ORAL_TABLET | Freq: Two times a day (BID) | ORAL | 0 refills | Status: DC

## 2018-01-16 NOTE — Progress Notes (Signed)
   BP 109/67   Pulse 72   Temp 98 F (36.7 C) (Oral)   Wt 170 lb 12.8 oz (77.5 kg)   SpO2 97%   BMI 24.94 kg/m    Subjective:    Patient ID: Paul Parker, male    DOB: 10-Jan-1941, 77 y.o.   MRN: [id number]  HPI: Paul Parker is a 77 y.o. male  Chief Complaint  Patient presents with  . Dental Pain    pt states he has had a toothache for a few weeks   Tooth abscess Lower front tooth broke off for a while.  Recently started hurting.  No fever.  He does have a partial for front teeth and bilateral sides.  Got some antibiotics recently from Dr.  Jeananne Rama.  Pain started after finishing the antibiotics.    Relevant past medical, surgical, family and social history reviewed and updated as indicated. Interim medical history since our last visit reviewed. Allergies and medications reviewed and updated.  Review of Systems  Constitutional: Negative.   Respiratory: Negative.   Cardiovascular: Negative.   Gastrointestinal: Negative.     Per HPI unless specifically indicated above     Objective:    BP 109/67   Pulse 72   Temp 98 F (36.7 C) (Oral)   Wt 170 lb 12.8 oz (77.5 kg)   SpO2 97%   BMI 24.94 kg/m   Wt Readings from Last 3 Encounters:  01/16/18 170 lb 12.8 oz (77.5 kg)  12/31/17 170 lb (77.1 kg)  10/28/17 170 lb (77.1 kg)    Physical Exam  Constitutional: He is oriented to person, place, and time. He appears well-developed and well-nourished. No distress.  HENT:  Head: Normocephalic and atraumatic.  Right lower front tooth broken.  Mild swelling  Eyes: Conjunctivae and lids are normal. Right eye exhibits no discharge. Left eye exhibits no discharge. No scleral icterus.  Cardiovascular: Normal rate.  Pulmonary/Chest: Effort normal.  Abdominal: Normal appearance. There is no splenomegaly or hepatomegaly.  Musculoskeletal: Normal range of motion.  Neurological: He is alert and oriented to person, place, and time.  Skin: Skin is intact. No rash noted. No pallor.    Psychiatric: He has a normal mood and affect. His behavior is normal. Judgment and thought content normal.    Results for orders placed or performed in visit on 12/31/17  Veritor Flu A/B Waived  Result Value Ref Range   Influenza A Negative Negative   Influenza B Negative Negative      Assessment & Plan:   Problem List Items Addressed This Visit    None    Visit Diagnoses    Tooth abscess    -  Primary   discussed finding a dentist.  Working on it.  Rx for Augmentin 875 mg BID.  Continue with Tylenol for pain       Follow up plan: Return if symptoms worsen or fail to improve.

## 2018-01-27 ENCOUNTER — Other Ambulatory Visit: Payer: Medicare Other

## 2018-01-27 DIAGNOSIS — D649 Anemia, unspecified: Secondary | ICD-10-CM | POA: Diagnosis not present

## 2018-01-27 DIAGNOSIS — E039 Hypothyroidism, unspecified: Secondary | ICD-10-CM | POA: Diagnosis not present

## 2018-01-27 DIAGNOSIS — R972 Elevated prostate specific antigen [PSA]: Secondary | ICD-10-CM | POA: Diagnosis not present

## 2018-01-28 ENCOUNTER — Telehealth: Payer: Self-pay | Admitting: Family Medicine

## 2018-01-28 DIAGNOSIS — R972 Elevated prostate specific antigen [PSA]: Secondary | ICD-10-CM

## 2018-01-28 DIAGNOSIS — E039 Hypothyroidism, unspecified: Secondary | ICD-10-CM

## 2018-01-28 LAB — CBC WITH DIFFERENTIAL/PLATELET
Basophils Absolute: 0 10*3/uL (ref 0.0–0.2)
Basos: 0 %
EOS (ABSOLUTE): 0.1 10*3/uL (ref 0.0–0.4)
Eos: 1 %
Hematocrit: 37.1 % — ABNORMAL LOW (ref 37.5–51.0)
Hemoglobin: 12 g/dL — ABNORMAL LOW (ref 13.0–17.7)
Immature Grans (Abs): 0 10*3/uL (ref 0.0–0.1)
Immature Granulocytes: 0 %
Lymphocytes Absolute: 1.8 10*3/uL (ref 0.7–3.1)
Lymphs: 29 %
MCH: 29.8 pg (ref 26.6–33.0)
MCHC: 32.3 g/dL (ref 31.5–35.7)
MCV: 92 fL (ref 79–97)
Monocytes Absolute: 0.5 10*3/uL (ref 0.1–0.9)
Monocytes: 9 %
Neutrophils Absolute: 3.8 10*3/uL (ref 1.4–7.0)
Neutrophils: 61 %
Platelets: 172 10*3/uL (ref 150–379)
RBC: 4.03 x10E6/uL — ABNORMAL LOW (ref 4.14–5.80)
RDW: 14.2 % (ref 12.3–15.4)
WBC: 6.1 10*3/uL (ref 3.4–10.8)

## 2018-01-28 LAB — PSA: Prostate Specific Ag, Serum: 6.1 ng/mL — ABNORMAL HIGH (ref 0.0–4.0)

## 2018-01-28 LAB — TSH: TSH: 5.53 u[IU]/mL — ABNORMAL HIGH (ref 0.450–4.500)

## 2018-01-28 MED ORDER — LEVOTHYROXINE SODIUM 75 MCG PO TABS: 75.0000 ug | ORAL_TABLET | Freq: Every day | ORAL | 1 refills | Status: DC

## 2018-01-28 NOTE — Telephone Encounter (Signed)
Left message on machine for pt to return call to the office. CRM created.  

## 2018-01-28 NOTE — Telephone Encounter (Signed)
Please let him know that his blood count is stable. His prostate marker went up a little bit- this can be from inflammation, but I'm going to send him to the urologist- so they're going to call him. His thyroid is still under active. If he's taking the medicine every day, I'm going to increase him to 48mcg and he should come back for a recheck on his levels in 6 weeks. Thanks!

## 2018-01-28 NOTE — Telephone Encounter (Signed)
Okay to wait? Please advise.

## 2018-01-28 NOTE — Telephone Encounter (Signed)
Copied from [place] [phone]. Topic: Quick Communication - Lab Results >> Jan 28, 2018 12:59 PM Amada Kingfisher, CMA wrote: Called patient to inform them of recent lab results. When patient returns call, triage nurse may disclose results.  Message from provider, "Please let him know that his blood count is stable. His prostate marker went up a little bit- this can be from inflammation, but I'm going to send him to the urologist- so they're going to call him. His thyroid is still under active. If he's taking the medicine every day, I'm going to increase him to 54mcg and he should come back for a recheck on his levels in 6 weeks. Thanks!"

## 2018-01-28 NOTE — Telephone Encounter (Signed)
Patient's wife is calling to discuss his lab results. She is concerned about the referral to urology and the medication change. She states her husband has been ill lately and she would prefer that they come in and discuss the results and changes with Dr Jeananne Rama before doing anything. She has made an appointment for 02/18/18. They want to keep everything the same until then.

## 2018-01-28 NOTE — Telephone Encounter (Signed)
OK to wait

## 2018-02-18 ENCOUNTER — Encounter: Payer: Self-pay | Admitting: Family Medicine

## 2018-02-18 ENCOUNTER — Ambulatory Visit (INDEPENDENT_AMBULATORY_CARE_PROVIDER_SITE_OTHER): Payer: Medicare Other | Admitting: Family Medicine

## 2018-02-18 VITALS — BP 122/69 | HR 72 | Ht 69.0 in | Wt 171.0 lb

## 2018-02-18 DIAGNOSIS — N400 Enlarged prostate without lower urinary tract symptoms: Secondary | ICD-10-CM

## 2018-02-18 DIAGNOSIS — E039 Hypothyroidism, unspecified: Secondary | ICD-10-CM

## 2018-02-18 DIAGNOSIS — I10 Essential (primary) hypertension: Secondary | ICD-10-CM | POA: Diagnosis not present

## 2018-02-18 MED ORDER — LEVOTHYROXINE SODIUM 88 MCG PO TABS: 88.0000 ug | ORAL_TABLET | Freq: Every day | ORAL | 3 refills | Status: DC

## 2018-02-18 NOTE — Assessment & Plan Note (Signed)
For hypothyroidism will increase levothyroxine from 75 mcg increased to 88 mcg recheck patient 2 months with repeat TSH.

## 2018-02-18 NOTE — Assessment & Plan Note (Signed)
Elevating PSA will refer to urology

## 2018-02-18 NOTE — Assessment & Plan Note (Signed)
The current medical regimen is effective;  continue present plan and medications.  

## 2018-02-18 NOTE — Progress Notes (Signed)
BP 122/69   Pulse 72   Ht 5\' 9"  (1.753 m)   Wt 171 lb (77.6 kg)   SpO2 98%   BMI 25.25 kg/m    Subjective:    Patient ID: Paul Parker, male    DOB: 1941-08-04, 77 y.o.   MRN: [id number]  HPI: Paul Parker is a 77 y.o. male  Chief Complaint  Patient presents with  . Follow-up  . Thyroid Problem   Patient with slightly elevated TSH has been taken thyroid 75 mcg without problems just taking medications appropriately. Also PSA has been elevating discussed urology referral.  Patient with no symptoms. Relevant past medical, surgical, family and social history reviewed and updated as indicated. Interim medical history since our last visit reviewed. Allergies and medications reviewed and updated.  Review of Systems  Constitutional: Negative.   Respiratory: Negative.   Cardiovascular: Negative.     Per HPI unless specifically indicated above     Objective:    BP 122/69   Pulse 72   Ht 5\' 9"  (1.753 m)   Wt 171 lb (77.6 kg)   SpO2 98%   BMI 25.25 kg/m   Wt Readings from Last 3 Encounters:  02/18/18 171 lb (77.6 kg)  01/16/18 170 lb 12.8 oz (77.5 kg)  12/31/17 170 lb (77.1 kg)    Physical Exam  Constitutional: He is oriented to person, place, and time. He appears well-developed and well-nourished.  HENT:  Head: Normocephalic and atraumatic.  Eyes: Conjunctivae and EOM are normal.  Neck: Normal range of motion.  Cardiovascular: Normal rate, regular rhythm and normal heart sounds.  Pulmonary/Chest: Effort normal and breath sounds normal.  Musculoskeletal: Normal range of motion.  Neurological: He is alert and oriented to person, place, and time.  Skin: No erythema.  Psychiatric: He has a normal mood and affect. His behavior is normal. Judgment and thought content normal.    Results for orders placed or performed in visit on 01/27/18  PSA  Result Value Ref Range   Prostate Specific Ag, Serum 6.1 (H) 0.0 - 4.0 ng/mL  CBC with Differential/Platelet  Result Value Ref Range    WBC 6.1 3.4 - 10.8 x10E3/uL   RBC 4.03 (L) 4.14 - 5.80 x10E6/uL   Hemoglobin 12.0 (L) 13.0 - 17.7 g/dL   Hematocrit 37.1 (L) 37.5 - 51.0 %   MCV 92 79 - 97 fL   MCH 29.8 26.6 - 33.0 pg   MCHC 32.3 31.5 - 35.7 g/dL   RDW 14.2 12.3 - 15.4 %   Platelets 172 150 - 379 x10E3/uL   Neutrophils 61 Not Estab. %   Lymphs 29 Not Estab. %   Monocytes 9 Not Estab. %   Eos 1 Not Estab. %   Basos 0 Not Estab. %   Neutrophils Absolute 3.8 1.4 - 7.0 x10E3/uL   Lymphocytes Absolute 1.8 0.7 - 3.1 x10E3/uL   Monocytes Absolute 0.5 0.1 - 0.9 x10E3/uL   EOS (ABSOLUTE) 0.1 0.0 - 0.4 x10E3/uL   Basophils Absolute 0.0 0.0 - 0.2 x10E3/uL   Immature Granulocytes 0 Not Estab. %   Immature Grans (Abs) 0.0 0.0 - 0.1 x10E3/uL  TSH  Result Value Ref Range   TSH 5.530 (H) 0.450 - 4.500 uIU/mL      Assessment & Plan:   Problem List Items Addressed This Visit      Cardiovascular and Mediastinum   Hypertension    The current medical regimen is effective;  continue present plan and medications.  Endocrine   Hypothyroidism - Primary    For hypothyroidism will increase levothyroxine from 75 mcg increased to 88 mcg recheck patient 2 months with repeat TSH.      Relevant Medications   levothyroxine (SYNTHROID, LEVOTHROID) 88 MCG tablet     Genitourinary   BPH (benign prostatic hyperplasia)    Elevating PSA will refer to urology      Relevant Orders   Ambulatory referral to Urology       Follow up plan: Return in about 2 months (around 04/20/2018), or if symptoms worsen or fail to improve, for TSH.

## 2018-02-20 LAB — TSH

## 2018-02-20 LAB — PSA

## 2018-02-24 ENCOUNTER — Other Ambulatory Visit: Payer: Self-pay

## 2018-02-24 DIAGNOSIS — R972 Elevated prostate specific antigen [PSA]: Secondary | ICD-10-CM

## 2018-02-24 DIAGNOSIS — N400 Enlarged prostate without lower urinary tract symptoms: Secondary | ICD-10-CM

## 2018-02-24 DIAGNOSIS — E039 Hypothyroidism, unspecified: Secondary | ICD-10-CM

## 2018-02-24 NOTE — Telephone Encounter (Signed)
Lab lost tubes. Must be redrawn. Orders replaced.

## 2018-02-25 ENCOUNTER — Other Ambulatory Visit: Payer: Medicare Other

## 2018-02-25 DIAGNOSIS — R972 Elevated prostate specific antigen [PSA]: Secondary | ICD-10-CM

## 2018-02-25 DIAGNOSIS — N400 Enlarged prostate without lower urinary tract symptoms: Secondary | ICD-10-CM

## 2018-02-25 DIAGNOSIS — E039 Hypothyroidism, unspecified: Secondary | ICD-10-CM

## 2018-03-04 NOTE — Progress Notes (Signed)
03/05/2018 2:24 PM   Paul Parker [birth year] [id number]  Referring provider: Guadalupe Maple, MD [address] [place], [place] [address]  Chief Complaint  Patient presents with  . Benign Prostatic Hypertrophy    New Patient    HPI: Patient is a 77 year old Caucasian male who is referred by Dr. Golden Parker for a rising PSA with his wife, Paul Parker and son, Paul Parker.   PSA trend 3.0 in 2017 4.7 in 2018 5.7 four months ago 6.1 one month ago   He is not having any urinary symptoms at this time.  Patient denies any gross hematuria, dysuria or suprapubic/flank pain.  Patient denies any fevers, chills, nausea or vomiting.  His PVR is 43 mL.   His UA is negative.    He has not seen an urologist in the past.  He does not have a history of nephrolithiasis, recurrent UTI's or GU surgeries/trauma.    There is no family history of prostate cancer.    His UA was unremarkable.  His PVR is 43 mL.      PMH: Past Medical History:  Diagnosis Date  . CAD (coronary artery disease)   . Diabetes mellitus without complication ([place])   . Heart murmur   . Hyperlipidemia   . Hypertension     Surgical History: Past Surgical History:  Procedure Laterality Date  . CORONARY ARTERY BYPASS GRAFT    . CORONARY STENT PLACEMENT    . HERNIA REPAIR    . PACEMAKER PLACEMENT      Home Medications:  Allergies as of 03/05/2018      Reactions   Amoxicillin Diarrhea, Nausea And Vomiting      Medication List        Accurate as of 03/05/18  2:24 PM. Always use your most recent med list.          amiodarone 200 MG tablet Commonly known as:  PACERONE Take 200 mg by mouth daily.   amLODipine 5 MG tablet Commonly known as:  NORVASC Take 1 tablet (5 mg total) by mouth daily.   aspirin EC 81 MG tablet Take 81 mg by mouth daily.   atorvastatin 40 MG tablet Commonly known as:  LIPITOR Take 1 tablet (40 mg total) by mouth daily.   DULoxetine 30 MG capsule Commonly known as:  CYMBALTA Take 1  capsule (30 mg total) by mouth daily.   enalapril 20 MG tablet Commonly known as:  VASOTEC Take 1 tablet (20 mg total) by mouth daily.   isosorbide mononitrate 30 MG 24 hr tablet Commonly known as:  IMDUR Take 30 mg by mouth daily.   levothyroxine 88 MCG tablet Commonly known as:  SYNTHROID, LEVOTHROID Take 1 tablet (88 mcg total) by mouth daily before breakfast.   metFORMIN 500 MG tablet Commonly known as:  GLUCOPHAGE Take 2 tablets (1,000 mg total) by mouth 2 (two) times daily with a meal.   ONETOUCH DELICA LANCETS 22W Misc USE TO CHECK BLOOD SUGAR ONCE A DAY   pioglitazone 45 MG tablet Commonly known as:  ACTOS Take 1 tablet (45 mg total) by mouth daily.   PLAVIX 75 MG tablet Generic drug:  clopidogrel TAKE ONE TABLET BY MOUTH ONCE DAILY   ranitidine 150 MG tablet Commonly known as:  ZANTAC Take 1 tablet (150 mg total) by mouth 2 (two) times daily.       Allergies:  Allergies  Allergen Reactions  . Amoxicillin Diarrhea and Nausea And Vomiting    Family History: Family History  Problem Relation Age of Onset  . Heart disease Mother   . Heart disease Father     Social History:  reports that he has never smoked. He has never used smokeless tobacco. He reports that he does not drink alcohol or use drugs.  ROS: UROLOGY Frequent Urination?: No Hard to postpone urination?: No Burning/pain with urination?: No Get up at night to urinate?: No Leakage of urine?: No Urine stream starts and stops?: No Trouble starting stream?: No Do you have to strain to urinate?: No Blood in urine?: No Urinary tract infection?: No Sexually transmitted disease?: No Injury to kidneys or bladder?: No Painful intercourse?: No Weak stream?: No Erection problems?: No Penile pain?: No  Gastrointestinal Nausea?: No Vomiting?: No Indigestion/heartburn?: No Diarrhea?: No Constipation?: No  Constitutional Fever: No Night sweats?: No Weight loss?: No Fatigue?:  No  Skin Skin rash/lesions?: No Itching?: No  Eyes Blurred vision?: No Double vision?: No  Ears/Nose/Throat Sore throat?: No Sinus problems?: No  Hematologic/Lymphatic Swollen glands?: No Easy bruising?: No  Cardiovascular Leg swelling?: No Chest pain?: No  Respiratory Cough?: No Shortness of breath?: No  Endocrine Excessive thirst?: No  Musculoskeletal Back pain?: No Joint pain?: No  Neurological Headaches?: No Dizziness?: No  Psychologic Depression?: No Anxiety?: No  Physical Exam: BP 129/64   Pulse 89   Ht 5\' 11"  (1.803 m)   Wt 171 lb (77.6 kg)   BMI 23.85 kg/m   Constitutional: Well nourished. Alert and oriented, No acute distress. HEENT: [place] AT, moist mucus membranes. Trachea midline, no masses. Cardiovascular: No clubbing, cyanosis, or edema. Respiratory: Normal respiratory effort, no increased work of breathing. GI: Abdomen is soft, non tender, non distended, no abdominal masses. Liver and spleen not palpable.  No hernias appreciated.  Stool sample for occult testing is not indicated.   GU: No CVA tenderness.  No bladder fullness or masses.  Patient with circumcised phallus.  Urethral meatus is patent.  No penile discharge. No penile lesions or rashes. Scrotum without lesions, cysts, rashes and/or edema.  Testicles are located scrotally bilaterally. No masses are appreciated in the testicles. Left and right epididymis are normal. Rectal: Patient with  normal sphincter tone. Anus and perineum without scarring or rashes. No rectal masses are appreciated. Prostate is approximately 55 grams, no nodules are appreciated. Seminal vesicles are normal. Skin: No rashes, bruises or suspicious lesions. Lymph: No cervical or inguinal adenopathy. Neurologic: Grossly intact, no focal deficits, moving all 4 extremities. Psychiatric: Normal mood and affect.  Laboratory Data: Lab Results  Component Value Date   WBC 6.1 01/27/2018   HGB 12.0 (L) 01/27/2018   HCT  37.1 (L) 01/27/2018   MCV 92 01/27/2018   PLT 172 01/27/2018    Lab Results  Component Value Date   CREATININE 0.83 10/28/2017    No results found for: PSA  No results found for: TESTOSTERONE  Lab Results  Component Value Date   HGBA1C 7.6 (H) 01/02/2013    Lab Results  Component Value Date   TSH CANCELED 02/18/2018       Component Value Date/Time   CHOL 121 10/28/2017 1335   CHOL 121 04/15/2017 1450   CHOL 112 01/02/2013 0605   HDL 57 10/28/2017 1335   HDL 49 01/02/2013 0605   CHOLHDL 2.1 10/28/2017 1335   VLDL 22 04/15/2017 1450   VLDL 21 01/02/2013 0605   LDLCALC 51 10/28/2017 1335   LDLCALC 42 01/02/2013 0605    Lab Results  Component Value Date   AST  19 10/28/2017   Lab Results  Component Value Date   ALT 14 10/28/2017   No components found for: ALKALINEPHOPHATASE No components found for: BILIRUBINTOTAL  No results found for: ESTRADIOL  Urinalysis See Epic.   I have reviewed the labs.   Pertinent Imaging: Results for DENSON, NICCOLI (MRN [id number]) as of 03/13/2018 14:44  Ref. Range 03/05/2018 13:58  Scan Result Unknown 57ml     Assessment & Plan:    1. Increased PSA velocity Discussed with the patient and his family that they rise and PSA velocity could be a sign of a prostate cancer.  At this time, we could continue to monitor the PSA until it reaches 10 ng/mL and pursue a biopsy at that time, pursue a biopsy at this time, repeat his PSA today and add a free and total PSA to the blood work, obtain a 4 K score, start finasteride 5 mg daily to see if it reduces the prostate size and the PSA value and recheck the PSA in one month or increase the monitoring interval. They would like to increase the monitoring interval at this time and will return in one month for PSA and office visit.    2. BPH  No urinary symptoms at this time   Return in about 1 month (around 04/04/2018) for PSA only.  These notes generated with voice recognition software. I  apologize for typographical errors.  Zara Council, PA-C  [address] [place], [place] [address] [phone]  I spent 45 with this patient of which greater than 50% was spent in counseling and coordination of care with the patient.

## 2018-03-05 ENCOUNTER — Ambulatory Visit: Payer: Medicare Other | Admitting: Urology

## 2018-03-05 ENCOUNTER — Encounter: Payer: Self-pay | Admitting: Urology

## 2018-03-05 VITALS — BP 129/64 | HR 89 | Ht 71.0 in | Wt 171.0 lb

## 2018-03-05 DIAGNOSIS — R972 Elevated prostate specific antigen [PSA]: Secondary | ICD-10-CM | POA: Diagnosis not present

## 2018-03-05 DIAGNOSIS — N400 Enlarged prostate without lower urinary tract symptoms: Secondary | ICD-10-CM | POA: Diagnosis not present

## 2018-03-05 LAB — URINALYSIS, COMPLETE
Bilirubin, UA: NEGATIVE
Leukocytes, UA: NEGATIVE
Nitrite, UA: NEGATIVE
Protein, UA: NEGATIVE
RBC, UA: NEGATIVE
Specific Gravity, UA: 1.02 (ref 1.005–1.030)
Urobilinogen, Ur: 0.2 mg/dL (ref 0.2–1.0)
pH, UA: 5.5 (ref 5.0–7.5)

## 2018-03-05 LAB — MICROSCOPIC EXAMINATION

## 2018-03-05 LAB — BLADDER SCAN AMB NON-IMAGING

## 2018-03-26 DIAGNOSIS — S40011A Contusion of right shoulder, initial encounter: Secondary | ICD-10-CM | POA: Diagnosis not present

## 2018-03-26 DIAGNOSIS — E785 Hyperlipidemia, unspecified: Secondary | ICD-10-CM | POA: Diagnosis not present

## 2018-03-26 DIAGNOSIS — I25701 Atherosclerosis of coronary artery bypass graft(s), unspecified, with angina pectoris with documented spasm: Secondary | ICD-10-CM | POA: Diagnosis not present

## 2018-03-26 DIAGNOSIS — Z950 Presence of cardiac pacemaker: Secondary | ICD-10-CM | POA: Diagnosis not present

## 2018-04-08 ENCOUNTER — Other Ambulatory Visit: Payer: Self-pay | Admitting: Family Medicine

## 2018-04-08 DIAGNOSIS — R972 Elevated prostate specific antigen [PSA]: Secondary | ICD-10-CM

## 2018-04-09 ENCOUNTER — Other Ambulatory Visit: Payer: Medicare Other

## 2018-04-09 DIAGNOSIS — R972 Elevated prostate specific antigen [PSA]: Secondary | ICD-10-CM

## 2018-04-09 NOTE — Telephone Encounter (Signed)
Pt has lancets on med list but no prescription for onetouch ultra blue test strip. Request for test strips.  LOV  02/18/18 NOV  04/27/18 Dr. Sharmaine Base [phone], [place]

## 2018-04-10 LAB — PSA: Prostate Specific Ag, Serum: 5.3 ng/mL — ABNORMAL HIGH (ref 0.0–4.0)

## 2018-04-15 DIAGNOSIS — E119 Type 2 diabetes mellitus without complications: Secondary | ICD-10-CM | POA: Diagnosis not present

## 2018-04-20 DIAGNOSIS — I4892 Unspecified atrial flutter: Secondary | ICD-10-CM | POA: Diagnosis not present

## 2018-04-20 DIAGNOSIS — E782 Mixed hyperlipidemia: Secondary | ICD-10-CM | POA: Diagnosis not present

## 2018-04-20 DIAGNOSIS — I739 Peripheral vascular disease, unspecified: Secondary | ICD-10-CM | POA: Diagnosis not present

## 2018-04-20 DIAGNOSIS — I4891 Unspecified atrial fibrillation: Secondary | ICD-10-CM | POA: Diagnosis not present

## 2018-04-22 ENCOUNTER — Telehealth: Payer: Self-pay

## 2018-04-22 ENCOUNTER — Telehealth: Payer: Self-pay | Admitting: Urology

## 2018-04-22 MED ORDER — FINASTERIDE 5 MG PO TABS: 5.0000 mg | ORAL_TABLET | Freq: Every day | ORAL | 0 refills | Status: DC

## 2018-04-22 NOTE — Telephone Encounter (Signed)
Pt wife informed.  Rx sent to [facility]

## 2018-04-22 NOTE — Telephone Encounter (Signed)
Please schedule for psa in 1 month

## 2018-04-22 NOTE — Telephone Encounter (Signed)
Spoke with patient's wife and app made  Paul Parker

## 2018-04-22 NOTE — Telephone Encounter (Signed)
-----   Message from Nori Riis, PA-C sent at 04/21/2018 11:58 AM EDT ----- Please let Paul Parker know that his PSA has returned at 5.3.  We can have him start finasteride and repeat the PSA in one month.

## 2018-04-27 ENCOUNTER — Encounter: Payer: Self-pay | Admitting: Family Medicine

## 2018-04-27 ENCOUNTER — Ambulatory Visit (INDEPENDENT_AMBULATORY_CARE_PROVIDER_SITE_OTHER): Payer: Medicare Other | Admitting: Family Medicine

## 2018-04-27 VITALS — BP 119/68 | HR 69 | Ht 71.0 in | Wt 165.9 lb

## 2018-04-27 DIAGNOSIS — E039 Hypothyroidism, unspecified: Secondary | ICD-10-CM

## 2018-04-27 DIAGNOSIS — D692 Other nonthrombocytopenic purpura: Secondary | ICD-10-CM

## 2018-04-27 DIAGNOSIS — E785 Hyperlipidemia, unspecified: Secondary | ICD-10-CM

## 2018-04-27 DIAGNOSIS — L821 Other seborrheic keratosis: Secondary | ICD-10-CM | POA: Insufficient documentation

## 2018-04-27 DIAGNOSIS — I10 Essential (primary) hypertension: Secondary | ICD-10-CM | POA: Diagnosis not present

## 2018-04-27 DIAGNOSIS — L820 Inflamed seborrheic keratosis: Secondary | ICD-10-CM | POA: Diagnosis not present

## 2018-04-27 NOTE — Assessment & Plan Note (Signed)
Multitude of lesions on arms will observe

## 2018-04-27 NOTE — Assessment & Plan Note (Signed)
Dose and change awaiting lab report

## 2018-04-27 NOTE — Addendum Note (Signed)
Addended by: Golden Pop A on: 04/27/2018 04:18 PM   Modules accepted: Orders

## 2018-04-27 NOTE — Progress Notes (Signed)
   BP 119/68   Pulse 69   Ht 5\' 11"  (1.803 m)   Wt 165 lb 14.4 oz (75.3 kg)   SpO2 97%   BMI 23.14 kg/m    Subjective:    Patient ID: Paul Parker, male    DOB: 18-Nov-1941, 77 y.o.   MRN: [id number]  HPI: Paul Parker is a 77 y.o. male  Chief Complaint  Patient presents with  . Follow-up  . Thyroid Problem  Patient doing well no complaints from thyroid dosing. Has changed dosing not noticed anything. Taking Proscar for prostate again very early in treatment has not noticed any changes. Patient does have bleeding seborrheic keratosis left lower neck area proximal to the clavicle.  This gets scratched and irritated. After informed consent with patient and wife lesion was prepped with Betadine alcohol infiltrated with Xylocaine with epinephrine and excised sent for pathology base destroyed with Surgitron unit.  Relevant past medical, surgical, family and social history reviewed and updated as indicated. Interim medical history since our last visit reviewed. Allergies and medications reviewed and updated.  Review of Systems  Constitutional: Negative.   Respiratory: Negative.   Cardiovascular: Negative.     Per HPI unless specifically indicated above     Objective:    BP 119/68   Pulse 69   Ht 5\' 11"  (1.803 m)   Wt 165 lb 14.4 oz (75.3 kg)   SpO2 97%   BMI 23.14 kg/m   Wt Readings from Last 3 Encounters:  04/27/18 165 lb 14.4 oz (75.3 kg)  03/05/18 171 lb (77.6 kg)  02/18/18 171 lb (77.6 kg)    Physical Exam  Constitutional: He is oriented to person, place, and time. He appears well-developed and well-nourished.  HENT:  Head: Normocephalic and atraumatic.  Eyes: Conjunctivae and EOM are normal.  Neck: Normal range of motion.  Cardiovascular: Normal rate, regular rhythm and normal heart sounds.  Pulmonary/Chest: Effort normal and breath sounds normal.  Musculoskeletal: Normal range of motion.  Neurological: He is alert and oriented to person, place, and time.  Skin: No  erythema.  Psychiatric: He has a normal mood and affect. His behavior is normal. Judgment and thought content normal.    Results for orders placed or performed in visit on 04/09/18  PSA  Result Value Ref Range   Prostate Specific Ag, Serum 5.3 (H) 0.0 - 4.0 ng/mL      Assessment & Plan:   Problem List Items Addressed This Visit      Cardiovascular and Mediastinum   Hypertension    The current medical regimen is effective;  continue present plan and medications.       Senile purpura (HCC)    Multitude of lesions on arms will observe        Endocrine   Hypothyroidism - Primary    Dose and change awaiting lab report      Relevant Orders   TSH     Musculoskeletal and Integument   Seborrheic keratosis    Removal patient tolerated procedure well        Other   Hyperlipidemia    The current medical regimen is effective;  continue present plan and medications.           Follow up plan: Return in about 6 months (around 10/27/2018) for Physical Exam.

## 2018-04-27 NOTE — Assessment & Plan Note (Signed)
The current medical regimen is effective;  continue present plan and medications.  

## 2018-04-27 NOTE — Assessment & Plan Note (Signed)
Removal patient tolerated procedure well

## 2018-04-28 ENCOUNTER — Other Ambulatory Visit: Payer: Self-pay | Admitting: Family Medicine

## 2018-04-28 ENCOUNTER — Ambulatory Visit: Payer: Medicare Other | Admitting: Family Medicine

## 2018-04-28 LAB — TSH: TSH: 0.192 u[IU]/mL — ABNORMAL LOW (ref 0.450–4.500)

## 2018-04-28 MED ORDER — LEVOTHYROXINE SODIUM 88 MCG PO TABS: 88.0000 ug | ORAL_TABLET | Freq: Every day | ORAL | 3 refills | Status: DC

## 2018-04-28 NOTE — Progress Notes (Signed)
Phone call Discussed with patient's wife TSH indicating not enough medication and medication increase now TSH indicating too much medication will just continue current medication and recheck TSH in a couple of months.

## 2018-05-01 LAB — PATHOLOGY

## 2018-05-07 ENCOUNTER — Telehealth: Payer: Self-pay

## 2018-05-07 MED ORDER — CLOPIDOGREL BISULFATE 75 MG PO TABS: 75.0000 mg | ORAL_TABLET | Freq: Every day | ORAL | 3 refills | Status: DC

## 2018-05-07 NOTE — Telephone Encounter (Signed)
Received a fax from the pharmacy stating that the brand Plavix is not covered. Can we send in the generic clopidogrel instead?

## 2018-05-08 DIAGNOSIS — S30863A Insect bite (nonvenomous) of scrotum and testes, initial encounter: Secondary | ICD-10-CM | POA: Diagnosis not present

## 2018-05-08 DIAGNOSIS — R21 Rash and other nonspecific skin eruption: Secondary | ICD-10-CM | POA: Diagnosis not present

## 2018-05-08 DIAGNOSIS — W57XXXA Bitten or stung by nonvenomous insect and other nonvenomous arthropods, initial encounter: Secondary | ICD-10-CM | POA: Diagnosis not present

## 2018-05-22 ENCOUNTER — Other Ambulatory Visit: Payer: Self-pay | Admitting: Family Medicine

## 2018-05-22 ENCOUNTER — Other Ambulatory Visit: Payer: Medicare Other

## 2018-05-22 DIAGNOSIS — R972 Elevated prostate specific antigen [PSA]: Secondary | ICD-10-CM

## 2018-05-23 LAB — PSA: Prostate Specific Ag, Serum: 3.8 ng/mL (ref 0.0–4.0)

## 2018-05-25 ENCOUNTER — Telehealth: Payer: Self-pay

## 2018-05-25 NOTE — Telephone Encounter (Signed)
Pt wife informed, please schedule appointment with lab prior

## 2018-05-25 NOTE — Telephone Encounter (Signed)
-----   Message from Nori Riis, PA-C sent at 05/23/2018 10:46 AM EDT ----- Please let [name] know that his PSA has decreased while on the finasteride.  I would like to see him in three months for an exam.  Please have a PSA prior to the appointment.

## 2018-06-01 ENCOUNTER — Telehealth: Payer: Self-pay | Admitting: Urology

## 2018-06-01 NOTE — Telephone Encounter (Signed)
Pt wife called office very confused about what's going on and stated husband is out of finesteride. Appts made for 3 mos follow up with PSA prior, wife asks for refills til next appt please. Thank you.

## 2018-06-02 MED ORDER — FINASTERIDE 5 MG PO TABS: 5.0000 mg | ORAL_TABLET | Freq: Every day | ORAL | 3 refills | Status: DC

## 2018-06-02 NOTE — Addendum Note (Signed)
Addended by: Garnette Gunner on: 06/02/2018 01:55 PM   Modules accepted: Orders

## 2018-06-02 NOTE — Telephone Encounter (Signed)
Spoke w/ pt wife, she was confused about medications. I informed pt I had sent a refill to [facility] for Finasteride and we would see mr Brickey in October.

## 2018-06-25 DIAGNOSIS — S40011A Contusion of right shoulder, initial encounter: Secondary | ICD-10-CM | POA: Diagnosis not present

## 2018-06-25 DIAGNOSIS — E785 Hyperlipidemia, unspecified: Secondary | ICD-10-CM | POA: Diagnosis not present

## 2018-06-25 DIAGNOSIS — Z950 Presence of cardiac pacemaker: Secondary | ICD-10-CM | POA: Diagnosis not present

## 2018-06-25 DIAGNOSIS — I25701 Atherosclerosis of coronary artery bypass graft(s), unspecified, with angina pectoris with documented spasm: Secondary | ICD-10-CM | POA: Diagnosis not present

## 2018-07-21 DIAGNOSIS — I4892 Unspecified atrial flutter: Secondary | ICD-10-CM | POA: Diagnosis not present

## 2018-07-21 DIAGNOSIS — E782 Mixed hyperlipidemia: Secondary | ICD-10-CM | POA: Diagnosis not present

## 2018-07-21 DIAGNOSIS — I739 Peripheral vascular disease, unspecified: Secondary | ICD-10-CM | POA: Diagnosis not present

## 2018-07-21 DIAGNOSIS — I4891 Unspecified atrial fibrillation: Secondary | ICD-10-CM | POA: Diagnosis not present

## 2018-07-29 ENCOUNTER — Other Ambulatory Visit: Payer: Medicare Other

## 2018-07-29 DIAGNOSIS — E039 Hypothyroidism, unspecified: Secondary | ICD-10-CM

## 2018-07-30 ENCOUNTER — Other Ambulatory Visit: Payer: Self-pay | Admitting: Family Medicine

## 2018-07-30 DIAGNOSIS — R079 Chest pain, unspecified: Secondary | ICD-10-CM | POA: Diagnosis not present

## 2018-07-30 DIAGNOSIS — R7989 Other specified abnormal findings of blood chemistry: Secondary | ICD-10-CM

## 2018-07-30 LAB — TSH: TSH: 0.193 u[IU]/mL — ABNORMAL LOW (ref 0.450–4.500)

## 2018-07-30 MED ORDER — LEVOTHYROXINE SODIUM 75 MCG PO TABS: 75.0000 ug | ORAL_TABLET | Freq: Every day | ORAL | 3 refills | Status: DC

## 2018-07-30 MED ORDER — LEVOTHYROXINE SODIUM 100 MCG PO TABS: 100.0000 ug | ORAL_TABLET | Freq: Every day | ORAL | 3 refills | Status: DC

## 2018-07-30 NOTE — Progress Notes (Signed)
Phone call Discussed with patient and wife again went the wrong way with increasing medication patient should be decreasing and will check TSH on 75 mcg of Synthroid in 2 months.

## 2018-07-30 NOTE — Progress Notes (Signed)
Phone call to patient and wife discussed thyroid medication patient is taking appropriately will increase to 100 mcg and recheck TSH 2 months

## 2018-08-06 DIAGNOSIS — E782 Mixed hyperlipidemia: Secondary | ICD-10-CM | POA: Diagnosis not present

## 2018-08-06 DIAGNOSIS — I10 Essential (primary) hypertension: Secondary | ICD-10-CM | POA: Diagnosis not present

## 2018-08-06 DIAGNOSIS — I4891 Unspecified atrial fibrillation: Secondary | ICD-10-CM | POA: Diagnosis not present

## 2018-08-06 DIAGNOSIS — I2510 Atherosclerotic heart disease of native coronary artery without angina pectoris: Secondary | ICD-10-CM | POA: Diagnosis not present

## 2018-08-13 ENCOUNTER — Encounter: Payer: Self-pay | Admitting: Family Medicine

## 2018-08-13 ENCOUNTER — Other Ambulatory Visit: Payer: Self-pay

## 2018-08-13 ENCOUNTER — Ambulatory Visit (INDEPENDENT_AMBULATORY_CARE_PROVIDER_SITE_OTHER): Payer: Medicare Other | Admitting: Family Medicine

## 2018-08-13 VITALS — BP 120/66 | HR 64 | Temp 97.6°F | Ht 71.0 in | Wt 172.0 lb

## 2018-08-13 DIAGNOSIS — E119 Type 2 diabetes mellitus without complications: Secondary | ICD-10-CM

## 2018-08-13 DIAGNOSIS — D692 Other nonthrombocytopenic purpura: Secondary | ICD-10-CM | POA: Diagnosis not present

## 2018-08-13 DIAGNOSIS — I10 Essential (primary) hypertension: Secondary | ICD-10-CM | POA: Diagnosis not present

## 2018-08-13 DIAGNOSIS — E039 Hypothyroidism, unspecified: Secondary | ICD-10-CM

## 2018-08-13 DIAGNOSIS — J069 Acute upper respiratory infection, unspecified: Secondary | ICD-10-CM

## 2018-08-13 DIAGNOSIS — E785 Hyperlipidemia, unspecified: Secondary | ICD-10-CM

## 2018-08-13 DIAGNOSIS — N400 Enlarged prostate without lower urinary tract symptoms: Secondary | ICD-10-CM | POA: Diagnosis not present

## 2018-08-13 LAB — MICROALBUMIN, URINE WAIVED
Creatinine, Urine Waived: 50 mg/dL (ref 10–300)
Microalb, Ur Waived: 10 mg/L (ref 0–19)
Microalb/Creat Ratio: <30 mg/g (ref <30)

## 2018-08-13 LAB — BAYER DCA HB A1C WAIVED: HB A1C (BAYER DCA - WAIVED): 6.7 % (ref <7.0)

## 2018-08-13 MED ORDER — RANITIDINE HCL 150 MG PO TABS: 150.0000 mg | ORAL_TABLET | Freq: Two times a day (BID) | ORAL | 1 refills | Status: DC

## 2018-08-13 MED ORDER — METFORMIN HCL 500 MG PO TABS: 1000.0000 mg | ORAL_TABLET | Freq: Two times a day (BID) | ORAL | 1 refills | Status: DC

## 2018-08-13 MED ORDER — DULOXETINE HCL 30 MG PO CPEP: 30.0000 mg | ORAL_CAPSULE | Freq: Every day | ORAL | 1 refills | Status: DC

## 2018-08-13 MED ORDER — AMLODIPINE BESYLATE 5 MG PO TABS: 5.0000 mg | ORAL_TABLET | Freq: Every day | ORAL | 1 refills | Status: DC

## 2018-08-13 MED ORDER — PIOGLITAZONE HCL 45 MG PO TABS: 45.0000 mg | ORAL_TABLET | Freq: Every day | ORAL | 1 refills | Status: DC

## 2018-08-13 MED ORDER — PREDNISONE 50 MG PO TABS: 50.0000 mg | ORAL_TABLET | Freq: Every day | ORAL | 0 refills | Status: DC

## 2018-08-13 MED ORDER — ATORVASTATIN CALCIUM 40 MG PO TABS: 40.0000 mg | ORAL_TABLET | Freq: Every day | ORAL | 1 refills | Status: DC

## 2018-08-13 MED ORDER — ENALAPRIL MALEATE 20 MG PO TABS: 20.0000 mg | ORAL_TABLET | Freq: Every day | ORAL | 1 refills | Status: DC

## 2018-08-13 MED ORDER — HYDROCOD POLST-CPM POLST ER 10-8 MG/5ML PO SUER: 5.0000 mL | Freq: Every evening | ORAL | 0 refills | Status: DC | PRN

## 2018-08-13 NOTE — Progress Notes (Signed)
BP 120/66   Pulse 64   Temp 97.6 F (36.4 C) (Oral)   Ht 5\' 11"  (1.803 m)   Wt 172 lb (78 kg)   SpO2 96%   BMI 23.99 kg/m    Subjective:    Patient ID: Paul Parker, male    DOB: May 12, 1941, 77 y.o.   MRN: [id number]  HPI: Paul Parker is a 77 y.o. male  Chief Complaint  Patient presents with  . Diabetes  . Cough    ongoing for bout a week but getting worse this last 3 days/ states have tried OTC robitussin, did not help  . Nasal Congestion  . Hypertension  . Hyperlipidemia   UPPER RESPIRATORY TRACT INFECTION Duration: 1 week Worst symptom: congestion Fever: no Cough: yes Shortness of breath: no Wheezing: no Chest pain: yes, with cough Chest tightness: no Chest congestion: no Nasal congestion: yes Runny nose: yes Post nasal drip: no Sneezing: yes Sore throat: no Swollen glands: no Sinus pressure: no Headache: no Face pain: no Toothache: no Ear pain: no  Ear pressure: no  Eyes red/itching:yes Eye drainage/crusting: no  Vomiting: no Rash: no Fatigue: yes Sick contacts: yes Strep contacts: no  Context: stable Recurrent sinusitis: no Relief with OTC cold/cough medications: no  Treatments attempted: tylenol   DIABETES Hypoglycemic episodes:no Polydipsia/polyuria: no Visual disturbance: no Chest pain: no Paresthesias: no Glucose Monitoring: no  Accucheck frequency: Not Checking Taking Insulin?: no Blood Pressure Monitoring: not checking Retinal Examination: Not up to Date Foot Exam: Up to Date Diabetic Education: Completed Pneumovax: Up to Date Influenza: Declined Aspirin: yes  HYPERTENSION / HYPERLIPIDEMIA Satisfied with current treatment? yes Duration of hypertension: chronic BP monitoring frequency: not checking BP medication side effects: no Past BP meds: amlodipine, imdur, enlalapril Duration of hyperlipidemia: chronic Cholesterol medication side effects: no Cholesterol supplements: none Past cholesterol medications:  atorvastatin Medication compliance: excellent compliance Aspirin: yes Recent stressors: no Recurrent headaches: no Visual changes: no Palpitations: no Dyspnea: no Chest pain: no Lower extremity edema: no Dizzy/lightheaded: no  HYPOTHYROIDISM Thyroid control status:stable Satisfied with current treatment? yes Medication side effects: no Medication compliance: excellent compliance Etiology of hypothyroidism:  Recent dose adjustment:no Fatigue: no Cold intolerance: no Heat intolerance: no Weight gain: no Weight loss: no Constipation: no Diarrhea/loose stools: no Palpitations: no Lower extremity edema: no Anxiety/depressed mood: no  Relevant past medical, surgical, family and social history reviewed and updated as indicated. Interim medical history since our last visit reviewed. Allergies and medications reviewed and updated.  Review of Systems  Constitutional: Negative.   HENT: Positive for congestion, postnasal drip and rhinorrhea. Negative for dental problem, drooling, ear discharge, ear pain, facial swelling, hearing loss, mouth sores, nosebleeds, sinus pressure, sinus pain, sneezing, sore throat, tinnitus, trouble swallowing and voice change.   Eyes: Negative.   Respiratory: Positive for cough. Negative for apnea, choking, chest tightness, shortness of breath, wheezing and stridor.   Cardiovascular: Negative.   Psychiatric/Behavioral: Negative.     Per HPI unless specifically indicated above     Objective:    BP 120/66   Pulse 64   Temp 97.6 F (36.4 C) (Oral)   Ht 5\' 11"  (1.803 m)   Wt 172 lb (78 kg)   SpO2 96%   BMI 23.99 kg/m   Wt Readings from Last 3 Encounters:  08/13/18 172 lb (78 kg)  04/27/18 165 lb 14.4 oz (75.3 kg)  03/05/18 171 lb (77.6 kg)    Physical Exam  Constitutional: He is oriented to person, place, and  time. He appears well-developed and well-nourished. No distress.  HENT:  Head: Normocephalic and atraumatic.  Right Ear: Hearing and  external ear normal.  Left Ear: Hearing and external ear normal.  Nose: Nose normal.  Mouth/Throat: Oropharynx is clear and moist. No oropharyngeal exudate.  Eyes: Pupils are equal, round, and reactive to light. Conjunctivae, EOM and lids are normal. Right eye exhibits no discharge. Left eye exhibits no discharge. No scleral icterus.  Neck: Normal range of motion. Neck supple. No JVD present. No tracheal deviation present. No thyromegaly present.  Cardiovascular: Normal rate, regular rhythm, normal heart sounds and intact distal pulses. Exam reveals no gallop and no friction rub.  No murmur heard. Pulmonary/Chest: Effort normal and breath sounds normal. No stridor. No respiratory distress. He has no wheezes. He has no rales. He exhibits no tenderness.  Musculoskeletal: Normal range of motion.  Lymphadenopathy:    He has no cervical adenopathy.  Neurological: He is alert and oriented to person, place, and time.  Skin: Skin is warm, dry and intact. Capillary refill takes less than 2 seconds. No rash noted. He is not diaphoretic. No erythema. No pallor.  Psychiatric: He has a normal mood and affect. His speech is normal and behavior is normal. Judgment and thought content normal. Cognition and memory are normal.    Results for orders placed or performed in visit on 08/13/18  Bayer DCA Hb A1c Waived  Result Value Ref Range   HB A1C (BAYER DCA - WAIVED) 6.7 <7.0 %  CBC with Differential/Platelet  Result Value Ref Range   WBC 7.6 3.4 - 10.8 x10E3/uL   RBC 3.78 (L) 4.14 - 5.80 x10E6/uL   Hemoglobin 11.0 (L) 13.0 - 17.7 g/dL   Hematocrit 35.0 (L) 37.5 - 51.0 %   MCV 93 79 - 97 fL   MCH 29.1 26.6 - 33.0 pg   MCHC 31.4 (L) 31.5 - 35.7 g/dL   RDW 13.3 12.3 - 15.4 %   Platelets 159 150 - 450 x10E3/uL   Neutrophils 72 Not Estab. %   Lymphs 17 Not Estab. %   Monocytes 9 Not Estab. %   Eos 2 Not Estab. %   Basos 0 Not Estab. %   Neutrophils Absolute 5.4 1.4 - 7.0 x10E3/uL   Lymphocytes  Absolute 1.3 0.7 - 3.1 x10E3/uL   Monocytes Absolute 0.6 0.1 - 0.9 x10E3/uL   EOS (ABSOLUTE) 0.1 0.0 - 0.4 x10E3/uL   Basophils Absolute 0.0 0.0 - 0.2 x10E3/uL   Immature Granulocytes 0 Not Estab. %   Immature Grans (Abs) 0.0 0.0 - 0.1 x10E3/uL  Comprehensive metabolic panel  Result Value Ref Range   Glucose 177 (H) 65 - 99 mg/dL   BUN 14 8 - 27 mg/dL   Creatinine, Ser 0.89 0.76 - 1.27 mg/dL   GFR calc non Af Amer 82 >59 mL/min/1.73   GFR calc Af Amer 95 >59 mL/min/1.73   BUN/Creatinine Ratio 16 10 - 24   Sodium 143 134 - 144 mmol/L   Potassium 4.9 3.5 - 5.2 mmol/L   Chloride 104 96 - 106 mmol/L   CO2 25 20 - 29 mmol/L   Calcium 9.5 8.6 - 10.2 mg/dL   Total Protein 6.2 6.0 - 8.5 g/dL   Albumin 4.3 3.5 - 4.8 g/dL   Globulin, Total 1.9 1.5 - 4.5 g/dL   Albumin/Globulin Ratio 2.3 (H) 1.2 - 2.2   Bilirubin Total 0.3 0.0 - 1.2 mg/dL   Alkaline Phosphatase 51 39 - 117 IU/L   AST  16 0 - 40 IU/L   ALT 15 0 - 44 IU/L  Lipid Panel w/o Chol/HDL Ratio  Result Value Ref Range   Cholesterol, Total 111 100 - 199 mg/dL   Triglycerides 97 0 - 149 mg/dL   HDL 50 >39 mg/dL   VLDL Cholesterol Cal 19 5 - 40 mg/dL   LDL Calculated 42 0 - 99 mg/dL  Microalbumin, Urine Waived  Result Value Ref Range   Microalb, Ur Waived 10 0 - 19 mg/L   Creatinine, Urine Waived 50 10 - 300 mg/dL   Microalb/Creat Ratio <30 <30 mg/g  TSH  Result Value Ref Range   TSH 0.354 (L) 0.450 - 4.500 uIU/mL  PSA  Result Value Ref Range   Prostate Specific Ag, Serum 1.4 0.0 - 4.0 ng/mL      Assessment & Plan:   Problem List Items Addressed This Visit      Cardiovascular and Mediastinum   Hypertension    Under good control on current regimen. Continue current regimen. Continue to monitor. Call with any concerns. Refills given.        Relevant Medications   amLODipine (NORVASC) 5 MG tablet   atorvastatin (LIPITOR) 40 MG tablet   enalapril (VASOTEC) 20 MG tablet   Other Relevant Orders   CBC with  Differential/Platelet (Completed)   Comprehensive metabolic panel (Completed)   Microalbumin, Urine Waived (Completed)   Senile purpura (HCC)    Checking CBC today. Call with any concerns. Continue to monitor.       Relevant Medications   amLODipine (NORVASC) 5 MG tablet   atorvastatin (LIPITOR) 40 MG tablet   enalapril (VASOTEC) 20 MG tablet   Other Relevant Orders   CBC with Differential/Platelet (Completed)   Comprehensive metabolic panel (Completed)     Endocrine   Diabetes mellitus without complication ([place])    Under good control on current regimen with A1c of 6.7. Continue current regimen. Continue to monitor. Call with any concerns. Refills given.        Relevant Medications   atorvastatin (LIPITOR) 40 MG tablet   enalapril (VASOTEC) 20 MG tablet   metFORMIN (GLUCOPHAGE) 500 MG tablet   pioglitazone (ACTOS) 45 MG tablet   Other Relevant Orders   Bayer DCA Hb A1c Waived (Completed)   CBC with Differential/Platelet (Completed)   Comprehensive metabolic panel (Completed)   Microalbumin, Urine Waived (Completed)   Hypothyroidism    Rechecking levels today. Will adjust dose as needed. Call with any concerns.       Relevant Orders   CBC with Differential/Platelet (Completed)   Comprehensive metabolic panel (Completed)   TSH (Completed)     Genitourinary   BPH (benign prostatic hyperplasia)    Under good control on current regimen. Continue current regimen. Continue to monitor. Call with any concerns. Refills given.        Relevant Orders   CBC with Differential/Platelet (Completed)   Comprehensive metabolic panel (Completed)   PSA (Completed)     Other   Hyperlipidemia    Under good control on current regimen. Continue current regimen. Continue to monitor. Call with any concerns. Refills given.        Relevant Medications   amLODipine (NORVASC) 5 MG tablet   atorvastatin (LIPITOR) 40 MG tablet   enalapril (VASOTEC) 20 MG tablet   Other Relevant Orders    CBC with Differential/Platelet (Completed)   Comprehensive metabolic panel (Completed)   Lipid Panel w/o Chol/HDL Ratio (Completed)    Other Visit Diagnoses  Viral upper respiratory tract infection    -  Primary   No sign of bacterial infection. Will treat for comfort with prednisone burst. Call if not better by early next week and we will send in Abx. Call with concerns.       Follow up plan: Return As scheduled.

## 2018-08-14 ENCOUNTER — Encounter: Payer: Self-pay | Admitting: Family Medicine

## 2018-08-14 LAB — COMPREHENSIVE METABOLIC PANEL
ALT: 15 IU/L (ref 0–44)
AST: 16 IU/L (ref 0–40)
Albumin/Globulin Ratio: 2.3 — ABNORMAL HIGH (ref 1.2–2.2)
Albumin: 4.3 g/dL (ref 3.5–4.8)
Alkaline Phosphatase: 51 IU/L (ref 39–117)
BUN/Creatinine Ratio: 16 (ref 10–24)
BUN: 14 mg/dL (ref 8–27)
Bilirubin Total: 0.3 mg/dL (ref 0.0–1.2)
CO2: 25 mmol/L (ref 20–29)
Calcium: 9.5 mg/dL (ref 8.6–10.2)
Chloride: 104 mmol/L (ref 96–106)
Creatinine, Ser: 0.89 mg/dL (ref 0.76–1.27)
GFR calc Af Amer: 95 mL/min/{1.73_m2} (ref >59)
GFR calc non Af Amer: 82 mL/min/{1.73_m2} (ref >59)
Globulin, Total: 1.9 g/dL (ref 1.5–4.5)
Glucose: 177 mg/dL — ABNORMAL HIGH (ref 65–99)
Potassium: 4.9 mmol/L (ref 3.5–5.2)
Sodium: 143 mmol/L (ref 134–144)
Total Protein: 6.2 g/dL (ref 6.0–8.5)

## 2018-08-14 LAB — LIPID PANEL W/O CHOL/HDL RATIO
Cholesterol, Total: 111 mg/dL (ref 100–199)
HDL: 50 mg/dL (ref >39)
LDL Calculated: 42 mg/dL (ref 0–99)
Triglycerides: 97 mg/dL (ref 0–149)
VLDL Cholesterol Cal: 19 mg/dL (ref 5–40)

## 2018-08-14 LAB — CBC WITH DIFFERENTIAL/PLATELET
Basophils Absolute: 0 10*3/uL (ref 0.0–0.2)
Basos: 0 %
EOS (ABSOLUTE): 0.1 10*3/uL (ref 0.0–0.4)
Eos: 2 %
Hematocrit: 35 % — ABNORMAL LOW (ref 37.5–51.0)
Hemoglobin: 11 g/dL — ABNORMAL LOW (ref 13.0–17.7)
Immature Grans (Abs): 0 10*3/uL (ref 0.0–0.1)
Immature Granulocytes: 0 %
Lymphocytes Absolute: 1.3 10*3/uL (ref 0.7–3.1)
Lymphs: 17 %
MCH: 29.1 pg (ref 26.6–33.0)
MCHC: 31.4 g/dL — ABNORMAL LOW (ref 31.5–35.7)
MCV: 93 fL (ref 79–97)
Monocytes Absolute: 0.6 10*3/uL (ref 0.1–0.9)
Monocytes: 9 %
Neutrophils Absolute: 5.4 10*3/uL (ref 1.4–7.0)
Neutrophils: 72 %
Platelets: 159 10*3/uL (ref 150–450)
RBC: 3.78 x10E6/uL — ABNORMAL LOW (ref 4.14–5.80)
RDW: 13.3 % (ref 12.3–15.4)
WBC: 7.6 10*3/uL (ref 3.4–10.8)

## 2018-08-14 LAB — TSH: TSH: 0.354 u[IU]/mL — ABNORMAL LOW (ref 0.450–4.500)

## 2018-08-14 LAB — PSA: Prostate Specific Ag, Serum: 1.4 ng/mL (ref 0.0–4.0)

## 2018-08-14 NOTE — Assessment & Plan Note (Signed)
Under good control on current regimen. Continue current regimen. Continue to monitor. Call with any concerns. Refills given.   

## 2018-08-14 NOTE — Assessment & Plan Note (Signed)
Rechecking levels today. Will adjust dose as needed. Call with any concerns.  

## 2018-08-14 NOTE — Assessment & Plan Note (Signed)
Checking CBC today. Call with any concerns. Continue to monitor.

## 2018-08-14 NOTE — Assessment & Plan Note (Signed)
Under good control on current regimen with A1c of 6.7. Continue current regimen. Continue to monitor. Call with any concerns. Refills given.

## 2018-08-17 ENCOUNTER — Telehealth: Payer: Self-pay | Admitting: Family Medicine

## 2018-08-17 ENCOUNTER — Other Ambulatory Visit: Payer: Self-pay | Admitting: Family Medicine

## 2018-08-17 DIAGNOSIS — D649 Anemia, unspecified: Secondary | ICD-10-CM

## 2018-08-17 MED ORDER — DOXYCYCLINE HYCLATE 100 MG PO TABS: 100.0000 mg | ORAL_TABLET | Freq: Two times a day (BID) | ORAL | 0 refills | Status: DC

## 2018-08-17 MED ORDER — FERROUS SULFATE 325 (65 FE) MG PO TABS: 325.0000 mg | ORAL_TABLET | Freq: Two times a day (BID) | ORAL | 3 refills | Status: DC

## 2018-08-17 MED ORDER — AZITHROMYCIN 250 MG PO TABS: ORAL_TABLET | ORAL | 0 refills | Status: DC

## 2018-08-17 NOTE — Telephone Encounter (Signed)
Please let him know that his labs are looking good except his blood count is down. I'd like him to take some iron and we'll recheck it when he comes back in to have his thyroid rechecked. Rx to his pharmacy and orders in.

## 2018-08-17 NOTE — Telephone Encounter (Signed)
Copied from [place] [id number]. Topic: General - Other >> Aug 17, 2018  1:39 PM Mcneil, Ja-Kwan wrote: Reason for CRM: Pt wife [name] states pt was told to call back for an antibiotic to called in if he had not gotten any better. [name] says pt is a little better but would like the antibiotic called in to pharmacy. [facility] [address] [place]  [phone] (Phone)  [phone] (Fax)

## 2018-08-17 NOTE — Telephone Encounter (Signed)
Message relayed to patient. Verbalized understanding. While on the phone  W/ pt and wife, wife request message be sent to provider about acute illness. Wife states that patient was told to call back if no better after recent acute visit. Wife states that URI is "way down" in his chest. Prednisone little to no effect. Wife requesting antibiotic.

## 2018-08-17 NOTE — Telephone Encounter (Signed)
Rx sent to his pharmacy

## 2018-08-27 ENCOUNTER — Other Ambulatory Visit: Payer: Self-pay | Admitting: Family Medicine

## 2018-08-27 DIAGNOSIS — R972 Elevated prostate specific antigen [PSA]: Secondary | ICD-10-CM

## 2018-09-01 ENCOUNTER — Other Ambulatory Visit: Payer: Medicare Other

## 2018-09-01 DIAGNOSIS — R972 Elevated prostate specific antigen [PSA]: Secondary | ICD-10-CM

## 2018-09-02 ENCOUNTER — Encounter: Payer: Self-pay | Admitting: Emergency Medicine

## 2018-09-02 ENCOUNTER — Emergency Department: Payer: Medicare Other

## 2018-09-02 ENCOUNTER — Emergency Department
Admission: EM | Admit: 2018-09-02 | Discharge: 2018-09-02 | Disposition: A | Discharge status: Home / Self Care | Payer: Medicare Other | Attending: Emergency Medicine | Admitting: Emergency Medicine

## 2018-09-02 ENCOUNTER — Other Ambulatory Visit: Payer: Self-pay

## 2018-09-02 DIAGNOSIS — R531 Weakness: Secondary | ICD-10-CM

## 2018-09-02 DIAGNOSIS — Z951 Presence of aortocoronary bypass graft: Secondary | ICD-10-CM | POA: Diagnosis not present

## 2018-09-02 DIAGNOSIS — Z7984 Long term (current) use of oral hypoglycemic drugs: Secondary | ICD-10-CM | POA: Diagnosis not present

## 2018-09-02 DIAGNOSIS — I2510 Atherosclerotic heart disease of native coronary artery without angina pectoris: Secondary | ICD-10-CM | POA: Diagnosis not present

## 2018-09-02 DIAGNOSIS — E119 Type 2 diabetes mellitus without complications: Secondary | ICD-10-CM | POA: Insufficient documentation

## 2018-09-02 DIAGNOSIS — Z79899 Other long term (current) drug therapy: Secondary | ICD-10-CM | POA: Diagnosis not present

## 2018-09-02 DIAGNOSIS — I10 Essential (primary) hypertension: Secondary | ICD-10-CM | POA: Insufficient documentation

## 2018-09-02 DIAGNOSIS — K8020 Calculus of gallbladder without cholecystitis without obstruction: Secondary | ICD-10-CM

## 2018-09-02 DIAGNOSIS — E039 Hypothyroidism, unspecified: Secondary | ICD-10-CM | POA: Insufficient documentation

## 2018-09-02 DIAGNOSIS — Z7902 Long term (current) use of antithrombotics/antiplatelets: Secondary | ICD-10-CM | POA: Diagnosis not present

## 2018-09-02 DIAGNOSIS — Z7982 Long term (current) use of aspirin: Secondary | ICD-10-CM | POA: Diagnosis not present

## 2018-09-02 DIAGNOSIS — Z950 Presence of cardiac pacemaker: Secondary | ICD-10-CM | POA: Insufficient documentation

## 2018-09-02 DIAGNOSIS — R109 Unspecified abdominal pain: Secondary | ICD-10-CM | POA: Diagnosis not present

## 2018-09-02 LAB — CBC
HCT: 35.8 % — ABNORMAL LOW (ref 39.0–52.0)
Hemoglobin: 11.6 g/dL — ABNORMAL LOW (ref 13.0–17.0)
MCH: 30.1 pg (ref 26.0–34.0)
MCHC: 32.4 g/dL (ref 30.0–36.0)
MCV: 92.7 fL (ref 80.0–100.0)
Platelets: 166 10*3/uL (ref 150–400)
RBC: 3.86 MIL/uL — ABNORMAL LOW (ref 4.22–5.81)
RDW: 14.6 % (ref 11.5–15.5)
WBC: 7 10*3/uL (ref 4.0–10.5)
nRBC: 0 % (ref 0.0–0.2)

## 2018-09-02 LAB — COMPREHENSIVE METABOLIC PANEL
ALT: 18 U/L (ref 0–44)
AST: 21 U/L (ref 15–41)
Albumin: 4.2 g/dL (ref 3.5–5.0)
Alkaline Phosphatase: 39 U/L (ref 38–126)
Anion gap: 10 (ref 5–15)
BUN: 19 mg/dL (ref 8–23)
CO2: 26 mmol/L (ref 22–32)
Calcium: 9.1 mg/dL (ref 8.9–10.3)
Chloride: 104 mmol/L (ref 98–111)
Creatinine, Ser: 0.77 mg/dL (ref 0.61–1.24)
GFR calc Af Amer: >60 mL/min (ref >60)
GFR calc non Af Amer: >60 mL/min (ref >60)
Glucose, Bld: 117 mg/dL — ABNORMAL HIGH (ref 70–99)
Potassium: 4.1 mmol/L (ref 3.5–5.1)
Sodium: 140 mmol/L (ref 135–145)
Total Bilirubin: 0.5 mg/dL (ref 0.3–1.2)
Total Protein: 7 g/dL (ref 6.5–8.1)

## 2018-09-02 LAB — URINALYSIS, COMPLETE (UACMP) WITH MICROSCOPIC
Bacteria, UA: NONE SEEN
Bilirubin Urine: NEGATIVE
Glucose, UA: >=500 mg/dL — AB
Hgb urine dipstick: NEGATIVE
Ketones, ur: NEGATIVE mg/dL
Leukocytes, UA: NEGATIVE
Nitrite: NEGATIVE
Protein, ur: NEGATIVE mg/dL
Specific Gravity, Urine: >1.046 — ABNORMAL HIGH (ref 1.005–1.030)
Squamous Epithelial / HPF: NONE SEEN (ref 0–5)
pH: 5 (ref 5.0–8.0)

## 2018-09-02 LAB — GLUCOSE, CAPILLARY: Glucose-Capillary: 93 mg/dL (ref 70–99)

## 2018-09-02 LAB — MAGNESIUM: Magnesium: 1.5 mg/dL — ABNORMAL LOW (ref 1.7–2.4)

## 2018-09-02 LAB — LIPASE, BLOOD: Lipase: 25 U/L (ref 11–51)

## 2018-09-02 LAB — PSA: Prostate Specific Ag, Serum: 1.8 ng/mL (ref 0.0–4.0)

## 2018-09-02 LAB — TROPONIN I: Troponin I: <0.03 ng/mL (ref <0.03)

## 2018-09-02 MED ORDER — ONDANSETRON HCL 4 MG/2ML IJ SOLN
4.0000 mg | Freq: Once | INTRAMUSCULAR | Status: AC
  Administered 2018-09-02: 4 mg via INTRAVENOUS
  Filled 2018-09-02: qty 2

## 2018-09-02 MED ORDER — MAGNESIUM SULFATE 2 GM/50ML IV SOLN
2.0000 g | Freq: Once | INTRAVENOUS | Status: AC
  Administered 2018-09-02: 2 g via INTRAVENOUS
  Filled 2018-09-02: qty 50

## 2018-09-02 MED ORDER — SODIUM CHLORIDE 0.9 % IV BOLUS
500.0000 mL | Freq: Once | INTRAVENOUS | Status: AC
  Administered 2018-09-02: 500 mL via INTRAVENOUS

## 2018-09-02 MED ORDER — IOPAMIDOL (ISOVUE-300) INJECTION 61%
100.0000 mL | Freq: Once | INTRAVENOUS | Status: AC | PRN
  Administered 2018-09-02: 100 mL via INTRAVENOUS

## 2018-09-02 NOTE — ED Notes (Signed)
Pt made aware of urine sample needed. 

## 2018-09-02 NOTE — ED Triage Notes (Signed)
Pt presents to ED via POV c/o generalized abd pain and weakness. Pt states abd pain began last Saturday night with vomiting. No vomiting today.

## 2018-09-02 NOTE — ED Provider Notes (Signed)
[facility] Emergency Department Provider Note  ____________________________________________  Time seen: Approximately 1:10 PM  I have reviewed the triage vital signs and the nursing notes.   HISTORY  Chief Complaint Weakness and Abdominal Pain   HPI Paul Parker is a 77 y.o. male history of CAD, diabetes, sick sinus syndrome status post pacemaker, diabetes, hypertension, and hyperlipidemia who presents for evaluation of generalized weakness.  Patient reports the last week he had cold-like symptoms for about 7 days.  4 days ago he was visiting his sister at the [place] when he vomited and started having abdominal pain.  He vomited again couple more times the next day.  He reports that his abdominal pain has been intermittent since then, diffuse and sharp, currently no abdominal pain.  He has not had any more vomiting for the last 2 days but has had nausea and decreased appetite.  He reports feeling very weak.  No chest pain or shortness of breath, no fever chills, no constipation or diarrhea, no dysuria or hematuria.  Past Medical History:  Diagnosis Date  . CAD (coronary artery disease)   . Diabetes mellitus without complication ([place])   . Heart murmur   . Hyperlipidemia   . Hypertension     Patient Active Problem List   Diagnosis Date Noted  . Seborrheic keratosis 04/27/2018  . Advanced care planning/counseling discussion 10/28/2017  . Senile purpura ([place]) 08/30/2015  . CAD (coronary artery disease) 08/30/2015  . Concussion with coma 08/30/2015  . BPH (benign prostatic hyperplasia) 08/30/2015  . Hypothyroidism 08/30/2015  . Depression 08/30/2015  . Diabetes mellitus without complication ([place])   . Hyperlipidemia   . Hypertension     Past Surgical History:  Procedure Laterality Date  . CORONARY ARTERY BYPASS GRAFT    . CORONARY STENT PLACEMENT    . HERNIA REPAIR    . PACEMAKER PLACEMENT      Prior to Admission medications   Medication Sig Start  Date End Date Taking? Authorizing Provider  amiodarone (PACERONE) 200 MG tablet Take 100 mg by mouth daily.     [provider]  amLODipine (NORVASC) 5 MG tablet Take 1 tablet (5 mg total) by mouth daily. 08/13/18   Park Liter P, DO  aspirin EC 81 MG tablet Take 81 mg by mouth daily.    [provider]  atorvastatin (LIPITOR) 40 MG tablet Take 1 tablet (40 mg total) by mouth daily. 08/13/18   Johnson, Megan P, DO  chlorpheniramine-HYDROcodone (TUSSIONEX PENNKINETIC ER) 10-8 MG/5ML SUER Take 5 mLs by mouth at bedtime as needed. 08/13/18   Park Liter P, DO  clopidogrel (PLAVIX) 75 MG tablet Take 1 tablet (75 mg total) by mouth daily. 05/07/18   Guadalupe Maple, MD  doxycycline (VIBRA-TABS) 100 MG tablet Take 1 tablet (100 mg total) by mouth 2 (two) times daily. 08/17/18   Johnson, Megan P, DO  DULoxetine (CYMBALTA) 30 MG capsule Take 1 capsule (30 mg total) by mouth daily. 08/13/18   Johnson, Megan P, DO  enalapril (VASOTEC) 20 MG tablet Take 1 tablet (20 mg total) by mouth daily. 08/13/18   Johnson, Megan P, DO  ferrous sulfate (FERROUSUL) 325 (65 FE) MG tablet Take 1 tablet (325 mg total) by mouth 2 (two) times daily with a meal. 08/17/18   Johnson, Megan P, DO  finasteride (PROSCAR) 5 MG tablet Take 1 tablet (5 mg total) by mouth daily. 06/02/18   Stoioff, Ronda Fairly, MD  isosorbide mononitrate (IMDUR) 30 MG 24 hr  tablet Take 30 mg by mouth daily.    [provider]  levothyroxine (SYNTHROID, LEVOTHROID) 75 MCG tablet Take 1 tablet (75 mcg total) by mouth daily before breakfast. 07/30/18   Guadalupe Maple, MD  metFORMIN (GLUCOPHAGE) 500 MG tablet Take 2 tablets (1,000 mg total) by mouth 2 (two) times daily with a meal. 08/13/18   Johnson, Megan P, DO  ONE TOUCH ULTRA TEST test strip USE TO TEST BLOOD SUGAR ONCE DAILY 04/09/18   Johnson, Megan P, DO  ONETOUCH DELICA LANCETS 97L MISC USE TO CHECK BLOOD SUGAR ONCE A DAY 03/20/16   Guadalupe Maple, MD  pioglitazone (ACTOS) 45 MG  tablet Take 1 tablet (45 mg total) by mouth daily. 08/13/18   Johnson, Megan P, DO  predniSONE (DELTASONE) 50 MG tablet Take 1 tablet (50 mg total) by mouth daily with breakfast. 08/13/18   Park Liter P, DO  ranitidine (ZANTAC) 150 MG tablet Take 1 tablet (150 mg total) by mouth 2 (two) times daily. 08/13/18   Park Liter P, DO    Allergies Amoxicillin  Family History  Problem Relation Age of Onset  . Heart disease Mother   . Heart disease Father     Social History Social History   Tobacco Use  . Smoking status: Never Smoker  . Smokeless tobacco: Never Used  Substance Use Topics  . Alcohol use: No  . Drug use: No    Review of Systems  Constitutional: Negative for fever. + generalized weakness Eyes: Negative for visual changes. ENT: Negative for sore throat. + congestion Neck: No neck pain  Cardiovascular: Negative for chest pain. Respiratory: Negative for shortness of breath.  Gastrointestinal: + abdominal pain, nausea, vomiting, decrease appetite. No diarrhea. Genitourinary: Negative for dysuria. Musculoskeletal: Negative for back pain. Skin: Negative for rash. Neurological: Negative for headaches, weakness or numbness. Psych: No SI or HI  ____________________________________________   PHYSICAL EXAM:  VITAL SIGNS: ED Triage Vitals  Enc Vitals Group     BP 09/02/18 1229 116/65     Pulse Rate 09/02/18 1229 64     Resp 09/02/18 1229 18     Temp 09/02/18 1229 98.1 F (36.7 C)     Temp Source 09/02/18 1229 Oral     SpO2 09/02/18 1229 97 %     Weight 09/02/18 1223 175 lb (79.4 kg)     Height 09/02/18 1223 5\' 11"  (1.803 m)     Head Circumference --      Peak Flow --      Pain Score 09/02/18 1225 8     Pain Loc --      Pain Edu? --      Excl. in [place]? --     Constitutional: Alert and oriented. Well appearing and in no apparent distress. HEENT:      Head: Normocephalic and atraumatic.         Eyes: Conjunctivae are normal. Sclera is non-icteric.        Mouth/Throat: Mucous membranes are moist.       Neck: Supple with no signs of meningismus. Cardiovascular: Regular rate and rhythm. No murmurs, gallops, or rubs. 2+ symmetrical distal pulses are present in all extremities. No JVD. Respiratory: Normal respiratory effort. Lungs are clear to auscultation bilaterally. No wheezes, crackles, or rhonchi.  Gastrointestinal: Soft, non tender, and non distended with positive bowel sounds. No rebound or guarding. Genitourinary: No CVA tenderness. Musculoskeletal: Nontender with normal range of motion in all extremities. No edema, cyanosis, or erythema of extremities.  Neurologic: Normal speech and language. Face is symmetric. Moving all extremities. No gross focal neurologic deficits are appreciated. Skin: Skin is warm, dry and intact. No rash noted. Psychiatric: Mood and affect are normal. Speech and behavior are normal.  ____________________________________________   LABS (all labs ordered are listed, but only abnormal results are displayed)  Labs Reviewed  COMPREHENSIVE METABOLIC PANEL - Abnormal; Notable for the following components:      Result Value   Glucose, Bld 117 (*)    All other components within normal limits  CBC - Abnormal; Notable for the following components:   RBC 3.86 (*)    Hemoglobin 11.6 (*)    HCT 35.8 (*)    All other components within normal limits  URINALYSIS, COMPLETE (UACMP) WITH MICROSCOPIC - Abnormal; Notable for the following components:   Color, Urine YELLOW (*)    APPearance CLEAR (*)    Specific Gravity, Urine >1.046 (*)    Glucose, UA >=500 (*)    All other components within normal limits  MAGNESIUM - Abnormal; Notable for the following components:   Magnesium 1.5 (*)    All other components within normal limits  LIPASE, BLOOD  GLUCOSE, CAPILLARY  TROPONIN I  CBG MONITORING, ED   ____________________________________________  EKG  ED ECG REPORT I, Rudene Re, the attending physician,  personally viewed and interpreted this ECG.  AV dual paced rhythm, rate of 63, prolonged QTC of 519, left axis deviation, no concordant ST elevations or depressions.  No significant changes when compared to prior.   ____________________________________________  RADIOLOGY  I have personally reviewed the images performed during this visit and I agree with the Radiologist's read.   Interpretation by Radiologist:  Ct Abdomen Pelvis W Contrast  Result Date: 09/02/2018 CLINICAL DATA:  Generalized abdominal pain. Pain for 24 hours. Vomiting. EXAM: CT ABDOMEN AND PELVIS WITH CONTRAST TECHNIQUE: Multidetector CT imaging of the abdomen and pelvis was performed using the standard protocol following bolus administration of intravenous contrast. CONTRAST:  147mL ISOVUE-300 IOPAMIDOL (ISOVUE-300) INJECTION 61% COMPARISON:  None. FINDINGS: Lower chest: Lung bases are clear. Hepatobiliary: No focal hepatic lesion. Two small gallstones present in the gallbladder. No gallbladder inflammation. Common bile duct normal caliber. Pancreas: Pancreas is normal. No ductal dilatation. No pancreatic inflammation. Spleen: Normal spleen Adrenals/urinary tract: Adrenal glands and kidneys are normal. The ureters and bladder normal. Stomach/Bowel: Small hiatal hernia. The stomach, small bowel and cecum normal. Post appendectomy. Ascending, transverse and descending colon normal rectosigmoid colon Vascular/Lymphatic: Abdominal aorta is normal caliber with atherosclerotic calcification. There is no retroperitoneal or periportal lymphadenopathy. No pelvic lymphadenopathy. Reproductive: Prostate gland is in enlarged. Nodular extension into the base the bladder. Other: No free fluid. Musculoskeletal: Degenerative osteophytosis of the spine. IMPRESSION: 1. No acute abdominal or pelvic findings. 2. Small gallstones without evidence cholecystitis. 3. Post appendectomy. 4. Prostate hypertrophy. 5.  Aortic Atherosclerosis (ICD10-I70.0).  Electronically Signed   By: Suzy Bouchard M.D.   On: 09/02/2018 14:43      ____________________________________________   PROCEDURES  Procedure(s) performed: None Procedures Critical Care performed:  None ____________________________________________   INITIAL IMPRESSION / ASSESSMENT AND PLAN / ED COURSE  77 y.o. male history of CAD, diabetes, sick sinus syndrome status post pacemaker, diabetes, hypertension, and hyperlipidemia who presents for evaluation of intermittent abdominal pain, nausea, vomiting, decreased appetite, and generalized weakness.  Patient is well-appearing, no distress, has normal vital signs, abdomen is soft with no tenderness throughout, no focal neurological deficits.  EKG with no evidence of dysrhythmias or ischemia.  Labs showing no evidence of AKI, electrolyte abnormalities.  Normal LFTs and lipase, normal glucose, CBC showing no evidence of leukocytosis and stable mild anemia.  UA is pending.  Differential diagnosis including viral syndrome causing dehydration versus UTI versus diverticulitis versus SBO versus GERD/gastritis.  Will give IV fluids, Zofran and reassess.  Pacemaker interrogate and no acute abnormalities seen.   Clinical Course as of Sep 02 1616  Wed Sep 02, 2018  1611 CT showing cholelithiasis with no evidence of cholecystitis and no other acute findings.  At this time patient feels markedly improved after receiving fluids.  Remaining of his labs did not show any acute abnormalities.  Patient's abdominal pain with nausea and vomiting that happened several days ago could have been due to symptomatic cholelithiasis but at this time he has no pain or tenderness, normal white count, normal CT with no evidence of any need for surgical intervention.  I explained to the patient that if he continues to have these episodes of pain with nausea and vomiting that he will need to see a surgeon.  Recommended return to the emergency room if the pain returns, if he  develops a fever, any new abdominal pain or chest pain.  Patient is in agreement.  At this time he is stable for discharge home.   [CV]    Clinical Course User Index [CV] Alfred Levins [place], MD     As part of my medical decision making, I reviewed the following data within the [place] notes reviewed and incorporated, Labs reviewed , EKG interpreted , Old EKG reviewed, Old chart reviewed, Radiograph reviewed , Notes from prior ED visits and [place] Controlled Substance Database    Pertinent labs & imaging results that were available during my care of the patient were reviewed by me and considered in my medical decision making (see chart for details).    ____________________________________________   FINAL CLINICAL IMPRESSION(S) / ED DIAGNOSES  Final diagnoses:  Generalized weakness  Abdominal pain, unspecified abdominal location  Calculus of gallbladder without cholecystitis without obstruction  Hypomagnesemia      NEW MEDICATIONS STARTED DURING THIS VISIT:  ED Discharge Orders    None       Note:  This document was prepared using Dragon voice recognition software and may include unintentional dictation errors.    Rudene Re, MD 09/02/18 [phone]

## 2018-09-02 NOTE — Discharge Instructions (Signed)
Your CT scan showed evidence of gallstones.  If your pain recurs or if you develop any other abdominal pain you need to be reevaluated in the emergency room.  Also return to the emergency room for chest pain, several episodes of vomiting, or fever.  Otherwise follow-up with your primary care doctor in 2 to 3 days.  Drink plenty of fluids at home.

## 2018-09-02 NOTE — ED Triage Notes (Signed)
First Nurse Note:  C/O feeling sick for a while and feeling weak.  Patient is AAOx3.  Skin warm and dry.  Ambulates with easy and steady gait.  Wheelchair given.

## 2018-09-03 ENCOUNTER — Ambulatory Visit: Payer: Medicare Other | Admitting: Urology

## 2018-09-03 ENCOUNTER — Encounter: Payer: Self-pay | Admitting: Urology

## 2018-09-03 VITALS — BP 118/70 | HR 69 | Ht 71.0 in | Wt 168.8 lb

## 2018-09-03 DIAGNOSIS — Z87898 Personal history of other specified conditions: Secondary | ICD-10-CM

## 2018-09-03 DIAGNOSIS — N400 Enlarged prostate without lower urinary tract symptoms: Secondary | ICD-10-CM

## 2018-09-03 MED ORDER — FINASTERIDE 5 MG PO TABS: 5.0000 mg | ORAL_TABLET | Freq: Every day | ORAL | 3 refills | Status: DC

## 2018-09-03 NOTE — Progress Notes (Signed)
09/03/2018 10:48 AM   Paul Parker 20-Mar-1941 [id number]  Referring provider: Guadalupe Maple, MD [address] [place], [place] [address]  Chief Complaint  Patient presents with  . Follow-up    HPI: Patient is a 77 year old Caucasian male who is referred by Dr. Golden Pop for a rising PSA with his wife, [name].     PSA trend  3.0 in October 2016  4.7 in November 2017  5.7 in December 2018  6.1 in March 2019  5.3 in May 2019  Started finasteride  3.8 in June 2019  1.4 (2.8) in September 2019  1.8 (3.6) in October 2019  BPH WITH LUTS  (prostate and/or bladder) IPSS score: 4/1   Previous PVR: 43 mL  Major complaint(s): None.  Denies any dysuria, hematuria or suprapubic pain.   Currently taking: finasteride 5 mg daily  Denies any recent fevers, chills, nausea or vomiting.  He does not have a family history of PCa.  IPSS    Row Name 09/03/18 1000         International Prostate Symptom Score   How often have you had the sensation of not emptying your bladder?  Not at All     How often have you had to urinate less than every two hours?  Less than 1 in 5 times     How often have you found you stopped and started again several times when you urinated?  Not at All     How often have you found it difficult to postpone urination?  Not at All     How often have you had a weak urinary stream?  Not at All     How often have you had to strain to start urination?  Not at All     How many times did you typically get up at night to urinate?  3 Times     Total IPSS Score  4       Quality of Life due to urinary symptoms   If you were to spend the rest of your life with your urinary condition just the way it is now how would you feel about that?  Pleased        Score:  1-7 Mild 8-19 Moderate 20-35 Severe      PMH: Past Medical History:  Diagnosis Date  . CAD (coronary artery disease)   . Diabetes mellitus without complication ([place])   . Heart murmur   .  Hyperlipidemia   . Hypertension     Surgical History: Past Surgical History:  Procedure Laterality Date  . CORONARY ARTERY BYPASS GRAFT    . CORONARY STENT PLACEMENT    . HERNIA REPAIR    . PACEMAKER PLACEMENT      Home Medications:  Allergies as of 09/03/2018      Reactions   Amoxicillin Diarrhea, Nausea And Vomiting      Medication List        Accurate as of 09/03/18 10:48 AM. Always use your most recent med list.          amiodarone 200 MG tablet Commonly known as:  PACERONE Take 100 mg by mouth daily.   amLODipine 5 MG tablet Commonly known as:  NORVASC Take 1 tablet (5 mg total) by mouth daily.   aspirin EC 81 MG tablet Take 81 mg by mouth daily.   atorvastatin 40 MG tablet Commonly known as:  LIPITOR Take 1 tablet (40 mg total) by mouth daily.  clopidogrel 75 MG tablet Commonly known as:  PLAVIX Take 1 tablet (75 mg total) by mouth daily.   doxycycline 100 MG tablet Commonly known as:  VIBRA-TABS Take 1 tablet (100 mg total) by mouth 2 (two) times daily.   DULoxetine 30 MG capsule Commonly known as:  CYMBALTA Take 1 capsule (30 mg total) by mouth daily.   enalapril 20 MG tablet Commonly known as:  VASOTEC Take 1 tablet (20 mg total) by mouth daily.   ferrous sulfate 325 (65 FE) MG tablet Take 1 tablet (325 mg total) by mouth 2 (two) times daily with a meal.   finasteride 5 MG tablet Commonly known as:  PROSCAR Take 1 tablet (5 mg total) by mouth daily.   isosorbide mononitrate 30 MG 24 hr tablet Commonly known as:  IMDUR Take 30 mg by mouth daily.   levothyroxine 75 MCG tablet Commonly known as:  SYNTHROID, LEVOTHROID Take 1 tablet (75 mcg total) by mouth daily before breakfast.   metFORMIN 500 MG tablet Commonly known as:  GLUCOPHAGE Take 2 tablets (1,000 mg total) by mouth 2 (two) times daily with a meal.   ONE TOUCH ULTRA TEST test strip Generic drug:  glucose blood USE TO TEST BLOOD SUGAR ONCE DAILY   ONETOUCH DELICA LANCETS  13K Misc USE TO CHECK BLOOD SUGAR ONCE A DAY   pioglitazone 45 MG tablet Commonly known as:  ACTOS Take 1 tablet (45 mg total) by mouth daily.   ranitidine 150 MG tablet Commonly known as:  ZANTAC Take 1 tablet (150 mg total) by mouth 2 (two) times daily.       Allergies:  Allergies  Allergen Reactions  . Amoxicillin Diarrhea and Nausea And Vomiting    Family History: Family History  Problem Relation Age of Onset  . Heart disease Mother   . Heart disease Father     Social History:  reports that he has never smoked. He has never used smokeless tobacco. He reports that he does not drink alcohol or use drugs.  ROS: UROLOGY Frequent Urination?: No Hard to postpone urination?: No Burning/pain with urination?: No Get up at night to urinate?: No Leakage of urine?: No Urine stream starts and stops?: No Trouble starting stream?: No Do you have to strain to urinate?: No Blood in urine?: No Urinary tract infection?: No Sexually transmitted disease?: No Injury to kidneys or bladder?: No Weak stream?: No Erection problems?: No Penile pain?: No  Gastrointestinal Nausea?: No Vomiting?: No Indigestion/heartburn?: No Diarrhea?: No Constipation?: No  Constitutional Fever: No Weight loss?: No Fatigue?: No  Skin Skin rash/lesions?: No Itching?: No  Eyes Blurred vision?: No Double vision?: No  Ears/Nose/Throat Sore throat?: No Sinus problems?: No  Hematologic/Lymphatic Swollen glands?: No Easy bruising?: No  Cardiovascular Leg swelling?: No Chest pain?: No  Respiratory Cough?: No Shortness of breath?: No  Endocrine Excessive thirst?: No  Musculoskeletal Back pain?: No Joint pain?: No  Neurological Headaches?: No Dizziness?: No  Psychologic Depression?: No Anxiety?: No  Physical Exam: BP 118/70 (BP Location: Left Arm, Patient Position: Sitting, Cuff Size: Normal)   Pulse 69   Ht 5\' 11"  (1.803 m)   Wt 168 lb 12.8 oz (76.6 kg)   BMI 23.54  kg/m   Constitutional: Well nourished. Alert and oriented, No acute distress. HEENT: [place] AT, moist mucus membranes. Trachea midline, no masses. Cardiovascular: No clubbing, cyanosis, or edema. Respiratory: Normal respiratory effort, no increased work of breathing. GI: Abdomen is soft, non tender, non distended, no abdominal masses. Liver  and spleen not palpable.  No hernias appreciated.  Stool sample for occult testing is not indicated.   GU: No CVA tenderness.  No bladder fullness or masses.  Patient with circumcised phallus. Urethral meatus is patent.  No penile discharge. No penile lesions or rashes. Scrotum without lesions, cysts, rashes and/or edema.  Testicles are located scrotally bilaterally. No masses are appreciated in the testicles. Left and right epididymis are normal. Rectal: Patient with  normal sphincter tone. Anus and perineum without scarring or rashes. No rectal masses are appreciated. Prostate is approximately 45 grams, no nodules are appreciated. Seminal vesicles are normal. Skin: No rashes, bruises or suspicious lesions. Lymph: No cervical or inguinal adenopathy. Neurologic: Grossly intact, no focal deficits, moving all 4 extremities. Psychiatric: Normal mood and affect.  Laboratory Data: Lab Results  Component Value Date   WBC 7.0 09/02/2018   HGB 11.6 (L) 09/02/2018   HCT 35.8 (L) 09/02/2018   MCV 92.7 09/02/2018   PLT 166 09/02/2018    Lab Results  Component Value Date   CREATININE 0.77 09/02/2018    No results found for: PSA  No results found for: TESTOSTERONE  Lab Results  Component Value Date   HGBA1C 7.6 (H) 01/02/2013    Lab Results  Component Value Date   TSH 0.354 (L) 08/13/2018       Component Value Date/Time   CHOL 111 08/13/2018 0953   CHOL 121 04/15/2017 1450   CHOL 112 01/02/2013 0605   HDL 50 08/13/2018 0953   HDL 49 01/02/2013 0605   CHOLHDL 2.1 10/28/2017 1335   VLDL 22 04/15/2017 1450   VLDL 21 01/02/2013 0605   LDLCALC 42  08/13/2018 0953   LDLCALC 42 01/02/2013 0605    Lab Results  Component Value Date   AST 21 09/02/2018   Lab Results  Component Value Date   ALT 18 09/02/2018   No components found for: ALKALINEPHOPHATASE No components found for: BILIRUBINTOTAL  No results found for: ESTRADIOL  I have reviewed the labs.   Assessment & Plan:    1. History of elevated PSA Current PSA 1.8 (3.6) Continue finasteride RTC in 6 months for PSA  2. BPH  No urinary symptoms at this time RTC in 6 months for I PSS, PSA and exam  Return in about 6 months (around 03/05/2019) for IPSS, PSA and exam.  These notes generated with voice recognition software. I apologize for typographical errors.  Zara Council, PA-C  [facility] [address] [place], [place] [address] [phone]

## 2018-09-09 ENCOUNTER — Ambulatory Visit (INDEPENDENT_AMBULATORY_CARE_PROVIDER_SITE_OTHER): Payer: Medicare Other

## 2018-09-09 DIAGNOSIS — Z23 Encounter for immunization: Secondary | ICD-10-CM

## 2018-09-09 NOTE — Patient Instructions (Signed)

## 2018-09-18 ENCOUNTER — Encounter: Payer: Self-pay | Admitting: Family Medicine

## 2018-09-18 ENCOUNTER — Other Ambulatory Visit: Payer: Self-pay

## 2018-09-18 ENCOUNTER — Ambulatory Visit (INDEPENDENT_AMBULATORY_CARE_PROVIDER_SITE_OTHER): Payer: Medicare Other | Admitting: Family Medicine

## 2018-09-18 VITALS — BP 120/69 | HR 62 | Temp 98.3°F | Ht 71.0 in | Wt 173.0 lb

## 2018-09-18 DIAGNOSIS — J069 Acute upper respiratory infection, unspecified: Secondary | ICD-10-CM | POA: Diagnosis not present

## 2018-09-18 MED ORDER — BENZONATATE 200 MG PO CAPS: 200.0000 mg | ORAL_CAPSULE | Freq: Two times a day (BID) | ORAL | 0 refills | Status: DC | PRN

## 2018-09-18 MED ORDER — HYDROCOD POLST-CPM POLST ER 10-8 MG/5ML PO SUER: 5.0000 mL | Freq: Every evening | ORAL | 0 refills | Status: DC | PRN

## 2018-09-18 MED ORDER — AZITHROMYCIN 250 MG PO TABS: ORAL_TABLET | ORAL | 0 refills | Status: DC

## 2018-09-18 NOTE — Progress Notes (Signed)
BP 120/69   Pulse 62   Temp 98.3 F (36.8 C) (Oral)   Ht 5\' 11"  (1.803 m)   Wt 173 lb (78.5 kg)   SpO2 98%   BMI 24.13 kg/m    Subjective:    Patient ID: Paul Parker, male    DOB: December 24, 1940, 77 y.o.   MRN: [id number]  HPI: Paul Parker is a 77 y.o. male  Chief Complaint  Patient presents with  . Cough    pt states went to the hospital 2 weeks ago with cold symptoms, they gave him IV and got better but symptoms came back since Monday night  . Sore Throat   UPPER RESPIRATORY TRACT INFECTION Duration: 1 week Worst symptom: coughing Fever: no Cough: yes Shortness of breath: no Wheezing: no Chest pain: yes, with cough Chest tightness: no Chest congestion: no Nasal congestion: yes Runny nose: yes Post nasal drip: yes Sneezing: yes Sore throat: yes Swollen glands: no Sinus pressure: no Headache: no Face pain: no Toothache: no Ear pain: no  Ear pressure: no  Eyes red/itching:yes Eye drainage/crusting: yes  Vomiting: no Rash: no Fatigue: yes Sick contacts: yes Strep contacts: no  Context: stable Recurrent sinusitis: no Relief with OTC cold/cough medications: no  Treatments attempted: robutussin   Relevant past medical, surgical, family and social history reviewed and updated as indicated. Interim medical history since our last visit reviewed. Allergies and medications reviewed and updated.  Review of Systems  Constitutional: Negative.   HENT: Positive for congestion, ear pain, postnasal drip, rhinorrhea, sneezing and sore throat. Negative for dental problem, drooling, ear discharge, facial swelling, hearing loss, mouth sores, nosebleeds, sinus pressure, sinus pain, tinnitus, trouble swallowing and voice change.   Respiratory: Positive for cough and chest tightness. Negative for apnea, choking, shortness of breath, wheezing and stridor.   Cardiovascular: Negative.     Per HPI unless specifically indicated above     Objective:    BP 120/69   Pulse 62   Temp  98.3 F (36.8 C) (Oral)   Ht 5\' 11"  (1.803 m)   Wt 173 lb (78.5 kg)   SpO2 98%   BMI 24.13 kg/m   Wt Readings from Last 3 Encounters:  09/18/18 173 lb (78.5 kg)  09/03/18 168 lb 12.8 oz (76.6 kg)  09/02/18 175 lb (79.4 kg)    Physical Exam  Constitutional: He is oriented to person, place, and time. He appears well-developed and well-nourished. No distress.  HENT:  Head: Normocephalic and atraumatic.  Right Ear: Hearing, tympanic membrane and ear canal normal. No drainage, swelling or tenderness. No middle ear effusion.  Left Ear: Hearing, tympanic membrane and ear canal normal. No drainage, swelling or tenderness.  No middle ear effusion.  Nose: Nose normal.  Mouth/Throat: Uvula is midline, oropharynx is clear and moist and mucous membranes are normal. Mucous membranes are not pale, not dry and not cyanotic. No oral lesions. No uvula swelling. No oropharyngeal exudate, posterior oropharyngeal edema, posterior oropharyngeal erythema or tonsillar abscesses. No tonsillar exudate.  Eyes: Pupils are equal, round, and reactive to light. Conjunctivae, EOM and lids are normal. Right eye exhibits no discharge. Left eye exhibits no discharge. No scleral icterus.  Neck: Normal range of motion. Neck supple. No thyromegaly present.  Cardiovascular: Normal rate, regular rhythm, normal heart sounds and intact distal pulses. Exam reveals no gallop and no friction rub.  No murmur heard. Pulmonary/Chest: Effort normal and breath sounds normal. No stridor. No respiratory distress. He has no wheezes. He has no  rhonchi. He has no rales. He exhibits no tenderness.  Abdominal: Soft. Bowel sounds are normal. He exhibits no distension and no mass. There is no tenderness. There is no rebound and no guarding. No hernia.  Musculoskeletal: Normal range of motion.  Lymphadenopathy:    He has no cervical adenopathy.  Neurological: He is alert and oriented to person, place, and time.  Skin: Skin is warm, dry and  intact. Capillary refill takes less than 2 seconds. No rash noted. He is not diaphoretic. No erythema. No pallor.  Psychiatric: He has a normal mood and affect. His speech is normal and behavior is normal. Judgment and thought content normal. Cognition and memory are normal.    Results for orders placed or performed during the hospital encounter of 09/02/18  Lipase, blood  Result Value Ref Range   Lipase 25 11 - 51 U/L  Comprehensive metabolic panel  Result Value Ref Range   Sodium 140 135 - 145 mmol/L   Potassium 4.1 3.5 - 5.1 mmol/L   Chloride 104 98 - 111 mmol/L   CO2 26 22 - 32 mmol/L   Glucose, Bld 117 (H) 70 - 99 mg/dL   BUN 19 8 - 23 mg/dL   Creatinine, Ser 0.77 0.61 - 1.24 mg/dL   Calcium 9.1 8.9 - 10.3 mg/dL   Total Protein 7.0 6.5 - 8.1 g/dL   Albumin 4.2 3.5 - 5.0 g/dL   AST 21 15 - 41 U/L   ALT 18 0 - 44 U/L   Alkaline Phosphatase 39 38 - 126 U/L   Total Bilirubin 0.5 0.3 - 1.2 mg/dL   GFR calc non Af Amer >60 >60 mL/min   GFR calc Af Amer >60 >60 mL/min   Anion gap 10 5 - 15  CBC  Result Value Ref Range   WBC 7.0 4.0 - 10.5 K/uL   RBC 3.86 (L) 4.22 - 5.81 MIL/uL   Hemoglobin 11.6 (L) 13.0 - 17.0 g/dL   HCT 35.8 (L) 39.0 - 52.0 %   MCV 92.7 80.0 - 100.0 fL   MCH 30.1 26.0 - 34.0 pg   MCHC 32.4 30.0 - 36.0 g/dL   RDW 14.6 11.5 - 15.5 %   Platelets 166 150 - 400 K/uL   nRBC 0.0 0.0 - 0.2 %  Urinalysis, Complete w Microscopic  Result Value Ref Range   Color, Urine YELLOW (A) YELLOW   APPearance CLEAR (A) CLEAR   Specific Gravity, Urine >1.046 (H) 1.005 - 1.030   pH 5.0 5.0 - 8.0   Glucose, UA >=500 (A) NEGATIVE mg/dL   Hgb urine dipstick NEGATIVE NEGATIVE   Bilirubin Urine NEGATIVE NEGATIVE   Ketones, ur NEGATIVE NEGATIVE mg/dL   Protein, ur NEGATIVE NEGATIVE mg/dL   Nitrite NEGATIVE NEGATIVE   Leukocytes, UA NEGATIVE NEGATIVE   RBC / HPF 0-5 0 - 5 RBC/hpf   WBC, UA 0-5 0 - 5 WBC/hpf   Bacteria, UA NONE SEEN NONE SEEN   Squamous Epithelial / LPF NONE  SEEN 0 - 5   Mucus PRESENT   Glucose, capillary  Result Value Ref Range   Glucose-Capillary 93 70 - 99 mg/dL  Troponin I  Result Value Ref Range   Troponin I <0.03 <0.03 ng/mL  Magnesium  Result Value Ref Range   Magnesium 1.5 (L) 1.7 - 2.4 mg/dL      Assessment & Plan:   Problem List Items Addressed This Visit    None    Visit Diagnoses    Viral upper  respiratory tract infection    -  Primary   Will give z-pack for anti-inflammatory and tussionex and tessalon perles for comfort. Call with any concerns. Continue to monitor.   Relevant Medications   azithromycin (ZITHROMAX) 250 MG tablet       Follow up plan: Return if symptoms worsen or fail to improve.

## 2018-09-30 ENCOUNTER — Ambulatory Visit: Payer: Medicare Other | Admitting: Family Medicine

## 2018-09-30 ENCOUNTER — Encounter: Payer: Self-pay | Admitting: Family Medicine

## 2018-09-30 VITALS — BP 119/69 | HR 59 | Temp 98.4°F | Ht 68.75 in | Wt 168.7 lb

## 2018-09-30 DIAGNOSIS — D649 Anemia, unspecified: Secondary | ICD-10-CM

## 2018-09-30 DIAGNOSIS — R7989 Other specified abnormal findings of blood chemistry: Secondary | ICD-10-CM

## 2018-09-30 DIAGNOSIS — E039 Hypothyroidism, unspecified: Secondary | ICD-10-CM | POA: Diagnosis not present

## 2018-09-30 DIAGNOSIS — F3342 Major depressive disorder, recurrent, in full remission: Secondary | ICD-10-CM

## 2018-09-30 NOTE — Assessment & Plan Note (Signed)
Stable

## 2018-09-30 NOTE — Assessment & Plan Note (Signed)
Will check thyroid levels to make sure stable replacement therapy

## 2018-09-30 NOTE — Progress Notes (Signed)
BP 119/69 (BP Location: Left Arm, Patient Position: Sitting, Cuff Size: Normal)   Pulse (!) 59   Temp 98.4 F (36.9 C) (Oral)   Ht 5' 8.75" (1.746 m)   Wt 168 lb 11.2 oz (76.5 kg)   SpO2 97%   BMI 25.09 kg/m    Subjective:    Patient ID: Paul Parker, male    DOB: 1941-01-24, 77 y.o.   MRN: [id number]  HPI: Paul Parker is a 77 y.o. male  Chief Complaint  Patient presents with  . Hospitalization Follow-up  Patient follow-up dehydration and IV administration while in the hospital.  Patient perked right up while in the hospital with IVs and is doing well now. Patient still has a little residual coughing from cold he had earlier this fall but otherwise doing well. Reviewed lab work still had not had follow-up thyroid or iron studies as requested we will go ahead and order those today patient otherwise doing well. Patient accompanied by his wife who assists with history.  Relevant past medical, surgical, family and social history reviewed and updated as indicated. Interim medical history since our last visit reviewed. Allergies and medications reviewed and updated.  Review of Systems  Constitutional: Negative.   Respiratory: Negative.   Cardiovascular: Negative.     Per HPI unless specifically indicated above     Objective:    BP 119/69 (BP Location: Left Arm, Patient Position: Sitting, Cuff Size: Normal)   Pulse (!) 59   Temp 98.4 F (36.9 C) (Oral)   Ht 5' 8.75" (1.746 m)   Wt 168 lb 11.2 oz (76.5 kg)   SpO2 97%   BMI 25.09 kg/m   Wt Readings from Last 3 Encounters:  09/30/18 168 lb 11.2 oz (76.5 kg)  09/18/18 173 lb (78.5 kg)  09/03/18 168 lb 12.8 oz (76.6 kg)    Physical Exam  Constitutional: He is oriented to person, place, and time. He appears well-developed and well-nourished.  HENT:  Head: Normocephalic and atraumatic.  Eyes: Conjunctivae and EOM are normal.  Neck: Normal range of motion.  Cardiovascular: Normal rate, regular rhythm and normal heart sounds.    Pulmonary/Chest: Effort normal and breath sounds normal.  Musculoskeletal: Normal range of motion.  Neurological: He is alert and oriented to person, place, and time.  Skin: No erythema.  Psychiatric: He has a normal mood and affect. His behavior is normal. Judgment and thought content normal.    Results for orders placed or performed during the hospital encounter of 09/02/18  Lipase, blood  Result Value Ref Range   Lipase 25 11 - 51 U/L  Comprehensive metabolic panel  Result Value Ref Range   Sodium 140 135 - 145 mmol/L   Potassium 4.1 3.5 - 5.1 mmol/L   Chloride 104 98 - 111 mmol/L   CO2 26 22 - 32 mmol/L   Glucose, Bld 117 (H) 70 - 99 mg/dL   BUN 19 8 - 23 mg/dL   Creatinine, Ser 0.77 0.61 - 1.24 mg/dL   Calcium 9.1 8.9 - 10.3 mg/dL   Total Protein 7.0 6.5 - 8.1 g/dL   Albumin 4.2 3.5 - 5.0 g/dL   AST 21 15 - 41 U/L   ALT 18 0 - 44 U/L   Alkaline Phosphatase 39 38 - 126 U/L   Total Bilirubin 0.5 0.3 - 1.2 mg/dL   GFR calc non Af Amer >60 >60 mL/min   GFR calc Af Amer >60 >60 mL/min   Anion gap 10 5 - 15  CBC  Result Value Ref Range   WBC 7.0 4.0 - 10.5 K/uL   RBC 3.86 (L) 4.22 - 5.81 MIL/uL   Hemoglobin 11.6 (L) 13.0 - 17.0 g/dL   HCT 35.8 (L) 39.0 - 52.0 %   MCV 92.7 80.0 - 100.0 fL   MCH 30.1 26.0 - 34.0 pg   MCHC 32.4 30.0 - 36.0 g/dL   RDW 14.6 11.5 - 15.5 %   Platelets 166 150 - 400 K/uL   nRBC 0.0 0.0 - 0.2 %  Urinalysis, Complete w Microscopic  Result Value Ref Range   Color, Urine YELLOW (A) YELLOW   APPearance CLEAR (A) CLEAR   Specific Gravity, Urine >1.046 (H) 1.005 - 1.030   pH 5.0 5.0 - 8.0   Glucose, UA >=500 (A) NEGATIVE mg/dL   Hgb urine dipstick NEGATIVE NEGATIVE   Bilirubin Urine NEGATIVE NEGATIVE   Ketones, ur NEGATIVE NEGATIVE mg/dL   Protein, ur NEGATIVE NEGATIVE mg/dL   Nitrite NEGATIVE NEGATIVE   Leukocytes, UA NEGATIVE NEGATIVE   RBC / HPF 0-5 0 - 5 RBC/hpf   WBC, UA 0-5 0 - 5 WBC/hpf   Bacteria, UA NONE SEEN NONE SEEN   Squamous  Epithelial / LPF NONE SEEN 0 - 5   Mucus PRESENT   Glucose, capillary  Result Value Ref Range   Glucose-Capillary 93 70 - 99 mg/dL  Troponin I  Result Value Ref Range   Troponin I <0.03 <0.03 ng/mL  Magnesium  Result Value Ref Range   Magnesium 1.5 (L) 1.7 - 2.4 mg/dL      Assessment & Plan:   Problem List Items Addressed This Visit      Endocrine   Hypothyroidism    Will check thyroid levels to make sure stable replacement therapy        Other   Depression    Stable       Recurrent major depressive disorder, in full remission (HCC)   Anemia - Primary    With history of anemia will check iron studies      Relevant Orders   Ferritin   Iron and TIBC    Other Visit Diagnoses    Elevated TSH       Relevant Orders   TSH    Dehydration resolved discussed prevention in the future  Follow up plan: Return if symptoms worsen or fail to improve, for As scheduled.

## 2018-09-30 NOTE — Assessment & Plan Note (Signed)
With history of anemia will check iron studies

## 2018-10-01 DIAGNOSIS — S40011A Contusion of right shoulder, initial encounter: Secondary | ICD-10-CM | POA: Diagnosis not present

## 2018-10-01 DIAGNOSIS — E785 Hyperlipidemia, unspecified: Secondary | ICD-10-CM | POA: Diagnosis not present

## 2018-10-01 DIAGNOSIS — Z950 Presence of cardiac pacemaker: Secondary | ICD-10-CM | POA: Diagnosis not present

## 2018-10-01 DIAGNOSIS — I25701 Atherosclerosis of coronary artery bypass graft(s), unspecified, with angina pectoris with documented spasm: Secondary | ICD-10-CM | POA: Diagnosis not present

## 2018-10-01 LAB — FERRITIN: Ferritin: 23 ng/mL — ABNORMAL LOW (ref 30–400)

## 2018-10-01 LAB — IRON AND TIBC
Iron Saturation: 15 % (ref 15–55)
Iron: 59 ug/dL (ref 38–169)
Total Iron Binding Capacity: 382 ug/dL (ref 250–450)
UIBC: 323 ug/dL (ref 111–343)

## 2018-10-01 LAB — TSH: TSH: 2.99 u[IU]/mL (ref 0.450–4.500)

## 2018-10-12 ENCOUNTER — Other Ambulatory Visit: Payer: Self-pay | Admitting: Family Medicine

## 2018-10-12 MED ORDER — FERROUS SULFATE 325 (65 FE) MG PO TABS: 325.0000 mg | ORAL_TABLET | Freq: Every day | ORAL | 6 refills | Status: DC

## 2018-10-12 MED ORDER — DOXYCYCLINE HYCLATE 100 MG PO TABS: 100.0000 mg | ORAL_TABLET | Freq: Two times a day (BID) | ORAL | 0 refills | Status: DC

## 2018-10-12 NOTE — Progress Notes (Signed)
Phone call Discussed with patient iron deficiency patient will start iron which he was prescribed in September but has not been taking also has a head cold will give doxycycline

## 2018-11-04 ENCOUNTER — Ambulatory Visit (INDEPENDENT_AMBULATORY_CARE_PROVIDER_SITE_OTHER): Payer: Medicare Other | Admitting: Family Medicine

## 2018-11-04 ENCOUNTER — Encounter: Payer: Self-pay | Admitting: Family Medicine

## 2018-11-04 DIAGNOSIS — Z7189 Other specified counseling: Secondary | ICD-10-CM

## 2018-11-04 DIAGNOSIS — Z0000 Encounter for general adult medical examination without abnormal findings: Secondary | ICD-10-CM | POA: Diagnosis not present

## 2018-11-04 DIAGNOSIS — N400 Enlarged prostate without lower urinary tract symptoms: Secondary | ICD-10-CM

## 2018-11-04 DIAGNOSIS — E119 Type 2 diabetes mellitus without complications: Secondary | ICD-10-CM

## 2018-11-04 DIAGNOSIS — E785 Hyperlipidemia, unspecified: Secondary | ICD-10-CM

## 2018-11-04 DIAGNOSIS — I10 Essential (primary) hypertension: Secondary | ICD-10-CM | POA: Diagnosis not present

## 2018-11-04 DIAGNOSIS — E039 Hypothyroidism, unspecified: Secondary | ICD-10-CM | POA: Diagnosis not present

## 2018-11-04 DIAGNOSIS — I25111 Atherosclerotic heart disease of native coronary artery with angina pectoris with documented spasm: Secondary | ICD-10-CM

## 2018-11-04 DIAGNOSIS — D692 Other nonthrombocytopenic purpura: Secondary | ICD-10-CM | POA: Diagnosis not present

## 2018-11-04 DIAGNOSIS — F3342 Major depressive disorder, recurrent, in full remission: Secondary | ICD-10-CM

## 2018-11-04 LAB — MICROSCOPIC EXAMINATION
Bacteria, UA: NONE SEEN
Epithelial Cells (non renal): NONE SEEN /hpf (ref 0–10)
RBC, UA: NONE SEEN /hpf (ref 0–2)
WBC, UA: NONE SEEN /hpf (ref 0–5)

## 2018-11-04 LAB — URINALYSIS, ROUTINE W REFLEX MICROSCOPIC
Bilirubin, UA: NEGATIVE
Ketones, UA: NEGATIVE
Leukocytes, UA: NEGATIVE
Nitrite, UA: NEGATIVE
Protein, UA: NEGATIVE
RBC, UA: NEGATIVE
Specific Gravity, UA: 1.015 (ref 1.005–1.030)
Urobilinogen, Ur: 0.2 mg/dL (ref 0.2–1.0)
pH, UA: 5.5 (ref 5.0–7.5)

## 2018-11-04 MED ORDER — CLOPIDOGREL BISULFATE 75 MG PO TABS: 75.0000 mg | ORAL_TABLET | Freq: Every day | ORAL | 4 refills | Status: DC

## 2018-11-04 MED ORDER — PIOGLITAZONE HCL 45 MG PO TABS: 45.0000 mg | ORAL_TABLET | Freq: Every day | ORAL | 4 refills | Status: DC

## 2018-11-04 MED ORDER — PANTOPRAZOLE SODIUM 20 MG PO TBEC: 20.0000 mg | DELAYED_RELEASE_TABLET | Freq: Every day | ORAL | 4 refills | Status: DC

## 2018-11-04 MED ORDER — DULOXETINE HCL 30 MG PO CPEP: 30.0000 mg | ORAL_CAPSULE | Freq: Every day | ORAL | 4 refills | Status: DC

## 2018-11-04 MED ORDER — ENALAPRIL MALEATE 20 MG PO TABS: 20.0000 mg | ORAL_TABLET | Freq: Every day | ORAL | 4 refills | Status: DC

## 2018-11-04 MED ORDER — AMLODIPINE BESYLATE 5 MG PO TABS: 5.0000 mg | ORAL_TABLET | Freq: Every day | ORAL | 4 refills | Status: DC

## 2018-11-04 MED ORDER — LEVOTHYROXINE SODIUM 75 MCG PO TABS: 75.0000 ug | ORAL_TABLET | Freq: Every day | ORAL | 4 refills | Status: DC

## 2018-11-04 MED ORDER — ATORVASTATIN CALCIUM 40 MG PO TABS: 40.0000 mg | ORAL_TABLET | Freq: Every day | ORAL | 4 refills | Status: DC

## 2018-11-04 MED ORDER — METFORMIN HCL 500 MG PO TABS: 1000.0000 mg | ORAL_TABLET | Freq: Two times a day (BID) | ORAL | 4 refills | Status: DC

## 2018-11-04 NOTE — Assessment & Plan Note (Signed)
The current medical regimen is effective;  continue present plan and medications.  

## 2018-11-04 NOTE — Progress Notes (Signed)
BP 107/63 (BP Location: Left Arm, Patient Position: Sitting, Cuff Size: Normal)   Pulse 64   Temp 97.8 F (36.6 C) (Oral)   Ht 5\' 9"  (1.753 m)   Wt 171 lb 6.4 oz (77.7 kg)   SpO2 97%   BMI 25.31 kg/m    Subjective:    Patient ID: Paul Parker, male    DOB: 05/21/1941, 77 y.o.   MRN: [id number]  HPI: Paul Parker is a 77 y.o. male  Chief Complaint  Patient presents with  . Annual Exam  Patient with no complaints. Reviewed medical conditions and no thyroid issues, no diabetes problems low blood sugar spells or issues. Depression nerves stable taking cholesterol and blood pressure medicine without problems also. Takes heart medicine with good control.  Relevant past medical, surgical, family and social history reviewed and updated as indicated. Interim medical history since our last visit reviewed. Allergies and medications reviewed and updated.  Review of Systems  Constitutional: Negative.   HENT: Negative.   Eyes: Negative.   Respiratory: Negative.   Cardiovascular: Negative.   Gastrointestinal: Negative.   Endocrine: Negative.   Genitourinary: Negative.   Musculoskeletal: Negative.   Skin: Negative.   Allergic/Immunologic: Negative.   Neurological: Negative.   Hematological: Negative.   Psychiatric/Behavioral: Negative.     Per HPI unless specifically indicated above     Objective:    BP 107/63 (BP Location: Left Arm, Patient Position: Sitting, Cuff Size: Normal)   Pulse 64   Temp 97.8 F (36.6 C) (Oral)   Ht 5\' 9"  (1.753 m)   Wt 171 lb 6.4 oz (77.7 kg)   SpO2 97%   BMI 25.31 kg/m   Wt Readings from Last 3 Encounters:  11/04/18 171 lb 6.4 oz (77.7 kg)  09/30/18 168 lb 11.2 oz (76.5 kg)  09/18/18 173 lb (78.5 kg)    Physical Exam  Constitutional: He is oriented to person, place, and time. He appears well-developed and well-nourished.  HENT:  Head: Normocephalic and atraumatic.  Right Ear: External ear normal.  Left Ear: External ear normal.  Eyes: Pupils  are equal, round, and reactive to light. Conjunctivae and EOM are normal.  Neck: Normal range of motion. Neck supple.  Cardiovascular: Normal rate, regular rhythm, normal heart sounds and intact distal pulses.  Pulmonary/Chest: Effort normal and breath sounds normal.  Abdominal: Soft. Bowel sounds are normal. There is no splenomegaly or hepatomegaly.  Genitourinary: Rectum normal and penis normal.  Genitourinary Comments: BPH changes  Musculoskeletal: Normal range of motion.  Neurological: He is alert and oriented to person, place, and time. He has normal reflexes.  Skin: No rash noted. No erythema.  Psychiatric: He has a normal mood and affect. His behavior is normal. Judgment and thought content normal.    Results for orders placed or performed in visit on 09/30/18  Ferritin  Result Value Ref Range   Ferritin 23 (L) 30 - 400 ng/mL  Iron and TIBC  Result Value Ref Range   Total Iron Binding Capacity 382 250 - 450 ug/dL   UIBC 323 111 - 343 ug/dL   Iron 59 38 - 169 ug/dL   Iron Saturation 15 15 - 55 %  TSH  Result Value Ref Range   TSH 2.990 0.450 - 4.500 uIU/mL      Assessment & Plan:   Problem List Items Addressed This Visit      Cardiovascular and Mediastinum   Hypertension    The current medical regimen is effective;  continue present  plan and medications.       Relevant Medications   enalapril (VASOTEC) 20 MG tablet   atorvastatin (LIPITOR) 40 MG tablet   amLODipine (NORVASC) 5 MG tablet   Senile purpura (HCC)    Stable       Relevant Medications   enalapril (VASOTEC) 20 MG tablet   atorvastatin (LIPITOR) 40 MG tablet   amLODipine (NORVASC) 5 MG tablet   CAD (coronary artery disease)    The current medical regimen is effective;  continue present plan and medications.       Relevant Medications   enalapril (VASOTEC) 20 MG tablet   atorvastatin (LIPITOR) 40 MG tablet   amLODipine (NORVASC) 5 MG tablet   clopidogrel (PLAVIX) 75 MG tablet   Other Relevant  Orders   Comprehensive metabolic panel   Lipid panel   CBC with Differential/Platelet   Urinalysis, Routine w reflex microscopic     Endocrine   Diabetes mellitus without complication ([place])    The current medical regimen is effective;  continue present plan and medications.       Relevant Medications   metFORMIN (GLUCOPHAGE) 500 MG tablet   enalapril (VASOTEC) 20 MG tablet   atorvastatin (LIPITOR) 40 MG tablet   pioglitazone (ACTOS) 45 MG tablet   Other Relevant Orders   Comprehensive metabolic panel   CBC with Differential/Platelet   Urinalysis, Routine w reflex microscopic   Hypothyroidism    The current medical regimen is effective;  continue present plan and medications.       Relevant Medications   levothyroxine (SYNTHROID, LEVOTHROID) 75 MCG tablet   Other Relevant Orders   TSH     Genitourinary   BPH (benign prostatic hyperplasia)    The current medical regimen is effective;  continue present plan and medications.         Other   Hyperlipidemia    The current medical regimen is effective;  continue present plan and medications.       Relevant Medications   enalapril (VASOTEC) 20 MG tablet   atorvastatin (LIPITOR) 40 MG tablet   amLODipine (NORVASC) 5 MG tablet   Other Relevant Orders   Lipid panel   Depression    The current medical regimen is effective;  continue present plan and medications.       Relevant Medications   DULoxetine (CYMBALTA) 30 MG capsule   Advanced care planning/counseling discussion    A voluntary discussion about advanced care planning including explanation and discussion of advanced directives was extentively discussed with the patient.  Explained about the healthcare proxy and living will was reviewed and packet with forms with expiration of how to fill them out was given.  Time spent: Encounter 16+ min individuals present: Patient, wife          Follow up plan: Return in about 6 months (around 05/06/2019) for Hemoglobin  A1c, BMP,  Lipids, ALT, AST.

## 2018-11-04 NOTE — Assessment & Plan Note (Addendum)
A voluntary discussion about advanced care planning including explanation and discussion of advanced directives was extentively discussed with the patient.  Explained about the healthcare proxy and living will was reviewed and packet with forms with expiration of how to fill them out was given.  Time spent: Encounter 16+ min individuals present: Patient, wife

## 2018-11-04 NOTE — Assessment & Plan Note (Signed)
Stable

## 2018-11-05 ENCOUNTER — Encounter: Payer: Self-pay | Admitting: Family Medicine

## 2018-11-05 LAB — LIPID PANEL
Chol/HDL Ratio: 2.2 ratio (ref 0.0–5.0)
Cholesterol, Total: 137 mg/dL (ref 100–199)
HDL: 62 mg/dL (ref >39)
LDL Calculated: 61 mg/dL (ref 0–99)
Triglycerides: 69 mg/dL (ref 0–149)
VLDL Cholesterol Cal: 14 mg/dL (ref 5–40)

## 2018-11-05 LAB — COMPREHENSIVE METABOLIC PANEL
ALT: 16 IU/L (ref 0–44)
AST: 19 IU/L (ref 0–40)
Albumin/Globulin Ratio: 2.6 — ABNORMAL HIGH (ref 1.2–2.2)
Albumin: 4.7 g/dL (ref 3.5–4.8)
Alkaline Phosphatase: 46 IU/L (ref 39–117)
BUN/Creatinine Ratio: 22 (ref 10–24)
BUN: 21 mg/dL (ref 8–27)
Bilirubin Total: 0.4 mg/dL (ref 0.0–1.2)
CO2: 25 mmol/L (ref 20–29)
Calcium: 9.8 mg/dL (ref 8.6–10.2)
Chloride: 101 mmol/L (ref 96–106)
Creatinine, Ser: 0.94 mg/dL (ref 0.76–1.27)
GFR calc Af Amer: 90 mL/min/{1.73_m2} (ref >59)
GFR calc non Af Amer: 78 mL/min/{1.73_m2} (ref >59)
Globulin, Total: 1.8 g/dL (ref 1.5–4.5)
Glucose: 152 mg/dL — ABNORMAL HIGH (ref 65–99)
Potassium: 4.9 mmol/L (ref 3.5–5.2)
Sodium: 140 mmol/L (ref 134–144)
Total Protein: 6.5 g/dL (ref 6.0–8.5)

## 2018-11-05 LAB — CBC WITH DIFFERENTIAL/PLATELET
Basophils Absolute: 0 10*3/uL (ref 0.0–0.2)
Basos: 0 %
EOS (ABSOLUTE): 0.1 10*3/uL (ref 0.0–0.4)
Eos: 1 %
Hematocrit: 36.5 % — ABNORMAL LOW (ref 37.5–51.0)
Hemoglobin: 11.7 g/dL — ABNORMAL LOW (ref 13.0–17.7)
Immature Grans (Abs): 0 10*3/uL (ref 0.0–0.1)
Immature Granulocytes: 0 %
Lymphocytes Absolute: 1.9 10*3/uL (ref 0.7–3.1)
Lymphs: 28 %
MCH: 29.1 pg (ref 26.6–33.0)
MCHC: 32.1 g/dL (ref 31.5–35.7)
MCV: 91 fL (ref 79–97)
Monocytes Absolute: 0.7 10*3/uL (ref 0.1–0.9)
Monocytes: 10 %
Neutrophils Absolute: 4.1 10*3/uL (ref 1.4–7.0)
Neutrophils: 61 %
Platelets: 171 10*3/uL (ref 150–450)
RBC: 4.02 x10E6/uL — ABNORMAL LOW (ref 4.14–5.80)
RDW: 13.7 % (ref 12.3–15.4)
WBC: 6.8 10*3/uL (ref 3.4–10.8)

## 2018-11-05 LAB — TSH: TSH: 2.07 u[IU]/mL (ref 0.450–4.500)

## 2018-12-03 ENCOUNTER — Other Ambulatory Visit: Payer: Self-pay

## 2018-12-03 ENCOUNTER — Ambulatory Visit: Payer: Medicare Other | Admitting: Family Medicine

## 2018-12-03 ENCOUNTER — Encounter: Payer: Self-pay | Admitting: Family Medicine

## 2018-12-03 VITALS — BP 120/66 | HR 62 | Temp 98.1°F | Ht 69.0 in | Wt 180.0 lb

## 2018-12-03 DIAGNOSIS — J069 Acute upper respiratory infection, unspecified: Secondary | ICD-10-CM | POA: Diagnosis not present

## 2018-12-03 MED ORDER — FLUTICASONE PROPIONATE 50 MCG/ACT NA SUSP: 2.0000 | Freq: Two times a day (BID) | NASAL | 6 refills | Status: DC

## 2018-12-03 MED ORDER — DOXYCYCLINE HYCLATE 100 MG PO TABS: 100.0000 mg | ORAL_TABLET | Freq: Two times a day (BID) | ORAL | 0 refills | Status: DC

## 2018-12-03 MED ORDER — CETIRIZINE HCL 10 MG PO TABS: 10.0000 mg | ORAL_TABLET | Freq: Every day | ORAL | 11 refills | Status: DC

## 2018-12-03 MED ORDER — PROMETHAZINE HCL 6.25 MG/5ML PO SYRP: 6.2500 mg | ORAL_SOLUTION | Freq: Every evening | ORAL | 0 refills | Status: DC | PRN

## 2018-12-03 NOTE — Progress Notes (Addendum)
BP 120/66   Pulse 62   Temp 98.1 F (36.7 C) (Oral)   Ht 5\' 9"  (1.753 m)   Wt 180 lb (81.6 kg)   SpO2 98%   BMI 26.58 kg/m    Subjective:    Patient ID: Paul Parker, male    DOB: Apr 01, 1941, 78 y.o.   MRN: [id number]  HPI: Paul Parker is a 78 y.o. male  Chief Complaint  Patient presents with  . Cough    pt states started last weekend/ tried OTC med  . Nasal Congestion    congested unable to sleep   Significant productive cough, congestion, rattling in chest, inability to sleep flat x 1 week. No fevers, chills, CP, body aches. Taking robitussin and tylenol which was helping initially. No hx of pulmonary dz or known hx of allergic rhinitis, but pt beginning to wonder if he may have some seasonal allergies as this is his 4th time being sick since September.   Relevant past medical, surgical, family and social history reviewed and updated as indicated. Interim medical history since our last visit reviewed. Allergies and medications reviewed and updated.  Review of Systems  Per HPI unless specifically indicated above     Objective:    BP 120/66   Pulse 62   Temp 98.1 F (36.7 C) (Oral)   Ht 5\' 9"  (1.753 m)   Wt 180 lb (81.6 kg)   SpO2 98%   BMI 26.58 kg/m   Wt Readings from Last 3 Encounters:  12/03/18 180 lb (81.6 kg)  11/04/18 171 lb 6.4 oz (77.7 kg)  09/30/18 168 lb 11.2 oz (76.5 kg)    Physical Exam Vitals signs and nursing note reviewed.  Constitutional:      Appearance: He is well-developed.  HENT:     Head: Atraumatic.     Right Ear: External ear normal.     Left Ear: External ear normal.     Nose: Congestion present.     Comments: Nasal mucosa erythematous    Mouth/Throat:     Pharynx: Posterior oropharyngeal erythema present. No oropharyngeal exudate.  Eyes:     Conjunctiva/sclera: Conjunctivae normal.     Pupils: Pupils are equal, round, and reactive to light.  Neck:     Musculoskeletal: Normal range of motion and neck supple.  Cardiovascular:   Rate and Rhythm: Normal rate and regular rhythm.  Pulmonary:     Effort: No respiratory distress.     Breath sounds: No wheezing or rales.     Comments: Breaths shallow due to poor effort, but lungs CTAB Musculoskeletal: Normal range of motion.  Lymphadenopathy:     Cervical: No cervical adenopathy.  Skin:    General: Skin is warm and dry.  Neurological:     Mental Status: He is alert and oriented to person, place, and time.  Psychiatric:        Behavior: Behavior normal.     Results for orders placed or performed in visit on 11/04/18  Microscopic Examination  Result Value Ref Range   WBC, UA None seen 0 - 5 /hpf   RBC, UA None seen 0 - 2 /hpf   Epithelial Cells (non renal) None seen 0 - 10 /hpf   Mucus, UA Present Not Estab.   Bacteria, UA None seen None seen/Few  Comprehensive metabolic panel  Result Value Ref Range   Glucose 152 (H) 65 - 99 mg/dL   BUN 21 8 - 27 mg/dL   Creatinine, Ser 0.94 0.76 - 1.27  mg/dL   GFR calc non Af Amer 78 >59 mL/min/1.73   GFR calc Af Amer 90 >59 mL/min/1.73   BUN/Creatinine Ratio 22 10 - 24   Sodium 140 134 - 144 mmol/L   Potassium 4.9 3.5 - 5.2 mmol/L   Chloride 101 96 - 106 mmol/L   CO2 25 20 - 29 mmol/L   Calcium 9.8 8.6 - 10.2 mg/dL   Total Protein 6.5 6.0 - 8.5 g/dL   Albumin 4.7 3.5 - 4.8 g/dL   Globulin, Total 1.8 1.5 - 4.5 g/dL   Albumin/Globulin Ratio 2.6 (H) 1.2 - 2.2   Bilirubin Total 0.4 0.0 - 1.2 mg/dL   Alkaline Phosphatase 46 39 - 117 IU/L   AST 19 0 - 40 IU/L   ALT 16 0 - 44 IU/L  Lipid panel  Result Value Ref Range   Cholesterol, Total 137 100 - 199 mg/dL   Triglycerides 69 0 - 149 mg/dL   HDL 62 >39 mg/dL   VLDL Cholesterol Cal 14 5 - 40 mg/dL   LDL Calculated 61 0 - 99 mg/dL   Chol/HDL Ratio 2.2 0.0 - 5.0 ratio  CBC with Differential/Platelet  Result Value Ref Range   WBC 6.8 3.4 - 10.8 x10E3/uL   RBC 4.02 (L) 4.14 - 5.80 x10E6/uL   Hemoglobin 11.7 (L) 13.0 - 17.7 g/dL   Hematocrit 36.5 (L) 37.5 - 51.0 %    MCV 91 79 - 97 fL   MCH 29.1 26.6 - 33.0 pg   MCHC 32.1 31.5 - 35.7 g/dL   RDW 13.7 12.3 - 15.4 %   Platelets 171 150 - 450 x10E3/uL   Neutrophils 61 Not Estab. %   Lymphs 28 Not Estab. %   Monocytes 10 Not Estab. %   Eos 1 Not Estab. %   Basos 0 Not Estab. %   Neutrophils Absolute 4.1 1.4 - 7.0 x10E3/uL   Lymphocytes Absolute 1.9 0.7 - 3.1 x10E3/uL   Monocytes Absolute 0.7 0.1 - 0.9 x10E3/uL   EOS (ABSOLUTE) 0.1 0.0 - 0.4 x10E3/uL   Basophils Absolute 0.0 0.0 - 0.2 x10E3/uL   Immature Granulocytes 0 Not Estab. %   Immature Grans (Abs) 0.0 0.0 - 0.1 x10E3/uL  TSH  Result Value Ref Range   TSH 2.070 0.450 - 4.500 uIU/mL  Urinalysis, Routine w reflex microscopic  Result Value Ref Range   Specific Gravity, UA 1.015 1.005 - 1.030   pH, UA 5.5 5.0 - 7.5   Color, UA Yellow Yellow   Appearance Ur Clear Clear   Leukocytes, UA Negative Negative   Protein, UA Negative Negative/Trace   Glucose, UA 3+ (A) Negative   Ketones, UA Negative Negative   RBC, UA Negative Negative   Bilirubin, UA Negative Negative   Urobilinogen, Ur 0.2 0.2 - 1.0 mg/dL   Nitrite, UA Negative Negative   Microscopic Examination See below:       Assessment & Plan:   Problem List Items Addressed This Visit    None    Visit Diagnoses    Upper respiratory tract infection, unspecified type    -  Primary   Tx with doxy, delsym and plain mucinex for daytime cough, promethazine DM for night. Zyrtec and flonase in case allergic component    Discussed with patient to make sure he was fully inflating his lungs with his breaths as he was taking shallow breaths on exam. Also discussed strong coughing rather than shallow coughing.    Follow up plan: Return if symptoms worsen  or fail to improve.

## 2018-12-08 DIAGNOSIS — E782 Mixed hyperlipidemia: Secondary | ICD-10-CM | POA: Diagnosis not present

## 2018-12-08 DIAGNOSIS — I739 Peripheral vascular disease, unspecified: Secondary | ICD-10-CM | POA: Diagnosis not present

## 2018-12-08 DIAGNOSIS — I4891 Unspecified atrial fibrillation: Secondary | ICD-10-CM | POA: Diagnosis not present

## 2018-12-08 DIAGNOSIS — I4892 Unspecified atrial flutter: Secondary | ICD-10-CM | POA: Diagnosis not present

## 2018-12-15 DIAGNOSIS — E782 Mixed hyperlipidemia: Secondary | ICD-10-CM | POA: Diagnosis not present

## 2018-12-15 DIAGNOSIS — I4891 Unspecified atrial fibrillation: Secondary | ICD-10-CM | POA: Diagnosis not present

## 2018-12-15 DIAGNOSIS — I739 Peripheral vascular disease, unspecified: Secondary | ICD-10-CM | POA: Diagnosis not present

## 2018-12-15 DIAGNOSIS — R0602 Shortness of breath: Secondary | ICD-10-CM | POA: Diagnosis not present

## 2018-12-15 DIAGNOSIS — I4892 Unspecified atrial flutter: Secondary | ICD-10-CM | POA: Diagnosis not present

## 2018-12-31 DIAGNOSIS — Z950 Presence of cardiac pacemaker: Secondary | ICD-10-CM | POA: Diagnosis not present

## 2018-12-31 DIAGNOSIS — I25701 Atherosclerosis of coronary artery bypass graft(s), unspecified, with angina pectoris with documented spasm: Secondary | ICD-10-CM | POA: Diagnosis not present

## 2018-12-31 DIAGNOSIS — E785 Hyperlipidemia, unspecified: Secondary | ICD-10-CM | POA: Diagnosis not present

## 2019-01-15 DIAGNOSIS — R601 Generalized edema: Secondary | ICD-10-CM | POA: Diagnosis not present

## 2019-01-15 DIAGNOSIS — Z8679 Personal history of other diseases of the circulatory system: Secondary | ICD-10-CM | POA: Diagnosis not present

## 2019-01-27 DIAGNOSIS — E119 Type 2 diabetes mellitus without complications: Secondary | ICD-10-CM | POA: Diagnosis not present

## 2019-03-03 ENCOUNTER — Other Ambulatory Visit: Payer: Medicare Other

## 2019-03-05 ENCOUNTER — Ambulatory Visit: Payer: Medicare Other | Admitting: Urology

## 2019-04-15 DIAGNOSIS — I4891 Unspecified atrial fibrillation: Secondary | ICD-10-CM | POA: Diagnosis not present

## 2019-04-15 DIAGNOSIS — I351 Nonrheumatic aortic (valve) insufficiency: Secondary | ICD-10-CM | POA: Diagnosis not present

## 2019-04-15 DIAGNOSIS — I4892 Unspecified atrial flutter: Secondary | ICD-10-CM | POA: Diagnosis not present

## 2019-04-15 DIAGNOSIS — R079 Chest pain, unspecified: Secondary | ICD-10-CM | POA: Diagnosis not present

## 2019-04-22 DIAGNOSIS — R079 Chest pain, unspecified: Secondary | ICD-10-CM | POA: Diagnosis not present

## 2019-04-26 ENCOUNTER — Other Ambulatory Visit: Payer: Medicare Other

## 2019-04-26 ENCOUNTER — Other Ambulatory Visit: Payer: Self-pay

## 2019-04-26 DIAGNOSIS — Z87898 Personal history of other specified conditions: Secondary | ICD-10-CM

## 2019-04-26 DIAGNOSIS — E782 Mixed hyperlipidemia: Secondary | ICD-10-CM | POA: Diagnosis not present

## 2019-04-26 DIAGNOSIS — I739 Peripheral vascular disease, unspecified: Secondary | ICD-10-CM | POA: Diagnosis not present

## 2019-04-26 DIAGNOSIS — I4891 Unspecified atrial fibrillation: Secondary | ICD-10-CM | POA: Diagnosis not present

## 2019-04-26 DIAGNOSIS — I4892 Unspecified atrial flutter: Secondary | ICD-10-CM | POA: Diagnosis not present

## 2019-04-27 LAB — PSA: Prostate Specific Ag, Serum: 1.9 ng/mL (ref 0.0–4.0)

## 2019-04-27 NOTE — Progress Notes (Signed)
04/28/2019 2:50 PM   Paul Parker 05-Jan-1941 [id number]  Referring provider: Guadalupe Maple, MD [address] [place], [place] [address]  Chief Complaint  Patient presents with  . Elevated PSA    HPI: Patient is a 78 year old Caucasian male with a history of an elevated PSA.       PSA trend  3.0 in October 2016  4.7 in November 2017  5.7 in December 2018  6.1 in March 2019  5.3 in May 2019  Started finasteride  3.8 in June 2019  1.4 (2.8) in September 2019  1.8 (3.6) in October 2019  1.9 (3.8) in June 2020  BPH WITH LUTS  (prostate and/or bladder) I PSS: 2/2     Previous IPSS score: 4/1   Previous PVR: 43 mL  Major complaint(s): None.  Denies any dysuria, hematuria or suprapubic pain.   Currently taking: finasteride 5 mg daily  Denies any recent fevers, chills, nausea or vomiting.  He does not have a family history of PCa.  IPSS    Row Name 04/28/19 1400         International Prostate Symptom Score   How often have you had the sensation of not emptying your bladder?  Less than 1 in 5     How often have you had to urinate less than every two hours?  Not at All     How often have you found you stopped and started again several times when you urinated?  Not at All     How often have you found it difficult to postpone urination?  Not at All     How often have you had a weak urinary stream?  Not at All     How often have you had to strain to start urination?  Not at All     How many times did you typically get up at night to urinate?  1 Time     Total IPSS Score  2       Quality of Life due to urinary symptoms   If you were to spend the rest of your life with your urinary condition just the way it is now how would you feel about that?  Mostly Satisfied        Score:  1-7 Mild 8-19 Moderate 20-35 Severe      PMH: Past Medical History:  Diagnosis Date  . CAD (coronary artery disease)   . Diabetes mellitus without complication ([place])   . Heart murmur    . Hyperlipidemia   . Hypertension     Surgical History: Past Surgical History:  Procedure Laterality Date  . CORONARY ARTERY BYPASS GRAFT    . CORONARY STENT PLACEMENT    . HERNIA REPAIR    . PACEMAKER PLACEMENT      Home Medications:  Allergies as of 04/28/2019      Reactions   Amoxicillin Diarrhea, Nausea And Vomiting      Medication List       Accurate as of April 28, 2019  2:50 PM. If you have any questions, ask your nurse or doctor.        STOP taking these medications   doxycycline 100 MG tablet Commonly known as:  VIBRA-TABS Stopped by:  Charlis Harner, PA-C   promethazine 6.25 MG/5ML syrup Commonly known as:  PHENERGAN Stopped by:  Jamael Hoffmann, PA-C     TAKE these medications   amiodarone 200 MG tablet Commonly known as:  PACERONE Take 100  mg by mouth daily.   amLODipine 5 MG tablet Commonly known as:  NORVASC Take 1 tablet (5 mg total) by mouth daily.   aspirin EC 81 MG tablet Take 81 mg by mouth daily.   atorvastatin 40 MG tablet Commonly known as:  LIPITOR Take 1 tablet (40 mg total) by mouth daily.   cetirizine 10 MG tablet Commonly known as:  ZYRTEC Take 1 tablet (10 mg total) by mouth daily.   clopidogrel 75 MG tablet Commonly known as:  PLAVIX Take 1 tablet (75 mg total) by mouth daily.   DULoxetine 30 MG capsule Commonly known as:  CYMBALTA Take 1 capsule (30 mg total) by mouth daily.   enalapril 20 MG tablet Commonly known as:  VASOTEC Take 1 tablet (20 mg total) by mouth daily.   ferrous sulfate 325 (65 FE) MG tablet Commonly known as:  FerrouSul Take 1 tablet (325 mg total) by mouth daily with breakfast.   finasteride 5 MG tablet Commonly known as:  Proscar Take 1 tablet (5 mg total) by mouth daily.   fluticasone 50 MCG/ACT nasal spray Commonly known as:  FLONASE Place 2 sprays into both nostrils 2 (two) times daily.   isosorbide mononitrate 30 MG 24 hr tablet Commonly known as:  IMDUR Take 30 mg by mouth daily.    levothyroxine 75 MCG tablet Commonly known as:  SYNTHROID Take 1 tablet (75 mcg total) by mouth daily before breakfast.   metFORMIN 500 MG tablet Commonly known as:  GLUCOPHAGE Take 2 tablets (1,000 mg total) by mouth 2 (two) times daily with a meal.   ONE TOUCH ULTRA TEST test strip Generic drug:  glucose blood USE TO TEST BLOOD SUGAR ONCE DAILY   OneTouch Delica Lancets 45Y Misc USE TO CHECK BLOOD SUGAR ONCE A DAY   pantoprazole 20 MG tablet Commonly known as:  PROTONIX Take 1 tablet (20 mg total) by mouth daily.   pioglitazone 45 MG tablet Commonly known as:  ACTOS Take 1 tablet (45 mg total) by mouth daily.       Allergies:  Allergies  Allergen Reactions  . Amoxicillin Diarrhea and Nausea And Vomiting    Family History: Family History  Problem Relation Age of Onset  . Heart disease Mother   . Heart disease Father     Social History:  reports that he has never smoked. He has never used smokeless tobacco. He reports that he does not drink alcohol or use drugs.  ROS: UROLOGY Frequent Urination?: No Hard to postpone urination?: No Burning/pain with urination?: No Get up at night to urinate?: No Leakage of urine?: No Urine stream starts and stops?: No Trouble starting stream?: No Do you have to strain to urinate?: No Blood in urine?: No Urinary tract infection?: No Sexually transmitted disease?: No Injury to kidneys or bladder?: No Painful intercourse?: No Weak stream?: No Erection problems?: No Penile pain?: No  Gastrointestinal Nausea?: No Vomiting?: No Indigestion/heartburn?: No Diarrhea?: No Constipation?: No  Constitutional Fever: No Night sweats?: No Weight loss?: No Fatigue?: No  Skin Skin rash/lesions?: No Itching?: No  Eyes Blurred vision?: No Double vision?: No  Ears/Nose/Throat Sore throat?: No Sinus problems?: No  Hematologic/Lymphatic Swollen glands?: No Easy bruising?: Yes  Cardiovascular Leg swelling?: No Chest  pain?: No  Respiratory Cough?: No Shortness of breath?: No  Endocrine Excessive thirst?: No  Musculoskeletal Back pain?: No Joint pain?: No  Neurological Headaches?: No Dizziness?: No  Psychologic Depression?: No Anxiety?: No  Physical Exam: BP 126/67 (BP Location:  Left Arm, Patient Position: Sitting, Cuff Size: Normal)   Pulse 72   Wt 172 lb 11.2 oz (78.3 kg)   BMI 25.50 kg/m   Constitutional:  Well nourished. Alert and oriented, No acute distress. HEENT: [place] AT, moist mucus membranes.  Trachea midline, no masses. Cardiovascular: No clubbing, cyanosis, or edema.  Pacemaker in place.   Respiratory: Normal respiratory effort, no increased work of breathing. GI: Abdomen is soft, non tender, non distended, no abdominal masses. Liver and spleen not palpable.  No hernias appreciated.  Stool sample for occult testing is not indicated.   GU: No CVA tenderness.  No bladder fullness or masses.  Patient with circumcised phallus.  Urethral meatus is patent.  No penile discharge. No penile lesions or rashes. Scrotum without lesions, cysts, rashes and/or edema.  Testicles are located scrotally bilaterally. No masses are appreciated in the testicles. Left and right epididymis are normal. Rectal: Patient with  normal sphincter tone. Anus and perineum without scarring or rashes. No rectal masses are appreciated. Prostate is approximately 50 grams, could only palpate the apex and midportion of the gland, no nodules are appreciated.  Skin: No rashes, bruises or suspicious lesions. Lymph: No inguinal adenopathy. Neurologic: Grossly intact, no focal deficits, moving all 4 extremities. Psychiatric: Normal mood and affect.  Laboratory Data: Lab Results  Component Value Date   WBC 6.8 11/04/2018   HGB 11.7 (L) 11/04/2018   HCT 36.5 (L) 11/04/2018   MCV 91 11/04/2018   PLT 171 11/04/2018    Lab Results  Component Value Date   CREATININE 0.94 11/04/2018    No results found for: PSA  No  results found for: TESTOSTERONE  Lab Results  Component Value Date   HGBA1C 6.7 08/13/2018    Lab Results  Component Value Date   TSH 2.070 11/04/2018       Component Value Date/Time   CHOL 137 11/04/2018 1100   CHOL 121 04/15/2017 1450   CHOL 112 01/02/2013 0605   HDL 62 11/04/2018 1100   HDL 49 01/02/2013 0605   CHOLHDL 2.2 11/04/2018 1100   VLDL 22 04/15/2017 1450   VLDL 21 01/02/2013 0605   LDLCALC 61 11/04/2018 1100   LDLCALC 42 01/02/2013 0605    Lab Results  Component Value Date   AST 19 11/04/2018   Lab Results  Component Value Date   ALT 16 11/04/2018   No components found for: ALKALINEPHOPHATASE No components found for: BILIRUBINTOTAL  No results found for: ESTRADIOL  I have reviewed the labs.   Assessment & Plan:    1. History of elevated PSA Current PSA 1.9 (3.8) Continue finasteride 5 mg daily  RTC in 6 months for PSA  2. BPH  No urinary symptoms at this time RTC in 6 months for I PSS, PSA and exam  Return in about 6 months (around 10/28/2019) for IPSS, PSA and exam.  These notes generated with voice recognition software. I apologize for typographical errors.  Zara Council, PA-C  [facility] [address] [place], [place] [address] [phone]

## 2019-04-28 ENCOUNTER — Encounter: Payer: Self-pay | Admitting: Urology

## 2019-04-28 ENCOUNTER — Other Ambulatory Visit: Payer: Self-pay

## 2019-04-28 ENCOUNTER — Ambulatory Visit: Payer: Medicare Other | Admitting: Urology

## 2019-04-28 VITALS — BP 126/67 | HR 72 | Wt 172.7 lb

## 2019-04-28 DIAGNOSIS — N400 Enlarged prostate without lower urinary tract symptoms: Secondary | ICD-10-CM | POA: Diagnosis not present

## 2019-04-28 DIAGNOSIS — Z87898 Personal history of other specified conditions: Secondary | ICD-10-CM

## 2019-05-20 ENCOUNTER — Ambulatory Visit: Payer: Medicare Other | Admitting: Family Medicine

## 2019-05-25 ENCOUNTER — Encounter: Payer: Self-pay | Admitting: Family Medicine

## 2019-05-25 ENCOUNTER — Ambulatory Visit (INDEPENDENT_AMBULATORY_CARE_PROVIDER_SITE_OTHER): Payer: Medicare Other | Admitting: Family Medicine

## 2019-05-25 ENCOUNTER — Other Ambulatory Visit: Payer: Self-pay

## 2019-05-25 DIAGNOSIS — D692 Other nonthrombocytopenic purpura: Secondary | ICD-10-CM | POA: Diagnosis not present

## 2019-05-25 DIAGNOSIS — E1169 Type 2 diabetes mellitus with other specified complication: Secondary | ICD-10-CM

## 2019-05-25 DIAGNOSIS — F339 Major depressive disorder, recurrent, unspecified: Secondary | ICD-10-CM

## 2019-05-25 DIAGNOSIS — E039 Hypothyroidism, unspecified: Secondary | ICD-10-CM

## 2019-05-25 DIAGNOSIS — I25111 Atherosclerotic heart disease of native coronary artery with angina pectoris with documented spasm: Secondary | ICD-10-CM

## 2019-05-25 DIAGNOSIS — I10 Essential (primary) hypertension: Secondary | ICD-10-CM | POA: Diagnosis not present

## 2019-05-25 NOTE — Assessment & Plan Note (Signed)
The current medical regimen is effective;  continue present plan and medications.  

## 2019-05-25 NOTE — Progress Notes (Signed)
   There were no vitals taken for this visit.   Subjective:    Patient ID: Paul Parker, male    DOB: December 26, 1940, 78 y.o.   MRN: [id number]  HPI: Paul Parker is a 78 y.o. male  Med check Discussed with patient and wife who assists with history doing well with medication no low blood sugar spells Or problems with diabetic medications. Saw Dr. Yancey Flemings previously with good report. Blood pressure cholesterol all stable. No complaints from medications and taking faithfully.  Relevant past medical, surgical, family and social history reviewed and updated as indicated. Interim medical history since our last visit reviewed. Allergies and medications reviewed and updated.  Review of Systems  Constitutional: Negative.   Respiratory: Negative.   Cardiovascular: Negative.     Per HPI unless specifically indicated above     Objective:    There were no vitals taken for this visit.  Wt Readings from Last 3 Encounters:  04/28/19 172 lb 11.2 oz (78.3 kg)  12/03/18 180 lb (81.6 kg)  11/04/18 171 lb 6.4 oz (77.7 kg)    Physical Exam  Results for orders placed or performed in visit on 04/26/19  PSA  Result Value Ref Range   Prostate Specific Ag, Serum 1.9 0.0 - 4.0 ng/mL      Assessment & Plan:   Problem List Items Addressed This Visit      Cardiovascular and Mediastinum   Hypertension    The current medical regimen is effective;  continue present plan and medications.       Senile purpura (HCC)    The current medical regimen is effective;  continue present plan and medications.       CAD (coronary artery disease)    The current medical regimen is effective;  continue present plan and medications.         Endocrine   Diabetes mellitus associated with hormonal etiology ([place])    The current medical regimen is effective;  continue present plan and medications.       Hypothyroidism    The current medical regimen is effective;  continue present plan and medications.         Other   Depression, recurrent ([place])    The current medical regimen is effective;  continue present plan and medications.           Telemedicine using audio/video telecommunications for a synchronous communication visit. Today's visit due to COVID-19 isolation precautions I connected with and verified that I am speaking with the correct person using two identifiers.   I discussed the limitations, risks, security and privacy concerns of performing an evaluation and management service by telecommunication and the availability of in person appointments. I also discussed with the patient that there may be a patient responsible charge related to this service. The patient expressed understanding and agreed to proceed. The patient's location is I am at home.   I discussed the assessment and treatment plan with the patient. The patient was provided an opportunity to ask questions and all were answered. The patient agreed with the plan and demonstrated an understanding of the instructions.   The patient was advised to call back or seek an in-person evaluation if the symptoms worsen or if the condition fails to improve as anticipated.   I provided 21+ minutes of time during this encounter. Follow up plan: Return in about 6 months (around 11/24/2019) for Physical Exam, Hemoglobin A1c.

## 2019-05-31 ENCOUNTER — Other Ambulatory Visit: Payer: Self-pay | Admitting: Family Medicine

## 2019-05-31 NOTE — Telephone Encounter (Signed)
Requested Prescriptions  Pending Prescriptions Disp Refills  . Ferrous Sulfate (IRON) 325 (65 Fe) MG TABS [Pharmacy Med Name: Iron 325 (65 Fe) MG Oral Tablet] 30 tablet 0    Sig: Take 1 tablet by mouth once daily with breakfast     Endocrinology:  Minerals - Iron Supplementation Failed - 05/31/2019 12:20 PM      Failed - HGB in normal range and within 360 days    Hemoglobin  Date Value Ref Range Status  11/04/2018 11.7 (L) 13.0 - 17.7 g/dL Final         Failed - HCT in normal range and within 360 days    Hematocrit  Date Value Ref Range Status  11/04/2018 36.5 (L) 37.5 - 51.0 % Final         Failed - RBC in normal range and within 360 days    RBC  Date Value Ref Range Status  11/04/2018 4.02 (L) 4.14 - 5.80 x10E6/uL Final  09/02/2018 3.86 (L) 4.22 - 5.81 MIL/uL Final         Failed - Ferritin in normal range and within 360 days    Ferritin  Date Value Ref Range Status  09/30/2018 23 (L) 30 - 400 ng/mL Final         Passed - Fe (serum) in normal range and within 360 days    Iron  Date Value Ref Range Status  09/30/2018 59 38 - 169 ug/dL Final   Iron Saturation  Date Value Ref Range Status  09/30/2018 15 15 - 55 % Final         Passed - Valid encounter within last 12 months    Recent Outpatient Visits          6 days ago Coronary artery disease involving native coronary artery of native heart with angina pectoris with documented spasm [facility])   [facility] [place], Jeannette How, MD   5 months ago Upper respiratory tract infection, unspecified type   [place], [place], PA-C   6 months ago Coronary artery disease involving native coronary artery of native heart with angina pectoris with documented spasm [facility])   [place] Crissman, Jeannette How, MD   8 months ago Anemia, unspecified type   [place], Jeannette How, MD   8 months ago Viral upper respiratory tract infection   [place],  [place], DO      Future Appointments            In 5 months [place], Gordan Payment [place]

## 2019-06-01 ENCOUNTER — Other Ambulatory Visit: Payer: Medicare Other

## 2019-06-01 ENCOUNTER — Other Ambulatory Visit: Payer: Self-pay

## 2019-06-01 DIAGNOSIS — E785 Hyperlipidemia, unspecified: Secondary | ICD-10-CM | POA: Diagnosis not present

## 2019-06-01 DIAGNOSIS — E1169 Type 2 diabetes mellitus with other specified complication: Secondary | ICD-10-CM

## 2019-06-01 DIAGNOSIS — I10 Essential (primary) hypertension: Secondary | ICD-10-CM | POA: Diagnosis not present

## 2019-06-01 LAB — LP+ALT+AST PICCOLO, WAIVED
ALT (SGPT) Piccolo, Waived: 23 U/L (ref 10–47)
AST (SGOT) Piccolo, Waived: 25 U/L (ref 11–38)
Chol/HDL Ratio Piccolo,Waive: 2 mg/dL
Cholesterol Piccolo, Waived: 126 mg/dL (ref <200)
HDL Chol Piccolo, Waived: 62 mg/dL (ref >59)
LDL Chol Calc Piccolo Waived: 45 mg/dL (ref <100)
Triglycerides Piccolo,Waived: 94 mg/dL (ref <150)
VLDL Chol Calc Piccolo,Waive: 19 mg/dL (ref <30)

## 2019-06-01 LAB — BAYER DCA HB A1C WAIVED: HB A1C (BAYER DCA - WAIVED): 7.2 % — ABNORMAL HIGH (ref <7.0)

## 2019-06-02 LAB — BASIC METABOLIC PANEL
BUN/Creatinine Ratio: 21 (ref 10–24)
BUN: 21 mg/dL (ref 8–27)
CO2: 21 mmol/L (ref 20–29)
Calcium: 9.7 mg/dL (ref 8.6–10.2)
Chloride: 99 mmol/L (ref 96–106)
Creatinine, Ser: 1.01 mg/dL (ref 0.76–1.27)
GFR calc Af Amer: 82 mL/min/{1.73_m2} (ref >59)
GFR calc non Af Amer: 71 mL/min/{1.73_m2} (ref >59)
Glucose: 224 mg/dL — ABNORMAL HIGH (ref 65–99)
Potassium: 4.9 mmol/L (ref 3.5–5.2)
Sodium: 139 mmol/L (ref 134–144)

## 2019-06-05 ENCOUNTER — Other Ambulatory Visit: Payer: Self-pay | Admitting: Family Medicine

## 2019-06-15 ENCOUNTER — Other Ambulatory Visit: Payer: Self-pay | Admitting: Family Medicine

## 2019-06-15 DIAGNOSIS — I25111 Atherosclerotic heart disease of native coronary artery with angina pectoris with documented spasm: Secondary | ICD-10-CM

## 2019-07-01 DIAGNOSIS — I4891 Unspecified atrial fibrillation: Secondary | ICD-10-CM | POA: Diagnosis not present

## 2019-07-01 DIAGNOSIS — M67919 Unspecified disorder of synovium and tendon, unspecified shoulder: Secondary | ICD-10-CM | POA: Diagnosis not present

## 2019-07-01 DIAGNOSIS — I25701 Atherosclerosis of coronary artery bypass graft(s), unspecified, with angina pectoris with documented spasm: Secondary | ICD-10-CM | POA: Diagnosis not present

## 2019-07-01 DIAGNOSIS — E785 Hyperlipidemia, unspecified: Secondary | ICD-10-CM | POA: Diagnosis not present

## 2019-07-05 ENCOUNTER — Other Ambulatory Visit: Payer: Self-pay | Admitting: Family Medicine

## 2019-07-05 NOTE — Telephone Encounter (Signed)
Requested Prescriptions  Pending Prescriptions Disp Refills  . Ferrous Sulfate (IRON) 325 (65 Fe) MG TABS [Pharmacy Med Name: Iron 325 (65 Fe) MG Oral Tablet] 90 tablet 1    Sig: Take 1 tablet by mouth once daily with breakfast     Endocrinology:  Minerals - Iron Supplementation Failed - 07/05/2019 12:26 PM      Failed - HGB in normal range and within 360 days    Hemoglobin  Date Value Ref Range Status  11/04/2018 11.7 (L) 13.0 - 17.7 g/dL Final         Failed - HCT in normal range and within 360 days    Hematocrit  Date Value Ref Range Status  11/04/2018 36.5 (L) 37.5 - 51.0 % Final         Failed - RBC in normal range and within 360 days    RBC  Date Value Ref Range Status  11/04/2018 4.02 (L) 4.14 - 5.80 x10E6/uL Final  09/02/2018 3.86 (L) 4.22 - 5.81 MIL/uL Final         Failed - Ferritin in normal range and within 360 days    Ferritin  Date Value Ref Range Status  09/30/2018 23 (L) 30 - 400 ng/mL Final         Passed - Fe (serum) in normal range and within 360 days    Iron  Date Value Ref Range Status  09/30/2018 59 38 - 169 ug/dL Final   Iron Saturation  Date Value Ref Range Status  09/30/2018 15 15 - 55 % Final         Passed - Valid encounter within last 12 months    Recent Outpatient Visits          1 month ago Coronary artery disease involving native coronary artery of native heart with angina pectoris with documented spasm ([place])   [facility] Crissman, Jeannette How, MD   7 months ago Upper respiratory tract infection, unspecified type   [place], [place], PA-C   8 months ago Coronary artery disease involving native coronary artery of native heart with angina pectoris with documented spasm [facility])   [place] Crissman, Jeannette How, MD   9 months ago Anemia, unspecified type   [facility] Crissman, Jeannette How, MD   9 months ago Viral upper respiratory tract infection   [place],  Barb Merino, DO      Future Appointments            In 3 months McGowan, Gordan Payment [place]   In 5 months [place] Desanctis, Lilia Argue, [place], [facility]

## 2019-07-06 DIAGNOSIS — I25701 Atherosclerosis of coronary artery bypass graft(s), unspecified, with angina pectoris with documented spasm: Secondary | ICD-10-CM | POA: Diagnosis not present

## 2019-07-06 DIAGNOSIS — I4891 Unspecified atrial fibrillation: Secondary | ICD-10-CM | POA: Diagnosis not present

## 2019-07-06 DIAGNOSIS — E785 Hyperlipidemia, unspecified: Secondary | ICD-10-CM | POA: Diagnosis not present

## 2019-08-07 ENCOUNTER — Emergency Department
Admission: EM | Admit: 2019-08-07 | Discharge: 2019-08-07 | Disposition: A | Discharge status: Home / Self Care | Payer: Medicare Other | Attending: Emergency Medicine | Admitting: Emergency Medicine

## 2019-08-07 ENCOUNTER — Emergency Department: Payer: Medicare Other

## 2019-08-07 ENCOUNTER — Encounter: Payer: Self-pay | Admitting: Intensive Care

## 2019-08-07 ENCOUNTER — Other Ambulatory Visit: Payer: Self-pay

## 2019-08-07 DIAGNOSIS — Z7982 Long term (current) use of aspirin: Secondary | ICD-10-CM | POA: Insufficient documentation

## 2019-08-07 DIAGNOSIS — E039 Hypothyroidism, unspecified: Secondary | ICD-10-CM | POA: Insufficient documentation

## 2019-08-07 DIAGNOSIS — Z955 Presence of coronary angioplasty implant and graft: Secondary | ICD-10-CM | POA: Insufficient documentation

## 2019-08-07 DIAGNOSIS — Z8546 Personal history of malignant neoplasm of prostate: Secondary | ICD-10-CM | POA: Diagnosis not present

## 2019-08-07 DIAGNOSIS — I10 Essential (primary) hypertension: Secondary | ICD-10-CM | POA: Diagnosis not present

## 2019-08-07 DIAGNOSIS — Z950 Presence of cardiac pacemaker: Secondary | ICD-10-CM | POA: Insufficient documentation

## 2019-08-07 DIAGNOSIS — R0789 Other chest pain: Secondary | ICD-10-CM | POA: Diagnosis not present

## 2019-08-07 DIAGNOSIS — E119 Type 2 diabetes mellitus without complications: Secondary | ICD-10-CM | POA: Insufficient documentation

## 2019-08-07 DIAGNOSIS — Z79899 Other long term (current) drug therapy: Secondary | ICD-10-CM | POA: Insufficient documentation

## 2019-08-07 DIAGNOSIS — Z7984 Long term (current) use of oral hypoglycemic drugs: Secondary | ICD-10-CM | POA: Insufficient documentation

## 2019-08-07 DIAGNOSIS — I2510 Atherosclerotic heart disease of native coronary artery without angina pectoris: Secondary | ICD-10-CM | POA: Insufficient documentation

## 2019-08-07 DIAGNOSIS — Z951 Presence of aortocoronary bypass graft: Secondary | ICD-10-CM | POA: Diagnosis not present

## 2019-08-07 HISTORY — DX: Malignant neoplasm of prostate: C61

## 2019-08-07 LAB — CBC
HCT: 35.1 % — ABNORMAL LOW (ref 39.0–52.0)
Hemoglobin: 11.2 g/dL — ABNORMAL LOW (ref 13.0–17.0)
MCH: 30.4 pg (ref 26.0–34.0)
MCHC: 31.9 g/dL (ref 30.0–36.0)
MCV: 95.1 fL (ref 80.0–100.0)
Platelets: 149 10*3/uL — ABNORMAL LOW (ref 150–400)
RBC: 3.69 MIL/uL — ABNORMAL LOW (ref 4.22–5.81)
RDW: 14.5 % (ref 11.5–15.5)
WBC: 8.4 10*3/uL (ref 4.0–10.5)
nRBC: 0 % (ref 0.0–0.2)

## 2019-08-07 LAB — BASIC METABOLIC PANEL
Anion gap: 13 (ref 5–15)
BUN: 18 mg/dL (ref 8–23)
CO2: 23 mmol/L (ref 22–32)
Calcium: 9 mg/dL (ref 8.9–10.3)
Chloride: 102 mmol/L (ref 98–111)
Creatinine, Ser: 1.06 mg/dL (ref 0.61–1.24)
GFR calc Af Amer: >60 mL/min (ref >60)
GFR calc non Af Amer: >60 mL/min (ref >60)
Glucose, Bld: 189 mg/dL — ABNORMAL HIGH (ref 70–99)
Potassium: 4.2 mmol/L (ref 3.5–5.1)
Sodium: 138 mmol/L (ref 135–145)

## 2019-08-07 LAB — TROPONIN I (HIGH SENSITIVITY)
Troponin I (High Sensitivity): 17 ng/L (ref <18)
Troponin I (High Sensitivity): 16 ng/L (ref <18)

## 2019-08-07 NOTE — ED Notes (Signed)
Pt from triage with chest pain - labs and ekg when drawn upon his arrival and resulted. Pt states he was having pain earlier that was relieved with nitro. Has been having "pressure" for two days. Denies pain and states "pressure" now.

## 2019-08-07 NOTE — ED Triage Notes (Addendum)
Patient c/o left sided chest heaviness since yesterday. Took one nitro today around 1:30pm with relief. Alert to time, self, situation, and place. Patient has intermittent confusion that wife states is his baseline.

## 2019-08-07 NOTE — Discharge Instructions (Addendum)

## 2019-08-07 NOTE — ED Provider Notes (Signed)
[facility] Emergency Department Provider Note   ____________________________________________   First MD Initiated Contact with Patient 08/07/19 1854     (approximate)  I have reviewed the triage vital signs and the nursing notes.   HISTORY  Chief Complaint Chest Pain    HPI Paul Parker is a 78 y.o. male history of coronary disease and diabetes  Patient reports that 20 years ago he had a coronary bypass, is also had a cardiac stent.  He has a pacemaker as well and he follows with Dr. Chancy Milroy of cardiology  Reports 2 episodes of chest pain.  Reports he had less than a couple seconds of discomfort located over the left side of his chest yesterday while he was resting and also 1 episode today while he was resting after lunch.  Denies exertional pain.  Reports that sort of hard to describe did not really feel like a pressure did not really feel super sharp just kind of a strange minutes.  Did not radiate.  No pain in the back.  He feels completely fine now and did not do anything to make the pain go away just last a couple seconds but I did occur it seemed pretty severe  Denies nausea vomiting no abdominal pain.  Has a appointment with his cardiologist Monday for his annual checkup  No COVID exposure.  Patient reports feels completely fine now    Past Medical History:  Diagnosis Date  . CAD (coronary artery disease)   . Diabetes mellitus without complication ([place])   . Heart murmur   . Hyperlipidemia   . Hypertension   . Prostate cancer [facility])     Patient Active Problem List   Diagnosis Date Noted  . Recurrent major depressive disorder, in full remission ([place]) 09/30/2018  . Anemia 09/30/2018  . Seborrheic keratosis 04/27/2018  . Advanced care planning/counseling discussion 10/28/2017  . Senile purpura ([place]) 08/30/2015  . CAD (coronary artery disease) 08/30/2015  . Concussion with coma 08/30/2015  . BPH (benign prostatic hyperplasia) 08/30/2015  .  Hypothyroidism 08/30/2015  . Depression, recurrent ([place]) 08/30/2015  . Diabetes mellitus associated with hormonal etiology ([place])   . Hyperlipidemia   . Hypertension     Past Surgical History:  Procedure Laterality Date  . CORONARY ARTERY BYPASS GRAFT    . CORONARY STENT PLACEMENT    . HERNIA REPAIR    . PACEMAKER PLACEMENT      Prior to Admission medications   Medication Sig Start Date End Date Taking? Authorizing Provider  amiodarone (PACERONE) 200 MG tablet Take 100 mg by mouth daily.     [provider]  amLODipine (NORVASC) 5 MG tablet Take 1 tablet (5 mg total) by mouth daily. 11/04/18   Guadalupe Maple, MD  aspirin EC 81 MG tablet Take 81 mg by mouth daily.    [provider]  atorvastatin (LIPITOR) 40 MG tablet Take 1 tablet (40 mg total) by mouth daily. 11/04/18   Guadalupe Maple, MD  cetirizine (ZYRTEC) 10 MG tablet Take 1 tablet (10 mg total) by mouth daily. 12/03/18   Volney American, PA-C  clopidogrel (PLAVIX) 75 MG tablet TAKE 1 TABLET BY MOUTH ONCE DAILY 06/16/19   Guadalupe Maple, MD  DULoxetine (CYMBALTA) 30 MG capsule Take 1 capsule (30 mg total) by mouth daily. 11/04/18   Guadalupe Maple, MD  enalapril (VASOTEC) 20 MG tablet Take 1 tablet (20 mg total) by mouth daily. 11/04/18   Guadalupe Maple, MD  Ferrous Sulfate (IRON) 325 (65 Fe) MG TABS Take 1 tablet by mouth once daily with breakfast 07/05/19   Guadalupe Maple, MD  finasteride (PROSCAR) 5 MG tablet Take 1 tablet (5 mg total) by mouth daily. 09/03/18   Zara Council A, PA-C  fluticasone (FLONASE) 50 MCG/ACT nasal spray Place 2 sprays into both nostrils 2 (two) times daily. 12/03/18   Volney American, PA-C  isosorbide mononitrate (IMDUR) 30 MG 24 hr tablet Take 30 mg by mouth daily.    [provider]  levothyroxine (SYNTHROID, LEVOTHROID) 75 MCG tablet Take 1 tablet (75 mcg total) by mouth daily before breakfast. 11/04/18   Guadalupe Maple, MD  metFORMIN (GLUCOPHAGE)  500 MG tablet Take 2 tablets (1,000 mg total) by mouth 2 (two) times daily with a meal. 11/04/18   Guadalupe Maple, MD  ONE TOUCH ULTRA TEST test strip USE TO TEST BLOOD SUGAR ONCE DAILY 04/09/18   Johnson, Megan P, DO  ONETOUCH DELICA LANCETS 99991111 MISC USE TO CHECK BLOOD SUGAR ONCE A DAY 03/20/16   Guadalupe Maple, MD  pantoprazole (PROTONIX) 20 MG tablet Take 1 tablet (20 mg total) by mouth daily. 11/04/18   Guadalupe Maple, MD  pioglitazone (ACTOS) 45 MG tablet Take 1 tablet (45 mg total) by mouth daily. 11/04/18   Guadalupe Maple, MD    Allergies Amoxicillin  Family History  Problem Relation Age of Onset  . Heart disease Mother   . Heart disease Father     Social History Social History   Tobacco Use  . Smoking status: Never Smoker  . Smokeless tobacco: Never Used  Substance Use Topics  . Alcohol use: No  . Drug use: No    Review of Systems Constitutional: No fever/chills Eyes: No visual changes. ENT: No sore throat. Cardiovascular: See HPI respiratory: Denies shortness of breath. Gastrointestinal: No abdominal pain.   Genitourinary: Negative for dysuria. Musculoskeletal: Negative for back pain. Skin: Negative for rash. Neurological: Negative for headaches, areas of focal weakness or numbness.    ____________________________________________   PHYSICAL EXAM:  VITAL SIGNS: ED Triage Vitals  Enc Vitals Group     BP 08/07/19 1406 117/71     Pulse Rate 08/07/19 1406 67     Resp 08/07/19 1406 16     Temp 08/07/19 1406 98.7 F (37.1 C)     Temp Source 08/07/19 1406 Oral     SpO2 08/07/19 1406 94 %     Weight 08/07/19 1434 168 lb (76.2 kg)     Height 08/07/19 1434 5\' 9"  (1.753 m)     Head Circumference --      Peak Flow --      Pain Score 08/07/19 1433 2     Pain Loc --      Pain Edu? --      Excl. in [place]? --     Constitutional: Alert and oriented. Well appearing and in no acute distress. Eyes: Conjunctivae are normal. Head: Atraumatic. Nose: No  congestion/rhinnorhea. Mouth/Throat: Mucous membranes are moist. Neck: No stridor.  Cardiovascular: Normal rate, regular rhythm. Grossly normal heart sounds.  Good peripheral circulation.  No reproducible chest tenderness.  Denies pain at present.  Pacemaker left upper chest no overlying lesions or hematoma. Respiratory: Normal respiratory effort.  No retractions. Lungs CTAB. Gastrointestinal: Soft and nontender. No distention. Musculoskeletal: No lower extremity tenderness nor edema. Neurologic:  Normal speech and language. No gross focal neurologic deficits are appreciated.  Skin:  Skin is warm, dry  and intact. No rash noted. Psychiatric: Mood and affect are normal. Speech and behavior are normal.  ____________________________________________   LABS (all labs ordered are listed, but only abnormal results are displayed)  Labs Reviewed  BASIC METABOLIC PANEL - Abnormal; Notable for the following components:      Result Value   Glucose, Bld 189 (*)    All other components within normal limits  CBC - Abnormal; Notable for the following components:   RBC 3.69 (*)    Hemoglobin 11.2 (*)    HCT 35.1 (*)    Platelets 149 (*)    All other components within normal limits  TROPONIN I (HIGH SENSITIVITY)  TROPONIN I (HIGH SENSITIVITY)   ____________________________________________  EKG  Reviewed at 1420 Heart rate 60 QRS 169 QTc 530 Ventricular paced, no obvious ischemia ____________________________________________  RADIOLOGY  Dg Chest 2 View  Result Date: 08/07/2019 CLINICAL DATA:  Patient with left-sided chest heaviness. EXAM: CHEST - 2 VIEW COMPARISON:  Chest radiograph 03/17/2017 FINDINGS: Multi lead pacer apparatus overlies the left hemithorax. Stable cardiomegaly status post median sternotomy. No large area pulmonary consolidation. No pleural effusion or pneumothorax. Thoracic spine degenerative changes. IMPRESSION: No acute cardiopulmonary process. Electronically Signed   By:  Lovey Newcomer M.D.   On: 08/07/2019 15:21    ____________________________________________   PROCEDURES  Procedure(s) performed: None  Procedures  Critical Care performed: No ____________________________________________   INITIAL IMPRESSION / ASSESSMENT AND PLAN / ED COURSE  Pertinent labs & imaging results that were available during my care of the patient were reviewed by me and considered in my medical decision making (see chart for details).   Differential diagnosis includes, but is not limited to, ACS, aortic dissection, pulmonary embolism, cardiac tamponade, pneumothorax, pneumonia, pericarditis, myocarditis, GI-related causes including esophagitis/gastritis, and musculoskeletal chest wall pain.  Patient presents for evaluation of brief episode of chest discomfort.  Currently asymptomatic.  Paced rhythm, initial troponin less than 18.  He is currently asymptomatic, symptoms atypical he reports of mid back discomfort that lasted only a couple seconds.  No radiating pain, no tearing pain, no movement of pain.  Reassuring imaging and initial work-up.  Also seen and has a history of coronary disease with cardiology, but he denies any chest pressure, shortness of breath, chest heaviness or any ongoing symptoms.  Last occurred just after lunch today.  No signs or symptoms suggest dissection, pulmonary embolism, DVT, pneumothorax, infectious etiology or obvious ACS denoted   HEAR Score: 5       Patient remains pain-free, second troponin no elevation.  Atypical symptoms, has an appointment with his cardiologist Monday.  Discussed with the patient and his wife, they are comfortable with the plan for discharge he will continue his current cardiac medications and return to the ER if he has return of symptoms, chest pain shortness of breath weakness or other new concerns arise  Return precautions and treatment recommendations and follow-up discussed with the patient who is agreeable with the  plan.  Paul Parker was evaluated in Emergency Department on 08/08/2019 for the symptoms described in the history of present illness. He was evaluated in the context of the global COVID-19 pandemic, which necessitated consideration that the patient might be at risk for infection with the SARS-CoV-2 virus that causes COVID-19. Institutional protocols and algorithms that pertain to the evaluation of patients at risk for COVID-19 are in a state of rapid change based on information released by regulatory bodies including the CDC and federal and state organizations. These policies and  algorithms were followed during the patient's care in the ED.  No obvious COVID symptoms or risk factors  ____________________________________________   FINAL CLINICAL IMPRESSION(S) / ED DIAGNOSES  Final diagnoses:  Chest pain, atypical        Note:  This document was prepared using Dragon voice recognition software and may include unintentional dictation errors       Delman Kitten, MD 08/08/19 0101

## 2019-08-09 ENCOUNTER — Encounter: Payer: Self-pay | Admitting: Family Medicine

## 2019-08-09 DIAGNOSIS — E782 Mixed hyperlipidemia: Secondary | ICD-10-CM | POA: Diagnosis not present

## 2019-08-09 DIAGNOSIS — I2510 Atherosclerotic heart disease of native coronary artery without angina pectoris: Secondary | ICD-10-CM | POA: Diagnosis not present

## 2019-08-09 DIAGNOSIS — I739 Peripheral vascular disease, unspecified: Secondary | ICD-10-CM | POA: Diagnosis not present

## 2019-08-09 DIAGNOSIS — R079 Chest pain, unspecified: Secondary | ICD-10-CM | POA: Diagnosis not present

## 2019-08-10 DIAGNOSIS — I739 Peripheral vascular disease, unspecified: Secondary | ICD-10-CM | POA: Diagnosis not present

## 2019-08-10 DIAGNOSIS — I2510 Atherosclerotic heart disease of native coronary artery without angina pectoris: Secondary | ICD-10-CM | POA: Diagnosis not present

## 2019-08-10 DIAGNOSIS — R079 Chest pain, unspecified: Secondary | ICD-10-CM | POA: Diagnosis not present

## 2019-08-10 DIAGNOSIS — E782 Mixed hyperlipidemia: Secondary | ICD-10-CM | POA: Diagnosis not present

## 2019-08-23 ENCOUNTER — Other Ambulatory Visit: Payer: Self-pay | Admitting: Urology

## 2019-09-08 ENCOUNTER — Other Ambulatory Visit: Payer: Self-pay

## 2019-09-08 ENCOUNTER — Ambulatory Visit (INDEPENDENT_AMBULATORY_CARE_PROVIDER_SITE_OTHER): Payer: Medicare Other

## 2019-09-08 DIAGNOSIS — Z23 Encounter for immunization: Secondary | ICD-10-CM | POA: Diagnosis not present

## 2019-09-27 ENCOUNTER — Other Ambulatory Visit: Payer: Self-pay | Admitting: Family Medicine

## 2019-09-28 ENCOUNTER — Other Ambulatory Visit: Payer: Self-pay

## 2019-09-28 MED ORDER — ONETOUCH ULTRA VI STRP: 1.0000 | ORAL_STRIP | Freq: Every day | 0 refills | Status: DC | PRN

## 2019-09-28 NOTE — Telephone Encounter (Signed)
Pharmacy sent a fax requesting that the RX for the test strips be resent with the DX code. E11.69 is the code on problem list.

## 2019-09-30 DIAGNOSIS — I4891 Unspecified atrial fibrillation: Secondary | ICD-10-CM | POA: Diagnosis not present

## 2019-09-30 DIAGNOSIS — E785 Hyperlipidemia, unspecified: Secondary | ICD-10-CM | POA: Diagnosis not present

## 2019-09-30 DIAGNOSIS — Z950 Presence of cardiac pacemaker: Secondary | ICD-10-CM | POA: Diagnosis not present

## 2019-09-30 DIAGNOSIS — I25701 Atherosclerosis of coronary artery bypass graft(s), unspecified, with angina pectoris with documented spasm: Secondary | ICD-10-CM | POA: Diagnosis not present

## 2019-09-30 DIAGNOSIS — I499 Cardiac arrhythmia, unspecified: Secondary | ICD-10-CM | POA: Diagnosis not present

## 2019-10-11 DIAGNOSIS — I4892 Unspecified atrial flutter: Secondary | ICD-10-CM | POA: Diagnosis not present

## 2019-10-11 DIAGNOSIS — E782 Mixed hyperlipidemia: Secondary | ICD-10-CM | POA: Diagnosis not present

## 2019-10-11 DIAGNOSIS — I4891 Unspecified atrial fibrillation: Secondary | ICD-10-CM | POA: Diagnosis not present

## 2019-10-11 DIAGNOSIS — I739 Peripheral vascular disease, unspecified: Secondary | ICD-10-CM | POA: Diagnosis not present

## 2019-10-25 ENCOUNTER — Other Ambulatory Visit: Payer: Self-pay

## 2019-10-25 DIAGNOSIS — Z87898 Personal history of other specified conditions: Secondary | ICD-10-CM

## 2019-10-26 ENCOUNTER — Other Ambulatory Visit: Payer: Self-pay

## 2019-10-26 ENCOUNTER — Other Ambulatory Visit: Payer: Medicare Other

## 2019-10-26 DIAGNOSIS — Z87898 Personal history of other specified conditions: Secondary | ICD-10-CM

## 2019-10-27 LAB — PSA: Prostate Specific Ag, Serum: 1.4 ng/mL (ref 0.0–4.0)

## 2019-10-27 NOTE — Progress Notes (Signed)
10/28/2019 11:04 AM   Paul Parker 1941-01-08 [id number]  Referring provider: Guadalupe Maple, MD [address] [place],  [place] [address]  Chief Complaint  Patient presents with  . Elevated PSA    17months    HPI: Patient is a 78 year old male with a history of an elevated PSA.       PSA trend  3.0 in October 2016  4.7 in November 2017  5.7 in December 2018  6.1 in March 2019  5.3 in May 2019  Started finasteride  3.8 in June 2019  1.4 (2.8) in September 2019  1.8 (3.6) in October 2019  1.9 (3.8) in June 2020  1.4 (2.8) in December 2020  BPH WITH LUTS  (prostate and/or bladder) I PSS: 12/2     Previous IPSS score: 2/2     Previous PVR: 43 mL  Major complaint(s): None.  Denies any dysuria, hematuria or suprapubic pain.   Currently taking: finasteride 5 mg daily  Denies any recent fevers, chills, nausea or vomiting.  He does not have a family history of PCa.  IPSS    Row Name 10/28/19 1000         International Prostate Symptom Score   How often have you had the sensation of not emptying your bladder?  Less than half the time     How often have you had to urinate less than every two hours?  About half the time     How often have you found you stopped and started again several times when you urinated?  Less than half the time     How often have you found it difficult to postpone urination?  Less than 1 in 5 times     How often have you had a weak urinary stream?  Less than half the time     How often have you had to strain to start urination?  Less than 1 in 5 times     How many times did you typically get up at night to urinate?  1 Time     Total IPSS Score  12       Quality of Life due to urinary symptoms   If you were to spend the rest of your life with your urinary condition just the way it is now how would you feel about that?  Mostly Satisfied        Score:  1-7 Mild 8-19 Moderate 20-35 Severe      PMH: Past Medical History:  Diagnosis Date   . CAD (coronary artery disease)   . Diabetes mellitus without complication ([place])   . Heart murmur   . Hyperlipidemia   . Hypertension   . Prostate cancer [facility])     Surgical History: Past Surgical History:  Procedure Laterality Date  . CORONARY ARTERY BYPASS GRAFT    . CORONARY STENT PLACEMENT    . HERNIA REPAIR    . PACEMAKER PLACEMENT      Home Medications:  Allergies as of 10/28/2019      Reactions   Amoxicillin Diarrhea, Nausea And Vomiting      Medication List       Accurate as of October 28, 2019 11:04 AM. If you have any questions, ask your nurse or doctor.        amiodarone 200 MG tablet Commonly known as: PACERONE Take 100 mg by mouth daily.   amLODipine 5 MG tablet Commonly known as: NORVASC Take 1 tablet (5 mg total)  by mouth daily.   aspirin EC 81 MG tablet Take 81 mg by mouth daily.   atorvastatin 40 MG tablet Commonly known as: LIPITOR Take 1 tablet (40 mg total) by mouth daily.   cetirizine 10 MG tablet Commonly known as: ZYRTEC Take 1 tablet (10 mg total) by mouth daily.   clopidogrel 75 MG tablet Commonly known as: PLAVIX TAKE 1 TABLET BY MOUTH ONCE DAILY   DULoxetine 30 MG capsule Commonly known as: CYMBALTA Take 1 capsule (30 mg total) by mouth daily.   enalapril 20 MG tablet Commonly known as: VASOTEC Take 1 tablet (20 mg total) by mouth daily.   finasteride 5 MG tablet Commonly known as: PROSCAR Take 1 tablet (5 mg total) by mouth daily.   fluticasone 50 MCG/ACT nasal spray Commonly known as: FLONASE Place 2 sprays into both nostrils 2 (two) times daily.   Iron 325 (65 Fe) MG Tabs Take 1 tablet by mouth once daily with breakfast   isosorbide mononitrate 30 MG 24 hr tablet Commonly known as: IMDUR Take 30 mg by mouth daily.   levothyroxine 75 MCG tablet Commonly known as: SYNTHROID Take 1 tablet (75 mcg total) by mouth daily before breakfast.   metFORMIN 500 MG tablet Commonly known as: GLUCOPHAGE Take 2 tablets  (1,000 mg total) by mouth 2 (two) times daily with a meal.   OneTouch Delica Lancets 99991111 Misc USE TO CHECK BLOOD SUGAR ONCE A DAY   OneTouch Ultra test strip Generic drug: glucose blood 1 each by Other route daily as needed for other. Use as instructed   pantoprazole 20 MG tablet Commonly known as: PROTONIX Take 1 tablet (20 mg total) by mouth daily.   pioglitazone 45 MG tablet Commonly known as: ACTOS Take 1 tablet (45 mg total) by mouth daily.       Allergies:  Allergies  Allergen Reactions  . Amoxicillin Diarrhea and Nausea And Vomiting    Family History: Family History  Problem Relation Age of Onset  . Heart disease Mother   . Heart disease Father     Social History:  reports that he has never smoked. He has never used smokeless tobacco. He reports that he does not drink alcohol or use drugs.  ROS: UROLOGY Frequent Urination?: No Hard to postpone urination?: No Burning/pain with urination?: No Get up at night to urinate?: No Leakage of urine?: No Urine stream starts and stops?: No Trouble starting stream?: No Do you have to strain to urinate?: No Blood in urine?: No Urinary tract infection?: No Sexually transmitted disease?: No Injury to kidneys or bladder?: No Painful intercourse?: No Weak stream?: No Erection problems?: No Penile pain?: No  Gastrointestinal Nausea?: No Vomiting?: No Indigestion/heartburn?: No Diarrhea?: No Constipation?: No  Constitutional Fever: No Night sweats?: No Weight loss?: No Fatigue?: No  Skin Skin rash/lesions?: No Itching?: No  Eyes Blurred vision?: No Double vision?: No  Ears/Nose/Throat Sore throat?: No Sinus problems?: No  Hematologic/Lymphatic Swollen glands?: No Easy bruising?: No  Cardiovascular Leg swelling?: No Chest pain?: No  Respiratory Cough?: No Shortness of breath?: No  Endocrine Excessive thirst?: No  Musculoskeletal Back pain?: No Joint pain?:  No  Neurological Headaches?: No Dizziness?: No  Psychologic Depression?: No Anxiety?: No  Physical Exam: BP 114/64   Pulse 86   Ht 5\' 8"  (1.727 m)   Wt 174 lb (78.9 kg)   BMI 26.46 kg/m   Constitutional:  Well nourished. Alert and oriented, No acute distress. HEENT: [place] AT, moist mucus membranes.  Trachea midline, no masses. Cardiovascular: No clubbing, cyanosis, or edema. Respiratory: Normal respiratory effort, no increased work of breathing. Neurologic: Grossly intact, no focal deficits, moving all 4 extremities. Psychiatric: Normal mood and affect.  Laboratory Data: Lab Results  Component Value Date   WBC 8.4 08/07/2019   HGB 11.2 (L) 08/07/2019   HCT 35.1 (L) 08/07/2019   MCV 95.1 08/07/2019   PLT 149 (L) 08/07/2019    Lab Results  Component Value Date   CREATININE 1.06 08/07/2019    No results found for: PSA  No results found for: TESTOSTERONE  Lab Results  Component Value Date   HGBA1C 7.2 (H) 06/01/2019    Lab Results  Component Value Date   TSH 2.070 11/04/2018       Component Value Date/Time   CHOL 126 06/01/2019 1031   CHOL 112 01/02/2013 0605   HDL 62 11/04/2018 1100   HDL 49 01/02/2013 0605   CHOLHDL 2.2 11/04/2018 1100   VLDL 19 06/01/2019 1031   VLDL 21 01/02/2013 0605   LDLCALC 61 11/04/2018 1100   LDLCALC 42 01/02/2013 0605    Lab Results  Component Value Date   AST 25 06/01/2019   Lab Results  Component Value Date   ALT 23 06/01/2019   No components found for: ALKALINEPHOPHATASE No components found for: BILIRUBINTOTAL  No results found for: ESTRADIOL  I have reviewed the labs.   Assessment & Plan:    1. History of elevated PSA Current PSA 1.4 (2.8) Continue finasteride 5 mg daily - refill sent to [facility] on [address]  RTC in 12 months for PSA  2. BPH with LUTS IPSS score is 12/2, it is worsening Continue conservative management, avoiding bladder irritants and timed voiding's Continue finasteride 5 mg daily -  refills given RTC in 12 months for IPSS, PSA and exam   Return in about 1 year (around 10/27/2020) for IPSS, PSA and exam.  These notes generated with voice recognition software. I apologize for typographical errors.  Zara Council, PA-C  [facility] [address] [place], [place] [address] [phone]

## 2019-10-28 ENCOUNTER — Ambulatory Visit (INDEPENDENT_AMBULATORY_CARE_PROVIDER_SITE_OTHER): Payer: Medicare Other | Admitting: Urology

## 2019-10-28 ENCOUNTER — Other Ambulatory Visit: Payer: Self-pay

## 2019-10-28 ENCOUNTER — Encounter: Payer: Self-pay | Admitting: Urology

## 2019-10-28 VITALS — BP 114/64 | HR 86 | Ht 68.0 in | Wt 174.0 lb

## 2019-10-28 DIAGNOSIS — N400 Enlarged prostate without lower urinary tract symptoms: Secondary | ICD-10-CM | POA: Diagnosis not present

## 2019-10-28 DIAGNOSIS — Z87898 Personal history of other specified conditions: Secondary | ICD-10-CM | POA: Diagnosis not present

## 2019-10-28 MED ORDER — FINASTERIDE 5 MG PO TABS: 5.0000 mg | ORAL_TABLET | Freq: Every day | ORAL | 0 refills | Status: DC

## 2019-11-23 ENCOUNTER — Encounter: Payer: Self-pay | Admitting: Family Medicine

## 2019-11-29 DIAGNOSIS — I739 Peripheral vascular disease, unspecified: Secondary | ICD-10-CM | POA: Diagnosis not present

## 2019-11-29 DIAGNOSIS — I4891 Unspecified atrial fibrillation: Secondary | ICD-10-CM | POA: Diagnosis not present

## 2019-11-29 DIAGNOSIS — I4892 Unspecified atrial flutter: Secondary | ICD-10-CM | POA: Diagnosis not present

## 2019-11-29 DIAGNOSIS — E782 Mixed hyperlipidemia: Secondary | ICD-10-CM | POA: Diagnosis not present

## 2019-12-02 ENCOUNTER — Ambulatory Visit (INDEPENDENT_AMBULATORY_CARE_PROVIDER_SITE_OTHER): Payer: Medicare Other | Admitting: Family Medicine

## 2019-12-02 ENCOUNTER — Encounter: Payer: Self-pay | Admitting: Family Medicine

## 2019-12-02 ENCOUNTER — Other Ambulatory Visit: Payer: Self-pay

## 2019-12-02 VITALS — BP 120/65 | HR 63 | Temp 98.3°F | Ht 67.8 in | Wt 177.0 lb

## 2019-12-02 DIAGNOSIS — F339 Major depressive disorder, recurrent, unspecified: Secondary | ICD-10-CM

## 2019-12-02 DIAGNOSIS — N400 Enlarged prostate without lower urinary tract symptoms: Secondary | ICD-10-CM

## 2019-12-02 DIAGNOSIS — E119 Type 2 diabetes mellitus without complications: Secondary | ICD-10-CM

## 2019-12-02 DIAGNOSIS — I10 Essential (primary) hypertension: Secondary | ICD-10-CM

## 2019-12-02 DIAGNOSIS — Z0000 Encounter for general adult medical examination without abnormal findings: Secondary | ICD-10-CM

## 2019-12-02 DIAGNOSIS — I25111 Atherosclerotic heart disease of native coronary artery with angina pectoris with documented spasm: Secondary | ICD-10-CM | POA: Diagnosis not present

## 2019-12-02 DIAGNOSIS — E785 Hyperlipidemia, unspecified: Secondary | ICD-10-CM

## 2019-12-02 DIAGNOSIS — E039 Hypothyroidism, unspecified: Secondary | ICD-10-CM | POA: Diagnosis not present

## 2019-12-02 DIAGNOSIS — E1169 Type 2 diabetes mellitus with other specified complication: Secondary | ICD-10-CM

## 2019-12-02 DIAGNOSIS — F3342 Major depressive disorder, recurrent, in full remission: Secondary | ICD-10-CM

## 2019-12-02 DIAGNOSIS — R413 Other amnesia: Secondary | ICD-10-CM

## 2019-12-02 LAB — UA/M W/RFLX CULTURE, ROUTINE
Bilirubin, UA: NEGATIVE
Ketones, UA: NEGATIVE
Leukocytes,UA: NEGATIVE
Nitrite, UA: NEGATIVE
Protein,UA: NEGATIVE
RBC, UA: NEGATIVE
Specific Gravity, UA: 1.015 (ref 1.005–1.030)
Urobilinogen, Ur: 0.2 mg/dL (ref 0.2–1.0)
pH, UA: 5.5 (ref 5.0–7.5)

## 2019-12-02 NOTE — Progress Notes (Signed)
BP 120/65   Pulse 63   Temp 98.3 F (36.8 C) (Oral)   Ht 5' 7.8" (1.722 m)   Wt 177 lb (80.3 kg)   SpO2 97%   BMI 27.07 kg/m    Subjective:    Patient ID: Paul Parker, male    DOB: 29-Nov-1940, 79 y.o.   MRN: [id number]  HPI: Paul Parker is a 79 y.o. male presenting on 12/02/2019 for comprehensive medical examination. Current medical complaints include:see below  Seeing Dr. Chancy Milroy for cardiac issues, including HTN, CAD, and arrhythmia. Last appt was Monday. Sees them every few months. Fairly stable on current regimen, but wife states he has his ups and downs symptomatically. Home BPs running 120s/70s typically.   HLD - taking lipitor, tolerating well. Denies claudication,myalgias.   DM - home BS readings have been labile, 120-180 depending on what he eats. Has indulged a bit for the holidays as far as diet. Denies low blood sugar spells. Does not exercise.   Moods fairly well controlled on cymbalta regimen. Denies SI/HI, mood swings, sleep or appetite concerns.   Seeing Urology for BPH, currently on proscar which he states does help with his sxs.   Hypothyroid - taking synthroid daily without issue.   He currently lives with: Interim Problems from his last visit: no  Depression Screen done today and results listed below:  Depression screen [facility] 2/9 12/02/2019 08/13/2018 04/27/2018 12/31/2017 10/28/2017  Decreased Interest 0 0 0 0 0  Down, Depressed, Hopeless 0 0 0 0 0  PHQ - 2 Score 0 0 0 0 0  Altered sleeping 1 1 - - -  Tired, decreased energy 0 0 - - -  Change in appetite 0 0 - - -  Feeling bad or failure about yourself  0 0 - - -  Trouble concentrating 0 0 - - -  Moving slowly or fidgety/restless 1 0 - - -  Suicidal thoughts 0 0 - - -  PHQ-9 Score 2 1 - - -  Difficult doing work/chores - Not difficult at all - - -    The patient does not have a history of falls. I did complete a risk assessment for falls. A plan of care for falls was documented.   Past Medical History:  Past  Medical History:  Diagnosis Date  . CAD (coronary artery disease)   . Diabetes mellitus without complication ([place])   . Heart murmur   . Hyperlipidemia   . Hypertension   . Prostate cancer [facility])     Surgical History:  Past Surgical History:  Procedure Laterality Date  . CORONARY ARTERY BYPASS GRAFT    . CORONARY STENT PLACEMENT    . HERNIA REPAIR    . PACEMAKER PLACEMENT      Medications:  Current Outpatient Medications on File Prior to Visit  Medication Sig  . amiodarone (PACERONE) 200 MG tablet Take 100 mg by mouth daily.   Marland Kitchen aspirin EC 81 MG tablet Take 81 mg by mouth daily.  . clopidogrel (PLAVIX) 75 MG tablet TAKE 1 TABLET BY MOUTH ONCE DAILY  . Docusate Calcium (STOOL SOFTENER PO) Take by mouth daily.  . Ferrous Sulfate (IRON PO) Take by mouth daily. 65 mg  . Ferrous Sulfate (IRON) 325 (65 Fe) MG TABS Take 1 tablet by mouth once daily with breakfast  . finasteride (PROSCAR) 5 MG tablet Take 1 tablet (5 mg total) by mouth daily.  . furosemide (LASIX) 20 MG tablet Take 20 mg by mouth daily.  Marland Kitchen  glucose blood (ONETOUCH ULTRA) test strip 1 each by Other route daily as needed for other. Use as instructed  . isosorbide mononitrate (IMDUR) 60 MG 24 hr tablet Take 60 mg by mouth daily.  Glory Rosebush DELICA LANCETS [id number] MISC USE TO CHECK BLOOD SUGAR ONCE A DAY  . fluticasone (FLONASE) 50 MCG/ACT nasal spray Place 2 sprays into both nostrils 2 (two) times daily. (Patient not taking: Reported on 12/02/2019)   No current facility-administered medications on file prior to visit.    Allergies:  Allergies  Allergen Reactions  . Amoxicillin Diarrhea and Nausea And Vomiting    Social History:  Social History   Socioeconomic History  . Marital status: Married    Spouse name: Not on file  . Number of children: Not on file  . Years of education: Not on file  . Highest education level: Not on file  Occupational History  . Not on file  Tobacco Use  . Smoking status: Never Smoker  .  Smokeless tobacco: Never Used  Substance and Sexual Activity  . Alcohol use: No  . Drug use: No  . Sexual activity: Not on file  Other Topics Concern  . Not on file  Social History Narrative  . Not on file   Social Determinants of Health   Financial Resource Strain:   . Difficulty of Paying Living Expenses: Not on file  Food Insecurity:   . Worried About Charity fundraiser in the Last Year: Not on file  . Ran Out of Food in the Last Year: Not on file  Transportation Needs:   . Lack of Transportation (Medical): Not on file  . Lack of Transportation (Non-Medical): Not on file  Physical Activity:   . Days of Exercise per Week: Not on file  . Minutes of Exercise per Session: Not on file  Stress:   . Feeling of Stress : Not on file  Social Connections:   . Frequency of Communication with Friends and Family: Not on file  . Frequency of Social Gatherings with Friends and Family: Not on file  . Attends Religious Services: Not on file  . Active Member of Clubs or Organizations: Not on file  . Attends Archivist Meetings: Not on file  . Marital Status: Not on file  Intimate Partner Violence:   . Fear of Current or Ex-Partner: Not on file  . Emotionally Abused: Not on file  . Physically Abused: Not on file  . Sexually Abused: Not on file   Social History   Tobacco Use  Smoking Status Never Smoker  Smokeless Tobacco Never Used   Social History   Substance and Sexual Activity  Alcohol Use No    Family History:  Family History  Problem Relation Age of Onset  . Heart disease Mother   . Heart disease Father     Past medical history, surgical history, medications, allergies, family history and social history reviewed with patient today and changes made to appropriate areas of the chart.   Review of Systems - General ROS: negative Psychological ROS: negative Ophthalmic ROS: negative ENT ROS: negative Allergy and Immunology ROS: negative Hematological and  Lymphatic ROS: negative Endocrine ROS: negative Breast ROS: negative for breast lumps Respiratory ROS: no cough, shortness of breath, or wheezing Cardiovascular ROS: no chest pain or dyspnea on exertion Gastrointestinal ROS: no abdominal pain, change in bowel habits, or black or bloody stools Genito-Urinary ROS: no dysuria, trouble voiding, or hematuria Musculoskeletal ROS: negative Neurological ROS: positive for -  memory loss Dermatological ROS: negative All other ROS negative except what is listed above and in the HPI.      Objective:    BP 120/65   Pulse 63   Temp 98.3 F (36.8 C) (Oral)   Ht 5' 7.8" (1.722 m)   Wt 177 lb (80.3 kg)   SpO2 97%   BMI 27.07 kg/m   Wt Readings from Last 3 Encounters:  12/02/19 177 lb (80.3 kg)  10/28/19 174 lb (78.9 kg)  08/07/19 168 lb (76.2 kg)    Physical Exam Vitals and nursing note reviewed.  Constitutional:      General: He is not in acute distress.    Appearance: He is well-developed.  HENT:     Head: Atraumatic.     Right Ear: Tympanic membrane and external ear normal.     Left Ear: Tympanic membrane and external ear normal.     Nose: Nose normal.     Mouth/Throat:     Mouth: Mucous membranes are moist.     Pharynx: Oropharynx is clear.  Eyes:     General: No scleral icterus.    Conjunctiva/sclera: Conjunctivae normal.     Pupils: Pupils are equal, round, and reactive to light.  Cardiovascular:     Rate and Rhythm: Normal rate.     Pulses: Normal pulses.     Heart sounds: No murmur.  Pulmonary:     Effort: Pulmonary effort is normal. No respiratory distress.     Breath sounds: Normal breath sounds.  Abdominal:     General: Bowel sounds are normal. There is no distension.     Palpations: Abdomen is soft. There is no mass.     Tenderness: There is no abdominal tenderness. There is no guarding.  Genitourinary:    Comments: GU exam done through Urology Musculoskeletal:        General: No tenderness. Normal range of  motion.     Cervical back: Normal range of motion and neck supple.  Skin:    General: Skin is warm and dry.     Findings: No rash.  Neurological:     General: No focal deficit present.     Mental Status: He is alert. Mental status is at baseline.     Deep Tendon Reflexes: Reflexes are normal and symmetric.  Psychiatric:        Mood and Affect: Mood normal.        Behavior: Behavior normal.        Thought Content: Thought content normal.        Judgment: Judgment normal.    Diabetic Foot Exam - Simple   Simple Foot Form Diabetic Foot exam was performed with the following findings: Yes 12/02/2019  9:13 AM  Visual Inspection No deformities, no ulcerations, no other skin breakdown bilaterally: Yes Sensation Testing Intact to touch and monofilament testing bilaterally: Yes Pulse Check Posterior Tibialis and Dorsalis pulse intact bilaterally: Yes Comments     Results for orders placed or performed in visit on 12/02/19  CBC with Differential/Platelet out  Result Value Ref Range   WBC 6.5 3.4 - 10.8 x10E3/uL   RBC 3.83 (L) 4.14 - 5.80 x10E6/uL   Hemoglobin 11.8 (L) 13.0 - 17.7 g/dL   Hematocrit 36.7 (L) 37.5 - 51.0 %   MCV 96 79 - 97 fL   MCH 30.8 26.6 - 33.0 pg   MCHC 32.2 31.5 - 35.7 g/dL   RDW 13.2 11.6 - 15.4 %   Platelets 152 150 -  450 x10E3/uL   Neutrophils 66 Not Estab. %   Lymphs 24 Not Estab. %   Monocytes 8 Not Estab. %   Eos 1 Not Estab. %   Basos 0 Not Estab. %   Neutrophils Absolute 4.3 1.4 - 7.0 x10E3/uL   Lymphocytes Absolute 1.6 0.7 - 3.1 x10E3/uL   Monocytes Absolute 0.5 0.1 - 0.9 x10E3/uL   EOS (ABSOLUTE) 0.1 0.0 - 0.4 x10E3/uL   Basophils Absolute 0.0 0.0 - 0.2 x10E3/uL   Immature Granulocytes 1 Not Estab. %   Immature Grans (Abs) 0.0 0.0 - 0.1 x10E3/uL  Comprehensive metabolic panel  Result Value Ref Range   Glucose 162 (H) 65 - 99 mg/dL   BUN 19 8 - 27 mg/dL   Creatinine, Ser 0.91 0.76 - 1.27 mg/dL   GFR calc non Af Amer 80 >59 mL/min/1.73   GFR  calc Af Amer 93 >59 mL/min/1.73   BUN/Creatinine Ratio 21 10 - 24   Sodium 141 134 - 144 mmol/L   Potassium 4.3 3.5 - 5.2 mmol/L   Chloride 102 96 - 106 mmol/L   CO2 24 20 - 29 mmol/L   Calcium 9.3 8.6 - 10.2 mg/dL   Total Protein 7.2 6.0 - 8.5 g/dL   Albumin 4.6 3.7 - 4.7 g/dL   Globulin, Total 2.6 1.5 - 4.5 g/dL   Albumin/Globulin Ratio 1.8 1.2 - 2.2   Bilirubin Total 0.4 0.0 - 1.2 mg/dL   Alkaline Phosphatase 58 39 - 117 IU/L   AST 24 0 - 40 IU/L   ALT 18 0 - 44 IU/L  Lipid Panel w/o Chol/HDL Ratio out  Result Value Ref Range   Cholesterol, Total 137 100 - 199 mg/dL   Triglycerides 80 0 - 149 mg/dL   HDL 62 >39 mg/dL   VLDL Cholesterol Cal 16 5 - 40 mg/dL   LDL Chol Calc (NIH) 59 0 - 99 mg/dL  TSH  Result Value Ref Range   TSH 4.470 0.450 - 4.500 uIU/mL  UA/M w/rflx Culture, Routine   Specimen: Urine   URINE  Result Value Ref Range   Specific Gravity, UA 1.015 1.005 - 1.030   pH, UA 5.5 5.0 - 7.5   Color, UA Yellow Yellow   Appearance Ur Clear Clear   Leukocytes,UA Negative Negative   Protein,UA Negative Negative/Trace   Glucose, UA 3+ (A) Negative   Ketones, UA Negative Negative   RBC, UA Negative Negative   Bilirubin, UA Negative Negative   Urobilinogen, Ur 0.2 0.2 - 1.0 mg/dL   Nitrite, UA Negative Negative  HgB A1c  Result Value Ref Range   Hgb A1c MFr Bld 7.1 (H) 4.8 - 5.6 %   Est. average glucose Bld gHb Est-mCnc 157 mg/dL      Assessment & Plan:   Problem List Items Addressed This Visit      Cardiovascular and Mediastinum   Hypertension    BPs stable and WNL, continue current regimen      Relevant Medications   furosemide (LASIX) 20 MG tablet   isosorbide mononitrate (IMDUR) 60 MG 24 hr tablet   amLODipine (NORVASC) 5 MG tablet   atorvastatin (LIPITOR) 40 MG tablet   enalapril (VASOTEC) 20 MG tablet   Other Relevant Orders   CBC with Differential/Platelet out (Completed)   Comprehensive metabolic panel (Completed)   CAD (coronary artery  disease)    Recheck labs, continue current regimen per Cardiology recommendations. Lifestyle modifications reviewed      Relevant Medications   furosemide (LASIX)  20 MG tablet   isosorbide mononitrate (IMDUR) 60 MG 24 hr tablet   amLODipine (NORVASC) 5 MG tablet   atorvastatin (LIPITOR) 40 MG tablet   enalapril (VASOTEC) 20 MG tablet   Other Relevant Orders   Lipid Panel w/o Chol/HDL Ratio out (Completed)     Endocrine   Diabetes mellitus associated with hormonal etiology ([place])    Recheck A1C, adjust as needed. Continue working on diet control and continue present medications      Relevant Medications   atorvastatin (LIPITOR) 40 MG tablet   pioglitazone (ACTOS) 45 MG tablet   metFORMIN (GLUCOPHAGE) 500 MG tablet   enalapril (VASOTEC) 20 MG tablet   Other Relevant Orders   UA/M w/rflx Culture, Routine (Completed)   HgB A1c (Completed)   Hypothyroidism    Recheck TSH, continue current regimen      Relevant Medications   levothyroxine (SYNTHROID) 75 MCG tablet   Other Relevant Orders   TSH (Completed)     Genitourinary   BPH (benign prostatic hyperplasia)    Followed by Urology, continue present medication per their recommendations        Other   Hyperlipidemia    Will recheck lipids, continue current medication      Relevant Medications   furosemide (LASIX) 20 MG tablet   isosorbide mononitrate (IMDUR) 60 MG 24 hr tablet   amLODipine (NORVASC) 5 MG tablet   atorvastatin (LIPITOR) 40 MG tablet   enalapril (VASOTEC) 20 MG tablet   Depression, recurrent (H[facility])    Chronic, stable and under good control. Continue current regimen      Relevant Medications   DULoxetine (CYMBALTA) 30 MG capsule   Recurrent major depressive disorder, in full remission ([place])   Relevant Medications   DULoxetine (CYMBALTA) 30 MG capsule   Memory changes    Stable per wife, continue close monitoring. Has never been diagnosed with anything specific but exhibits dementia sxs the past few  years per wife. Memory decline, occasional disorientation but overall doing well.        Other Visit Diagnoses    Annual physical exam    -  Primary   Diabetes mellitus without complication ([place])       Relevant Medications   atorvastatin (LIPITOR) 40 MG tablet   pioglitazone (ACTOS) 45 MG tablet   metFORMIN (GLUCOPHAGE) 500 MG tablet   enalapril (VASOTEC) 20 MG tablet       Discussed aspirin prophylaxis for myocardial infarction prevention and decision was made to continue ASA  LABORATORY TESTING:  Health maintenance labs ordered today as discussed above.   The natural history of prostate cancer and ongoing controversy regarding screening and potential treatment outcomes of prostate cancer has been discussed with the patient. The meaning of a false positive PSA and a false negative PSA has been discussed. He indicates understanding of the limitations of this screening test and wishes not to proceed with screening PSA testing. This is followed by Urology   IMMUNIZATIONS:   - Tdap: Tetanus vaccination status reviewed: last tetanus booster within 10 years. - Influenza: Up to date - Pneumovax: Up to date - Prevnar: Up to date - HPV: Not applicable - Zostavax vaccine: Up to date  SCREENING: - Colonoscopy: Refused  Discussed with patient purpose of the colonoscopy is to detect colon cancer at curable precancerous or early stages   PATIENT COUNSELING:    Sexuality: Discussed sexually transmitted diseases, partner selection, use of condoms, avoidance of unintended pregnancy  and contraceptive alternatives.   Advised  to avoid cigarette smoking.  I discussed with the patient that most people either abstain from alcohol or drink within safe limits (<=14/week and <=4 drinks/occasion for males, <=7/weeks and <= 3 drinks/occasion for females) and that the risk for alcohol disorders and other health effects rises proportionally with the number of drinks per week and how often a drinker  exceeds daily limits.  Discussed cessation/primary prevention of drug use and availability of treatment for abuse.   Diet: Encouraged to adjust caloric intake to maintain  or achieve ideal body weight, to reduce intake of dietary saturated fat and total fat, to limit sodium intake by avoiding high sodium foods and not adding table salt, and to maintain adequate dietary potassium and calcium preferably from fresh fruits, vegetables, and low-fat dairy products.    stressed the importance of regular exercise  Injury prevention: Discussed safety belts, safety helmets, smoke detector, smoking near bedding or upholstery.   Dental health: Discussed importance of regular tooth brushing, flossing, and dental visits.   Follow up plan: NEXT PREVENTATIVE PHYSICAL DUE IN 1 YEAR. Return in about 6 months (around 05/31/2020) for 6 month f/u.

## 2019-12-03 LAB — CBC WITH DIFFERENTIAL/PLATELET
Basophils Absolute: 0 10*3/uL (ref 0.0–0.2)
Basos: 0 %
EOS (ABSOLUTE): 0.1 10*3/uL (ref 0.0–0.4)
Eos: 1 %
Hematocrit: 36.7 % — ABNORMAL LOW (ref 37.5–51.0)
Hemoglobin: 11.8 g/dL — ABNORMAL LOW (ref 13.0–17.7)
Immature Grans (Abs): 0 10*3/uL (ref 0.0–0.1)
Immature Granulocytes: 1 %
Lymphocytes Absolute: 1.6 10*3/uL (ref 0.7–3.1)
Lymphs: 24 %
MCH: 30.8 pg (ref 26.6–33.0)
MCHC: 32.2 g/dL (ref 31.5–35.7)
MCV: 96 fL (ref 79–97)
Monocytes Absolute: 0.5 10*3/uL (ref 0.1–0.9)
Monocytes: 8 %
Neutrophils Absolute: 4.3 10*3/uL (ref 1.4–7.0)
Neutrophils: 66 %
Platelets: 152 10*3/uL (ref 150–450)
RBC: 3.83 x10E6/uL — ABNORMAL LOW (ref 4.14–5.80)
RDW: 13.2 % (ref 11.6–15.4)
WBC: 6.5 10*3/uL (ref 3.4–10.8)

## 2019-12-03 LAB — COMPREHENSIVE METABOLIC PANEL
ALT: 18 IU/L (ref 0–44)
AST: 24 IU/L (ref 0–40)
Albumin/Globulin Ratio: 1.8 (ref 1.2–2.2)
Albumin: 4.6 g/dL (ref 3.7–4.7)
Alkaline Phosphatase: 58 IU/L (ref 39–117)
BUN/Creatinine Ratio: 21 (ref 10–24)
BUN: 19 mg/dL (ref 8–27)
Bilirubin Total: 0.4 mg/dL (ref 0.0–1.2)
CO2: 24 mmol/L (ref 20–29)
Calcium: 9.3 mg/dL (ref 8.6–10.2)
Chloride: 102 mmol/L (ref 96–106)
Creatinine, Ser: 0.91 mg/dL (ref 0.76–1.27)
GFR calc Af Amer: 93 mL/min/{1.73_m2} (ref >59)
GFR calc non Af Amer: 80 mL/min/{1.73_m2} (ref >59)
Globulin, Total: 2.6 g/dL (ref 1.5–4.5)
Glucose: 162 mg/dL — ABNORMAL HIGH (ref 65–99)
Potassium: 4.3 mmol/L (ref 3.5–5.2)
Sodium: 141 mmol/L (ref 134–144)
Total Protein: 7.2 g/dL (ref 6.0–8.5)

## 2019-12-03 LAB — LIPID PANEL W/O CHOL/HDL RATIO
Cholesterol, Total: 137 mg/dL (ref 100–199)
HDL: 62 mg/dL (ref >39)
LDL Chol Calc (NIH): 59 mg/dL (ref 0–99)
Triglycerides: 80 mg/dL (ref 0–149)
VLDL Cholesterol Cal: 16 mg/dL (ref 5–40)

## 2019-12-03 LAB — TSH: TSH: 4.47 u[IU]/mL (ref 0.450–4.500)

## 2019-12-03 LAB — HEMOGLOBIN A1C
Est. average glucose Bld gHb Est-mCnc: 157 mg/dL
Hgb A1c MFr Bld: 7.1 % — ABNORMAL HIGH (ref 4.8–5.6)

## 2019-12-08 MED ORDER — METFORMIN HCL 500 MG PO TABS: 1000.0000 mg | ORAL_TABLET | Freq: Two times a day (BID) | ORAL | 1 refills | Status: DC

## 2019-12-08 MED ORDER — PIOGLITAZONE HCL 45 MG PO TABS: 45.0000 mg | ORAL_TABLET | Freq: Every day | ORAL | 1 refills | Status: DC

## 2019-12-08 MED ORDER — ATORVASTATIN CALCIUM 40 MG PO TABS: 40.0000 mg | ORAL_TABLET | Freq: Every day | ORAL | 1 refills | Status: DC

## 2019-12-08 MED ORDER — DULOXETINE HCL 30 MG PO CPEP: 30.0000 mg | ORAL_CAPSULE | Freq: Every day | ORAL | 1 refills | Status: DC

## 2019-12-08 MED ORDER — LEVOTHYROXINE SODIUM 75 MCG PO TABS: 75.0000 ug | ORAL_TABLET | Freq: Every day | ORAL | 1 refills | Status: DC

## 2019-12-08 MED ORDER — ENALAPRIL MALEATE 20 MG PO TABS: 20.0000 mg | ORAL_TABLET | Freq: Every day | ORAL | 1 refills | Status: DC

## 2019-12-08 MED ORDER — PANTOPRAZOLE SODIUM 20 MG PO TBEC: 20.0000 mg | DELAYED_RELEASE_TABLET | Freq: Every day | ORAL | 1 refills | Status: DC

## 2019-12-08 MED ORDER — AMLODIPINE BESYLATE 5 MG PO TABS: 5.0000 mg | ORAL_TABLET | Freq: Every day | ORAL | 1 refills | Status: DC

## 2019-12-08 NOTE — Assessment & Plan Note (Signed)
Stable per wife, continue close monitoring. Has never been diagnosed with anything specific but exhibits dementia sxs the past few years per wife. Memory decline, occasional disorientation but overall doing well.

## 2019-12-08 NOTE — Assessment & Plan Note (Signed)
Chronic, stable and under good control. Continue current regimen °

## 2019-12-08 NOTE — Assessment & Plan Note (Signed)
Recheck TSH, continue current regimen

## 2019-12-08 NOTE — Assessment & Plan Note (Signed)
Will recheck lipids, continue current medication

## 2019-12-08 NOTE — Assessment & Plan Note (Signed)
BPs stable and WNL, continue current regimen 

## 2019-12-08 NOTE — Assessment & Plan Note (Signed)
Recheck A1C, adjust as needed. Continue working on diet control and continue present medications

## 2019-12-08 NOTE — Assessment & Plan Note (Signed)
Recheck labs, continue current regimen per Cardiology recommendations. Lifestyle modifications reviewed

## 2019-12-08 NOTE — Assessment & Plan Note (Signed)
Followed by Urology, continue present medication per their recommendations

## 2019-12-30 DIAGNOSIS — Z950 Presence of cardiac pacemaker: Secondary | ICD-10-CM | POA: Diagnosis not present

## 2019-12-30 DIAGNOSIS — E785 Hyperlipidemia, unspecified: Secondary | ICD-10-CM | POA: Diagnosis not present

## 2019-12-30 DIAGNOSIS — I4891 Unspecified atrial fibrillation: Secondary | ICD-10-CM | POA: Diagnosis not present

## 2019-12-30 DIAGNOSIS — I25701 Atherosclerosis of coronary artery bypass graft(s), unspecified, with angina pectoris with documented spasm: Secondary | ICD-10-CM | POA: Diagnosis not present

## 2020-01-04 ENCOUNTER — Other Ambulatory Visit: Payer: Self-pay

## 2020-01-04 MED ORDER — IRON 325 (65 FE) MG PO TABS: ORAL_TABLET | ORAL | 1 refills | Status: DC

## 2020-01-04 NOTE — Telephone Encounter (Signed)
Refill request for Iron Tablets  LOV: 12/02/2019 Next Appt: 06/08/2020

## 2020-01-20 IMAGING — CR DG CHEST 2V
2 series · 2 of 2 positions shown · non-contrast
Comparison: Chest radiograph 03/17/2017

CLINICAL DATA: Patient with left-sided chest heaviness.

EXAM:
CHEST - 2 VIEW

[chest pa]
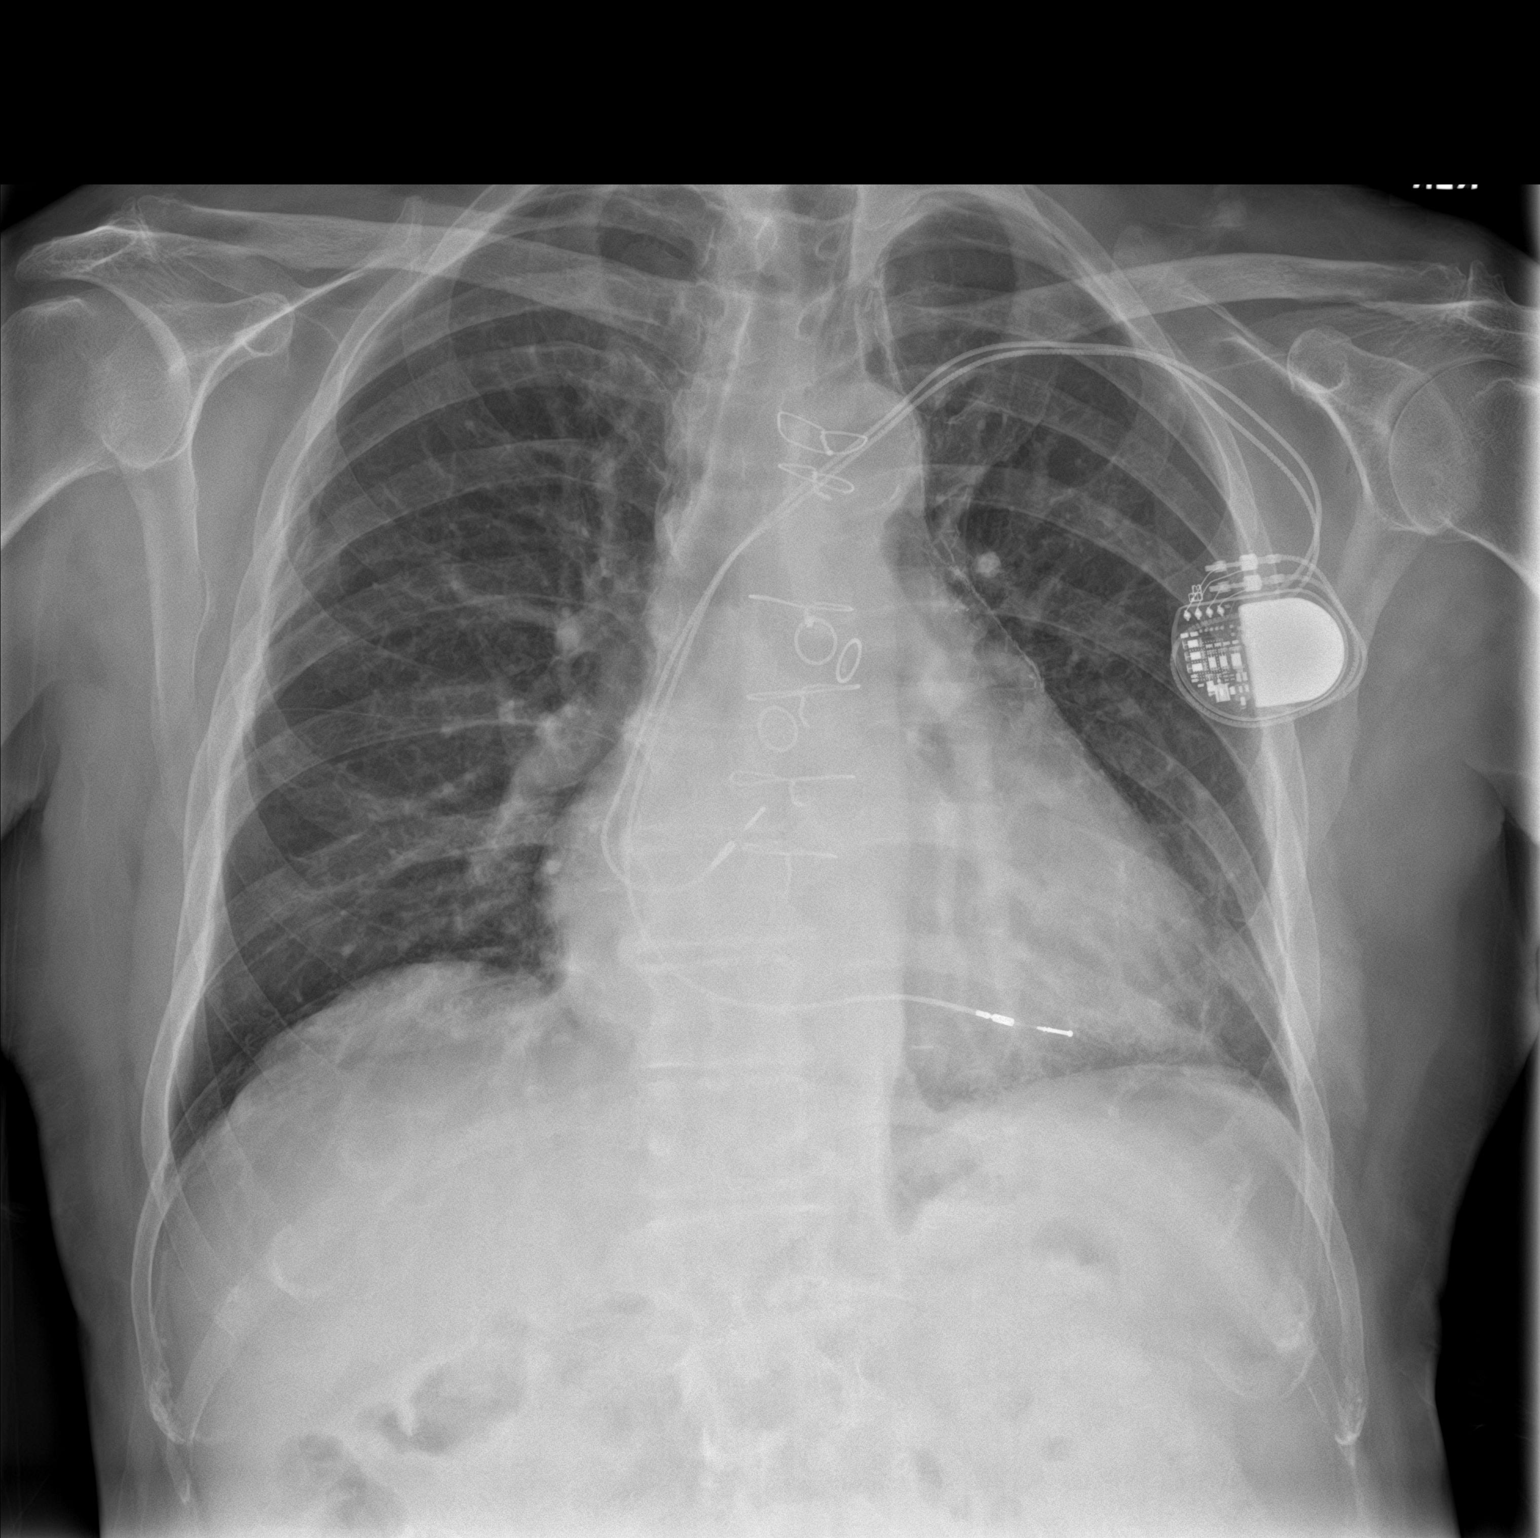

[chest lat]
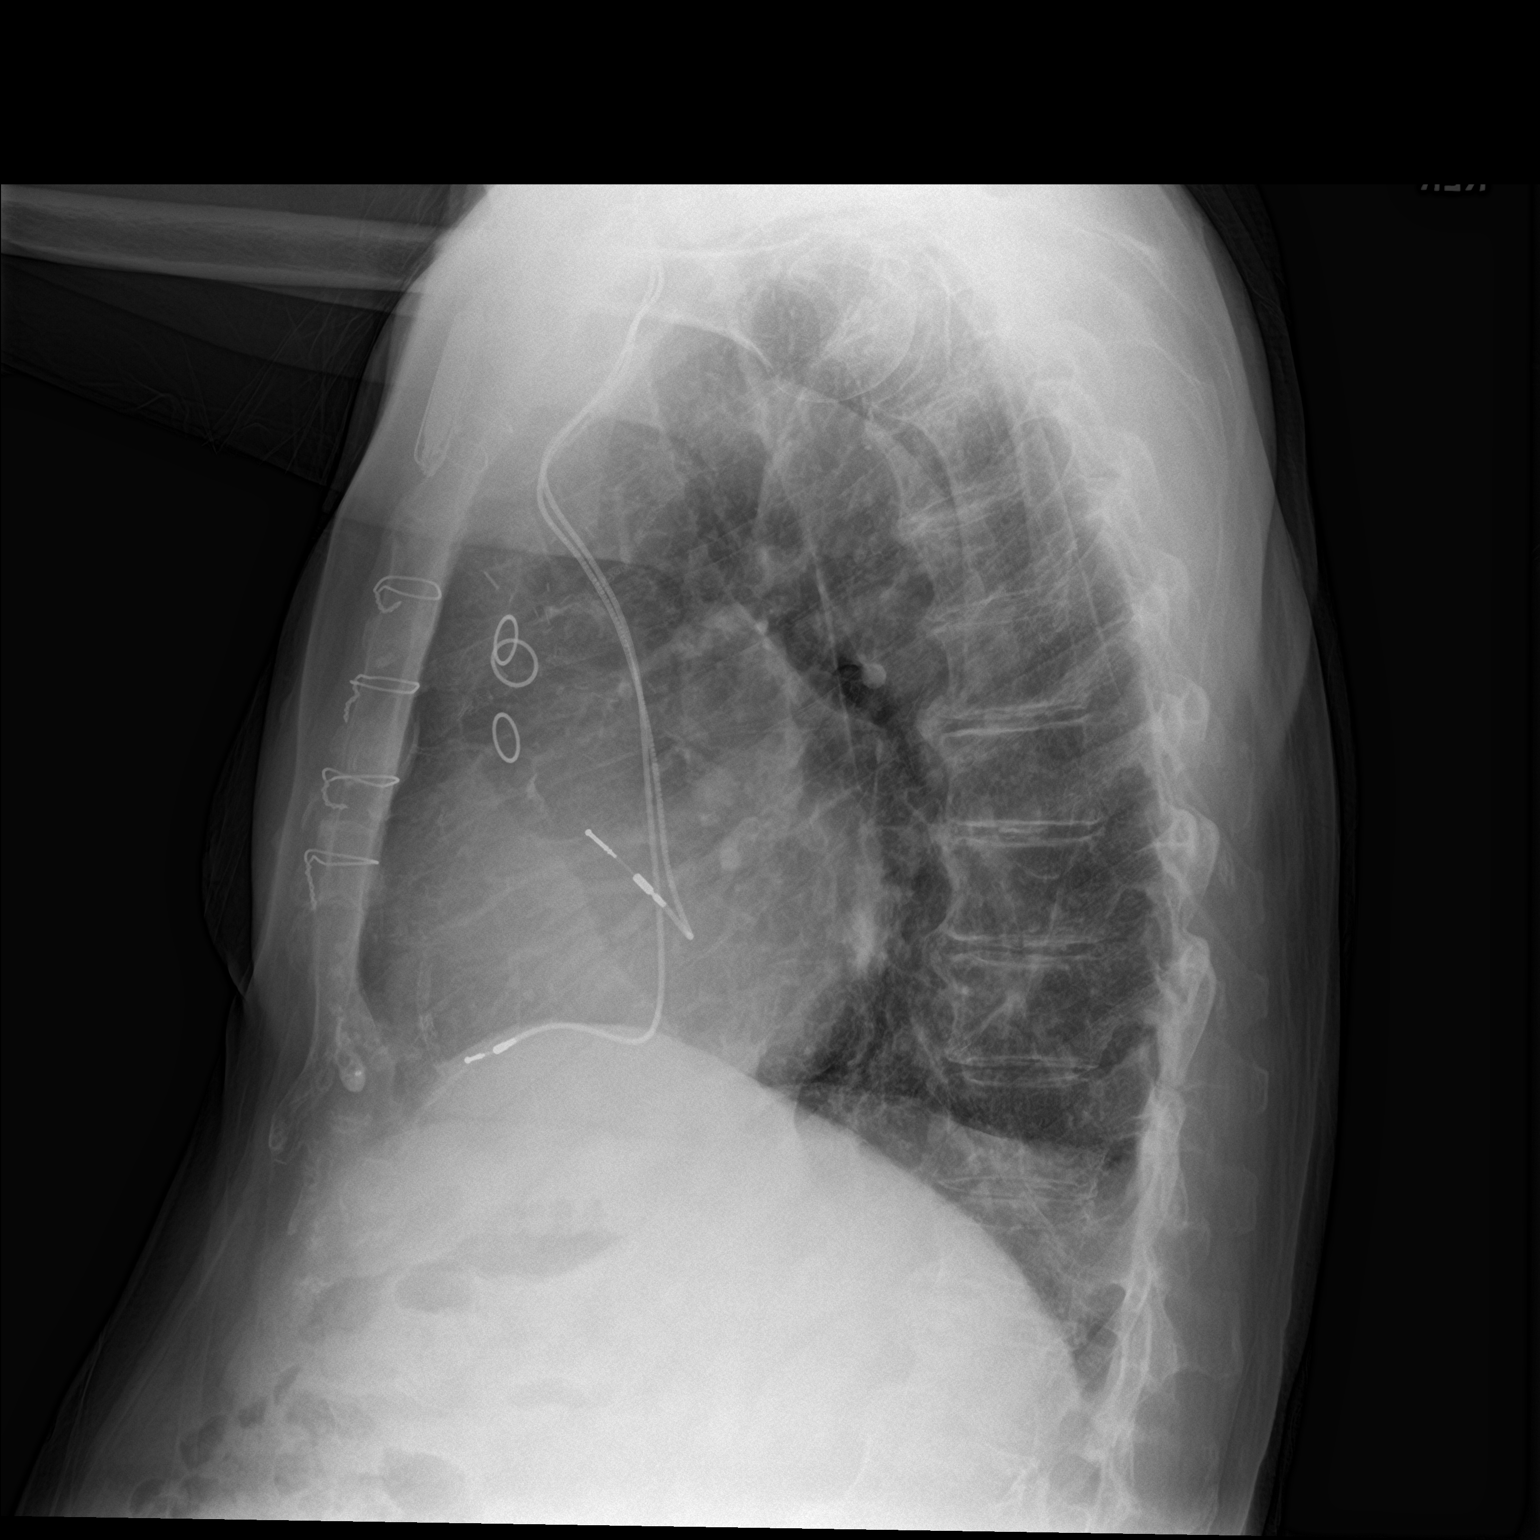

[2 of 2 positions shown; findings below may reference images not displayed]

FINDINGS: Multi lead pacer apparatus overlies the left hemithorax. Stable
cardiomegaly status post median sternotomy. No large area pulmonary
consolidation. No pleural effusion or pneumothorax. Thoracic spine
degenerative changes.
IMPRESSION: No acute cardiopulmonary process.

## 2020-01-24 DIAGNOSIS — I4892 Unspecified atrial flutter: Secondary | ICD-10-CM | POA: Diagnosis not present

## 2020-01-24 DIAGNOSIS — I4891 Unspecified atrial fibrillation: Secondary | ICD-10-CM | POA: Diagnosis not present

## 2020-01-24 DIAGNOSIS — E782 Mixed hyperlipidemia: Secondary | ICD-10-CM | POA: Diagnosis not present

## 2020-01-24 DIAGNOSIS — I739 Peripheral vascular disease, unspecified: Secondary | ICD-10-CM | POA: Diagnosis not present

## 2020-02-15 ENCOUNTER — Other Ambulatory Visit: Payer: Self-pay | Admitting: Urology

## 2020-03-30 ENCOUNTER — Encounter (INDEPENDENT_AMBULATORY_CARE_PROVIDER_SITE_OTHER): Payer: Self-pay

## 2020-03-30 ENCOUNTER — Other Ambulatory Visit: Payer: Self-pay

## 2020-03-30 ENCOUNTER — Ambulatory Visit (INDEPENDENT_AMBULATORY_CARE_PROVIDER_SITE_OTHER): Payer: Medicare Other | Admitting: Internal Medicine

## 2020-03-30 DIAGNOSIS — I495 Sick sinus syndrome: Secondary | ICD-10-CM

## 2020-03-30 DIAGNOSIS — Z950 Presence of cardiac pacemaker: Secondary | ICD-10-CM | POA: Diagnosis not present

## 2020-03-31 NOTE — Progress Notes (Addendum)
Established Patient Office Visit  Subjective:  Patient ID: Paul Parker, male    DOB: [birth year]  Age: 79 y.o. MRN: [id number]  CC:  Chief Complaint  Patient presents with  . Pacemaker Check    HPI  Paul Parker presents for pacemaker check.  Patient is also known to have a sick sinus syndrome and anxiety neurosis he does not give any history of tachyarrhythmia or passing out spell recently  Past Medical History:  Diagnosis Date  . CAD (coronary artery disease)   . Diabetes mellitus without complication ([place])   . Heart murmur   . Hyperlipidemia   . Hypertension   . Prostate cancer [facility])     Past Surgical History:  Procedure Laterality Date  . CORONARY ARTERY BYPASS GRAFT    . CORONARY STENT PLACEMENT    . HERNIA REPAIR    . PACEMAKER PLACEMENT      Family History  Problem Relation Age of Onset  . Heart disease Mother   . Heart disease Father     Social History   Socioeconomic History  . Marital status: Married    Spouse name: Not on file  . Number of children: Not on file  . Years of education: Not on file  . Highest education level: Not on file  Occupational History  . Not on file  Tobacco Use  . Smoking status: Never Smoker  . Smokeless tobacco: Never Used  Substance and Sexual Activity  . Alcohol use: No  . Drug use: No  . Sexual activity: Not on file  Other Topics Concern  . Not on file  Social History Narrative  . Not on file   Social Determinants of Health   Financial Resource Strain:   . Difficulty of Paying Living Expenses:   Food Insecurity:   . Worried About Charity fundraiser in the Last Year:   . Arboriculturist in the Last Year:   Transportation Needs:   . Film/video editor (Medical):   Marland Kitchen Lack of Transportation (Non-Medical):   Physical Activity:   . Days of Exercise per Week:   . Minutes of Exercise per Session:   Stress:   . Feeling of Stress :   Social Connections:   . Frequency of Communication with Friends and Family:   .  Frequency of Social Gatherings with Friends and Family:   . Attends Religious Services:   . Active Member of Clubs or Organizations:   . Attends Archivist Meetings:   Marland Kitchen Marital Status:   Intimate Partner Violence:   . Fear of Current or Ex-Partner:   . Emotionally Abused:   Marland Kitchen Physically Abused:   . Sexually Abused:      Current Outpatient Medications:  .  amiodarone (PACERONE) 200 MG tablet, Take 100 mg by mouth daily. , Disp: , Rfl:  .  amLODipine (NORVASC) 5 MG tablet, Take 1 tablet (5 mg total) by mouth daily., Disp: 90 tablet, Rfl: 1 .  aspirin EC 81 MG tablet, Take 81 mg by mouth daily., Disp: , Rfl:  .  atorvastatin (LIPITOR) 40 MG tablet, Take 1 tablet (40 mg total) by mouth daily., Disp: 90 tablet, Rfl: 1 .  clopidogrel (PLAVIX) 75 MG tablet, TAKE 1 TABLET BY MOUTH ONCE DAILY, Disp: 90 tablet, Rfl: 2 .  Docusate Calcium (STOOL SOFTENER PO), Take by mouth daily., Disp: , Rfl:  .  DULoxetine (CYMBALTA) 30 MG capsule, Take 1 capsule (30 mg total) by mouth daily., Disp: 90  capsule, Rfl: 1 .  enalapril (VASOTEC) 20 MG tablet, Take 1 tablet (20 mg total) by mouth daily., Disp: 90 tablet, Rfl: 1 .  Ferrous Sulfate (IRON PO), Take by mouth daily. 65 mg, Disp: , Rfl:  .  Ferrous Sulfate (IRON) 325 (65 Fe) MG TABS, Take 1 tablet by mouth once daily with breakfast, Disp: 90 tablet, Rfl: 1 .  finasteride (PROSCAR) 5 MG tablet, Take 1 tablet by mouth once daily, Disp: 90 tablet, Rfl: 0 .  fluticasone (FLONASE) 50 MCG/ACT nasal spray, Place 2 sprays into both nostrils 2 (two) times daily. (Patient not taking: Reported on 12/02/2019), Disp: 16 g, Rfl: 6 .  furosemide (LASIX) 20 MG tablet, Take 20 mg by mouth daily., Disp: , Rfl:  .  glucose blood (ONETOUCH ULTRA) test strip, 1 each by Other route daily as needed for other. Use as instructed, Disp: 100 each, Rfl: 0 .  isosorbide mononitrate (IMDUR) 60 MG 24 hr tablet, Take 60 mg by mouth daily., Disp: , Rfl:  .  levothyroxine  (SYNTHROID) 75 MCG tablet, Take 1 tablet (75 mcg total) by mouth daily before breakfast., Disp: 90 tablet, Rfl: 1 .  metFORMIN (GLUCOPHAGE) 500 MG tablet, Take 2 tablets (1,000 mg total) by mouth 2 (two) times daily with a meal., Disp: 360 tablet, Rfl: 1 .  ONETOUCH DELICA LANCETS 99991111 MISC, USE TO CHECK BLOOD SUGAR ONCE A DAY, Disp: 100 each, Rfl: 12 .  pantoprazole (PROTONIX) 20 MG tablet, Take 1 tablet (20 mg total) by mouth daily., Disp: 90 tablet, Rfl: 1 .  pioglitazone (ACTOS) 45 MG tablet, Take 1 tablet (45 mg total) by mouth daily., Disp: 90 tablet, Rfl: 1   Allergies  Allergen Reactions  . Amoxicillin Diarrhea and Nausea And Vomiting    ROS Review of Systems  Constitutional: Negative for chills.  HENT: Negative for ear discharge.   Musculoskeletal: Negative for back pain.  Neurological: Negative for numbness.  Psychiatric/Behavioral: Negative for behavioral problems.  All other systems reviewed and are negative.     Objective:    Physical Exam  Constitutional: He appears well-developed.  HENT:  Head: Normocephalic.  Eyes: Pupils are equal, round, and reactive to light.  Cardiovascular: Normal rate.  No murmur heard. Pulmonary/Chest: He has wheezes.  Abdominal: Soft.    There were no vitals taken for this visit. Wt Readings from Last 3 Encounters:  12/02/19 177 lb (80.3 kg)  10/28/19 174 lb (78.9 kg)  08/07/19 168 lb (76.2 kg)     Health Maintenance Due  Topic Date Due  . COVID-19 Vaccine (1) Never done  . OPHTHALMOLOGY EXAM  12/05/2016    There are no preventive care reminders to display for this patient.  Lab Results  Component Value Date   TSH 4.470 12/02/2019   Lab Results  Component Value Date   WBC 6.5 12/02/2019   HGB 11.8 (L) 12/02/2019   HCT 36.7 (L) 12/02/2019   MCV 96 12/02/2019   PLT 152 12/02/2019   Lab Results  Component Value Date   NA 141 12/02/2019   K 4.3 12/02/2019   CO2 24 12/02/2019   GLUCOSE 162 (H) 12/02/2019   BUN 19  12/02/2019   CREATININE 0.91 12/02/2019   BILITOT 0.4 12/02/2019   ALKPHOS 58 12/02/2019   AST 24 12/02/2019   ALT 18 12/02/2019   PROT 7.2 12/02/2019   ALBUMIN 4.6 12/02/2019   CALCIUM 9.3 12/02/2019   ANIONGAP 13 08/07/2019   Lab Results  Component Value Date  CHOL 137 12/02/2019   Lab Results  Component Value Date   HDL 62 12/02/2019   Lab Results  Component Value Date   LDLCALC 59 12/02/2019   Lab Results  Component Value Date   TRIG 80 12/02/2019   Lab Results  Component Value Date   CHOLHDL 2.2 11/04/2018   Lab Results  Component Value Date   HGBA1C 7.1 (H) 12/02/2019      Assessment & Plan:   Problem List Items Addressed This Visit    None    Visit Diagnoses    Cardiac pacemaker in situ    -  Primary   Relevant Orders   PACEMAKER IN CLINIC CHECK     Note: Medical Device Follow-up  Patient pacemaker was interrogated by pacemakers analyzer, battery status is okay.  No programming changes were indicated after the review of the data.  Histogram shows no change since the last interrogation Atrial and ventricular sensing thresholds were found to be acceptable Impedance was checked and it was found to be normal.  Thresholds were found to be okay on evaluation of rhythm problem.  No high rate or low rate arrhythmia were noted.  Estimated battery longevity is 2.5 I have personally reviewed the device data and amended the report as necessary.  No orders of the defined types were placed in this encounter.   Follow-up: Return in about 3 months (around 06/30/2020).    Cletis Athens, MD

## 2020-04-05 DIAGNOSIS — I495 Sick sinus syndrome: Secondary | ICD-10-CM | POA: Insufficient documentation

## 2020-04-05 DIAGNOSIS — Z950 Presence of cardiac pacemaker: Secondary | ICD-10-CM | POA: Insufficient documentation

## 2020-04-13 ENCOUNTER — Encounter: Payer: Self-pay | Admitting: Internal Medicine

## 2020-04-13 ENCOUNTER — Ambulatory Visit (INDEPENDENT_AMBULATORY_CARE_PROVIDER_SITE_OTHER): Payer: Medicare Other | Admitting: Internal Medicine

## 2020-04-13 ENCOUNTER — Other Ambulatory Visit: Payer: Self-pay

## 2020-04-13 VITALS — BP 109/53 | HR 62

## 2020-04-13 DIAGNOSIS — E039 Hypothyroidism, unspecified: Secondary | ICD-10-CM

## 2020-04-13 DIAGNOSIS — D692 Other nonthrombocytopenic purpura: Secondary | ICD-10-CM

## 2020-04-13 DIAGNOSIS — I10 Essential (primary) hypertension: Secondary | ICD-10-CM

## 2020-04-13 DIAGNOSIS — I495 Sick sinus syndrome: Secondary | ICD-10-CM

## 2020-04-13 DIAGNOSIS — R413 Other amnesia: Secondary | ICD-10-CM

## 2020-04-13 DIAGNOSIS — I25111 Atherosclerotic heart disease of native coronary artery with angina pectoris with documented spasm: Secondary | ICD-10-CM

## 2020-04-13 DIAGNOSIS — Z950 Presence of cardiac pacemaker: Secondary | ICD-10-CM

## 2020-04-13 NOTE — Progress Notes (Signed)
Established Patient Office Visit  Subjective:  Patient ID: Paul Parker, male    DOB: 16-May-1941  Age: 79 y.o. MRN: [id number]  CC:  Chief Complaint  Patient presents with  . Pacemaker Check    patient here today for recheck of pacemaker    HPI  Paul Parker presents for pacemaker check..  Patient denies any history of chest pain or shortness of breath.  Past Medical History:  Diagnosis Date  . CAD (coronary artery disease)   . Diabetes mellitus without complication ([place])   . Heart murmur   . Hyperlipidemia   . Hypertension   . Prostate cancer [facility])     Past Surgical History:  Procedure Laterality Date  . CORONARY ARTERY BYPASS GRAFT    . CORONARY STENT PLACEMENT    . HERNIA REPAIR    . PACEMAKER PLACEMENT      Family History  Problem Relation Age of Onset  . Heart disease Mother   . Heart disease Father     Social History   Socioeconomic History  . Marital status: Married    Spouse name: Not on file  . Number of children: Not on file  . Years of education: Not on file  . Highest education level: Not on file  Occupational History  . Not on file  Tobacco Use  . Smoking status: Never Smoker  . Smokeless tobacco: Never Used  Substance and Sexual Activity  . Alcohol use: No  . Drug use: No  . Sexual activity: Not on file  Other Topics Concern  . Not on file  Social History Narrative  . Not on file   Social Determinants of Health   Financial Resource Strain:   . Difficulty of Paying Living Expenses:   Food Insecurity:   . Worried About Charity fundraiser in the Last Year:   . Arboriculturist in the Last Year:   Transportation Needs:   . Film/video editor (Medical):   Marland Kitchen Lack of Transportation (Non-Medical):   Physical Activity:   . Days of Exercise per Week:   . Minutes of Exercise per Session:   Stress:   . Feeling of Stress :   Social Connections:   . Frequency of Communication with Friends and Family:   . Frequency of Social Gatherings with  Friends and Family:   . Attends Religious Services:   . Active Member of Clubs or Organizations:   . Attends Archivist Meetings:   Marland Kitchen Marital Status:   Intimate Partner Violence:   . Fear of Current or Ex-Partner:   . Emotionally Abused:   Marland Kitchen Physically Abused:   . Sexually Abused:      Current Outpatient Medications:  .  amiodarone (PACERONE) 200 MG tablet, Take 100 mg by mouth daily. , Disp: , Rfl:  .  amLODipine (NORVASC) 5 MG tablet, Take 1 tablet (5 mg total) by mouth daily., Disp: 90 tablet, Rfl: 1 .  aspirin EC 81 MG tablet, Take 81 mg by mouth daily., Disp: , Rfl:  .  atorvastatin (LIPITOR) 40 MG tablet, Take 1 tablet (40 mg total) by mouth daily., Disp: 90 tablet, Rfl: 1 .  clopidogrel (PLAVIX) 75 MG tablet, TAKE 1 TABLET BY MOUTH ONCE DAILY, Disp: 90 tablet, Rfl: 2 .  Docusate Calcium (STOOL SOFTENER PO), Take by mouth daily., Disp: , Rfl:  .  DULoxetine (CYMBALTA) 30 MG capsule, Take 1 capsule (30 mg total) by mouth daily., Disp: 90 capsule, Rfl: 1 .  enalapril (VASOTEC) 20 MG tablet, Take 1 tablet (20 mg total) by mouth daily., Disp: 90 tablet, Rfl: 1 .  Ferrous Sulfate (IRON PO), Take by mouth daily. 65 mg, Disp: , Rfl:  .  Ferrous Sulfate (IRON) 325 (65 Fe) MG TABS, Take 1 tablet by mouth once daily with breakfast, Disp: 90 tablet, Rfl: 1 .  finasteride (PROSCAR) 5 MG tablet, Take 1 tablet by mouth once daily, Disp: 90 tablet, Rfl: 0 .  fluticasone (FLONASE) 50 MCG/ACT nasal spray, Place 2 sprays into both nostrils 2 (two) times daily., Disp: 16 g, Rfl: 6 .  furosemide (LASIX) 20 MG tablet, Take 20 mg by mouth daily., Disp: , Rfl:  .  glucose blood (ONETOUCH ULTRA) test strip, 1 each by Other route daily as needed for other. Use as instructed, Disp: 100 each, Rfl: 0 .  isosorbide mononitrate (IMDUR) 60 MG 24 hr tablet, Take 60 mg by mouth daily., Disp: , Rfl:  .  levothyroxine (SYNTHROID) 75 MCG tablet, Take 1 tablet (75 mcg total) by mouth daily before breakfast.,  Disp: 90 tablet, Rfl: 1 .  metFORMIN (GLUCOPHAGE) 500 MG tablet, Take 2 tablets (1,000 mg total) by mouth 2 (two) times daily with a meal., Disp: 360 tablet, Rfl: 1 .  ONETOUCH DELICA LANCETS 99991111 MISC, USE TO CHECK BLOOD SUGAR ONCE A DAY, Disp: 100 each, Rfl: 12 .  pantoprazole (PROTONIX) 20 MG tablet, Take 1 tablet (20 mg total) by mouth daily., Disp: 90 tablet, Rfl: 1 .  pioglitazone (ACTOS) 45 MG tablet, Take 1 tablet (45 mg total) by mouth daily., Disp: 90 tablet, Rfl: 1   Allergies  Allergen Reactions  . Amoxicillin Diarrhea and Nausea And Vomiting    ROS Review of Systems  Constitutional: Negative.   HENT: Negative.   Eyes: Negative.   Respiratory: Negative.   Cardiovascular: Negative.   Gastrointestinal: Negative.   Endocrine: Negative.   Genitourinary: Negative.   Hematological: Negative.       Objective:    Physical Exam  Constitutional: He appears well-developed and well-nourished.  HENT:  Head: Normocephalic.  Eyes: Pupils are equal, round, and reactive to light. No scleral icterus.  Neck: No JVD present. No tracheal deviation present.  Cardiovascular:  No murmur heard. Pulmonary/Chest: He has no wheezes.  Abdominal: There is no abdominal tenderness.  Musculoskeletal:        General: No edema.     Cervical back: Neck supple.  Lymphadenopathy:    He has no cervical adenopathy.  Neurological: No cranial nerve deficit.  Psychiatric: He has a normal mood and affect.    BP (!) 109/53   Pulse 62  Wt Readings from Last 3 Encounters:  12/02/19 177 lb (80.3 kg)  10/28/19 174 lb (78.9 kg)  08/07/19 168 lb (76.2 kg)     Health Maintenance Due  Topic Date Due  . COVID-19 Vaccine (1) Never done  . OPHTHALMOLOGY EXAM  12/05/2016    There are no preventive care reminders to display for this patient.  Lab Results  Component Value Date   TSH 4.470 12/02/2019   Lab Results  Component Value Date   WBC 6.5 12/02/2019   HGB 11.8 (L) 12/02/2019   HCT 36.7  (L) 12/02/2019   MCV 96 12/02/2019   PLT 152 12/02/2019   Lab Results  Component Value Date   NA 141 12/02/2019   K 4.3 12/02/2019   CO2 24 12/02/2019   GLUCOSE 162 (H) 12/02/2019   BUN 19 12/02/2019   CREATININE  0.91 12/02/2019   BILITOT 0.4 12/02/2019   ALKPHOS 58 12/02/2019   AST 24 12/02/2019   ALT 18 12/02/2019   PROT 7.2 12/02/2019   ALBUMIN 4.6 12/02/2019   CALCIUM 9.3 12/02/2019   ANIONGAP 13 08/07/2019   Lab Results  Component Value Date   CHOL 137 12/02/2019   Lab Results  Component Value Date   HDL 62 12/02/2019   Lab Results  Component Value Date   LDLCALC 59 12/02/2019   Lab Results  Component Value Date   TRIG 80 12/02/2019   Lab Results  Component Value Date   CHOLHDL 2.2 11/04/2018   Lab Results  Component Value Date   HGBA1C 7.1 (H) 12/02/2019      Assessment & Plan:   Problem List Items Addressed This Visit      Cardiovascular and Mediastinum   Hypertension   Senile purpura (HCC)   CAD (coronary artery disease)    Chest pain or shortness of breath.  Is not present.      SSS (sick sinus syndrome) (HCC)    Complain of intermittent tachycardia,.  Pacemaker was interrogated and the report was sent to [facility] for the record.          Endocrine   Hypothyroidism    Stable stable at the present time.        Other   Memory changes    Chronic problem.      Cardiac pacemaker in situ - Primary   Relevant Orders   PACEMAKER IN CLINIC CHECK      No orders of the defined types were placed in this encounter.   Cletis Athens M.D.  04/21/2020 11:02 AM    2. SSS (sick sinus syndrome) (HCC) Stable.   Pacemaker was checked by using pacemaker system analyzer 2 weeks ago.  Battery life is more than 3 months.  Run of atrial tachycardia was noted..  Patient has been pacing more than 80% of the time.  Impedance is 614 which is normal lead status is okay.  Occasional PVCs or runs of PVCs were noted patient is on  Plavix right now  he is not a candidate for anticoagulation because of thrombocytopenia and anemia. 3. Coronary artery disease involving native coronary artery of native heart with angina pectoris with documented spasm (HCC) Pain.  4. Hypothyroidism, unspecified type .  5. Senile purpura (HCC) We will  6. Essential hypertension Pressure is okay today  7. Memory changes Chronic problem.  Follow-up: Return in about 3 months (around 07/14/2020).    Cletis Athens, MD

## 2020-04-13 NOTE — Assessment & Plan Note (Signed)
Stable stable at the present time.

## 2020-04-13 NOTE — Assessment & Plan Note (Signed)
Complain of intermittent tachycardia,.  Pacemaker was interrogated and the report was sent to [facility] for the record.

## 2020-04-13 NOTE — Assessment & Plan Note (Signed)
Chronic problem. 

## 2020-04-13 NOTE — Assessment & Plan Note (Addendum)
Chest pain or shortness of breath.  Is not present.

## 2020-04-21 ENCOUNTER — Encounter: Payer: Self-pay | Admitting: Internal Medicine

## 2020-04-25 DIAGNOSIS — I4892 Unspecified atrial flutter: Secondary | ICD-10-CM | POA: Diagnosis not present

## 2020-04-25 DIAGNOSIS — E782 Mixed hyperlipidemia: Secondary | ICD-10-CM | POA: Diagnosis not present

## 2020-04-25 DIAGNOSIS — I739 Peripheral vascular disease, unspecified: Secondary | ICD-10-CM | POA: Diagnosis not present

## 2020-04-25 DIAGNOSIS — I4891 Unspecified atrial fibrillation: Secondary | ICD-10-CM | POA: Diagnosis not present

## 2020-05-22 ENCOUNTER — Other Ambulatory Visit: Payer: Self-pay | Admitting: Urology

## 2020-05-31 ENCOUNTER — Ambulatory Visit: Payer: Medicare Other | Admitting: Family Medicine

## 2020-06-05 ENCOUNTER — Other Ambulatory Visit: Payer: Self-pay | Admitting: Family Medicine

## 2020-06-05 DIAGNOSIS — E039 Hypothyroidism, unspecified: Secondary | ICD-10-CM

## 2020-06-05 NOTE — Telephone Encounter (Signed)
Routing to provider, see message below.

## 2020-06-05 NOTE — Telephone Encounter (Signed)
Requested medication (s) are due for refill today: yes  Requested medication (s) are on the active medication list: levothyroxine is- Euthyrox is not  Last refill:  12/08/19  Future visit scheduled: yes  Notes to clinic:  pharmacy is requesting Euthyrox - dose is same and sig - but brand name is different   Requested Prescriptions  Pending Prescriptions Disp Refills   EUTHYROX 3 MCG tablet [Pharmacy Med Name: Euthyrox 75 MCG Oral Tablet] 90 tablet 0    Sig: TAKE 1 TABLET BY MOUTH ONCE DAILY IN THE [place]      Endocrinology:  Hypothyroid Agents Failed - 06/05/2020 11:35 AM      Failed - TSH needs to be rechecked within 3 months after an abnormal result. Refill until TSH is due.      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

## 2020-06-08 ENCOUNTER — Ambulatory Visit (INDEPENDENT_AMBULATORY_CARE_PROVIDER_SITE_OTHER): Payer: Medicare Other | Admitting: Family Medicine

## 2020-06-08 ENCOUNTER — Other Ambulatory Visit: Payer: Self-pay

## 2020-06-08 ENCOUNTER — Encounter: Payer: Self-pay | Admitting: Family Medicine

## 2020-06-08 VITALS — BP 124/68 | HR 59 | Temp 98.2°F | Wt 170.0 lb

## 2020-06-08 DIAGNOSIS — I10 Essential (primary) hypertension: Secondary | ICD-10-CM

## 2020-06-08 DIAGNOSIS — E039 Hypothyroidism, unspecified: Secondary | ICD-10-CM

## 2020-06-08 DIAGNOSIS — I25111 Atherosclerotic heart disease of native coronary artery with angina pectoris with documented spasm: Secondary | ICD-10-CM | POA: Diagnosis not present

## 2020-06-08 DIAGNOSIS — E1169 Type 2 diabetes mellitus with other specified complication: Secondary | ICD-10-CM

## 2020-06-08 DIAGNOSIS — E119 Type 2 diabetes mellitus without complications: Secondary | ICD-10-CM

## 2020-06-08 DIAGNOSIS — D692 Other nonthrombocytopenic purpura: Secondary | ICD-10-CM

## 2020-06-08 DIAGNOSIS — R413 Other amnesia: Secondary | ICD-10-CM

## 2020-06-08 DIAGNOSIS — F339 Major depressive disorder, recurrent, unspecified: Secondary | ICD-10-CM

## 2020-06-08 DIAGNOSIS — N400 Enlarged prostate without lower urinary tract symptoms: Secondary | ICD-10-CM

## 2020-06-08 DIAGNOSIS — I495 Sick sinus syndrome: Secondary | ICD-10-CM

## 2020-06-08 DIAGNOSIS — E785 Hyperlipidemia, unspecified: Secondary | ICD-10-CM

## 2020-06-08 DIAGNOSIS — F3342 Major depressive disorder, recurrent, in full remission: Secondary | ICD-10-CM

## 2020-06-08 MED ORDER — CLOPIDOGREL BISULFATE 75 MG PO TABS: 75.0000 mg | ORAL_TABLET | Freq: Every day | ORAL | 1 refills | Status: AC

## 2020-06-08 MED ORDER — METFORMIN HCL 500 MG PO TABS: 1000.0000 mg | ORAL_TABLET | Freq: Two times a day (BID) | ORAL | 1 refills | Status: AC

## 2020-06-08 MED ORDER — FLUTICASONE PROPIONATE 50 MCG/ACT NA SUSP: 2.0000 | Freq: Two times a day (BID) | NASAL | 6 refills | Status: DC

## 2020-06-08 MED ORDER — PIOGLITAZONE HCL 45 MG PO TABS: 45.0000 mg | ORAL_TABLET | Freq: Every day | ORAL | 1 refills | Status: AC

## 2020-06-08 MED ORDER — ATORVASTATIN CALCIUM 40 MG PO TABS: 40.0000 mg | ORAL_TABLET | Freq: Every day | ORAL | 1 refills | Status: AC

## 2020-06-08 MED ORDER — LEVOTHYROXINE SODIUM 75 MCG PO TABS: ORAL_TABLET | ORAL | 1 refills | Status: AC

## 2020-06-08 MED ORDER — DULOXETINE HCL 30 MG PO CPEP: 30.0000 mg | ORAL_CAPSULE | Freq: Every day | ORAL | 1 refills | Status: AC

## 2020-06-08 MED ORDER — AMLODIPINE BESYLATE 5 MG PO TABS: 5.0000 mg | ORAL_TABLET | Freq: Every day | ORAL | 1 refills | Status: AC

## 2020-06-08 MED ORDER — PANTOPRAZOLE SODIUM 20 MG PO TBEC: 20.0000 mg | DELAYED_RELEASE_TABLET | Freq: Every day | ORAL | 1 refills | Status: AC

## 2020-06-08 MED ORDER — ENALAPRIL MALEATE 20 MG PO TABS: 20.0000 mg | ORAL_TABLET | Freq: Every day | ORAL | 1 refills | Status: AC

## 2020-06-08 NOTE — Assessment & Plan Note (Signed)
Stable, exacerbated by plavix. continue to monitor

## 2020-06-08 NOTE — Assessment & Plan Note (Signed)
Stable, declines any interventions at this time. Managing well at home per wife who is his caregiver

## 2020-06-08 NOTE — Assessment & Plan Note (Signed)
Recheck A1C, adjust as needed. Continue current medications and lifestyle modifications in meantime

## 2020-06-08 NOTE — Assessment & Plan Note (Signed)
S/p pacemaker placement, followed closely by Electrophysiology. Continue per their recommendations

## 2020-06-08 NOTE — Assessment & Plan Note (Signed)
Stable and well controlled, continue current medications and lifestyle modifications °

## 2020-06-08 NOTE — Assessment & Plan Note (Signed)
Stable and well controlled on synthroid, recheck tsh and adjust as needed

## 2020-06-08 NOTE — Assessment & Plan Note (Signed)
Followed by Urology, continue current regimen per their recommendations

## 2020-06-08 NOTE — Assessment & Plan Note (Signed)
Stable and well controlled with cymbalta, continue current regimen

## 2020-06-08 NOTE — Assessment & Plan Note (Signed)
Stable and well controlled, continue cymbalta regimen

## 2020-06-08 NOTE — Progress Notes (Signed)
BP 124/68   Pulse (!) 59   Temp 98.2 F (36.8 C) (Oral)   Wt 170 lb (77.1 kg)   SpO2 98%   BMI 26.00 kg/m    Subjective:    Patient ID: Paul Parker, male    DOB: 04-22-1941, 79 y.o.   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 Diabetes  . Hypertension  . Hyperlipidemia  . Hypothyroidism   Here today for 6 month f/u chronic conditions.   HTN - Taking medications faithfully without side effects. Denies CP, SOB, HAs, dizziness. Trying to watch diet, low sodium and stay active as tolerated. Checks home BPs occasionally and getting about 120s/60s range.   Sick sinus syndrome, s/p pacemaker placement - following closely with Electrophysiologist and Cardiology. No recent issues. Taking medications faithfully.   DM - Taking medications faithfully, no low blood sugar spells, polyuria, polyphagia, polydipsia. Trying to watch diet. Does not regularly check BSs at home.   HLD - on lipitor, no myalgias, claudication, CP, SOB.   Hypothyroid - On synthroid, no new sxs or concerns. Tolerating well.   BPH - on finasteride, followed by Urology. No new sxs  Memory decline - stable since last visit. Wife helping with care needs around the home but overall still able to perform ADLs for himself. No additional concerns.   Depression - on cymbalta which controls moods well. Denies SI/HI.   Depression screen [facility] 2/9 06/08/2020 12/02/2019 08/13/2018  Decreased Interest 0 0 0  Down, Depressed, Hopeless 0 0 0  PHQ - 2 Score 0 0 0  Altered sleeping 0 1 1  Tired, decreased energy 0 0 0  Change in appetite 0 0 0  Feeling bad or failure about yourself  0 0 0  Trouble concentrating 0 0 0  Moving slowly or fidgety/restless 0 1 0  Suicidal thoughts 0 0 0  PHQ-9 Score 0 2 1  Difficult doing work/chores - - Not difficult at all   GAD 7 : Generalized Anxiety Score 12/02/2019  Nervous, Anxious, on Edge 0  Control/stop worrying 0  Worry too much -  different things 0  Trouble relaxing 0  Restless 0  Easily annoyed or irritable 0  Afraid - awful might happen 0  Total GAD 7 Score 0   Relevant past medical, surgical, family and social history reviewed and updated as indicated. Interim medical history since our last visit reviewed. Allergies and medications reviewed and updated.  Review of Systems  Per HPI unless specifically indicated above     Objective:    BP 124/68   Pulse (!) 59   Temp 98.2 F (36.8 C) (Oral)   Wt 170 lb (77.1 kg)   SpO2 98%   BMI 26.00 kg/m   Wt Readings from Last 3 Encounters:  06/08/20 170 lb (77.1 kg)  12/02/19 177 lb (80.3 kg)  10/28/19 174 lb (78.9 kg)    Physical Exam Vitals and nursing note reviewed.  Constitutional:      Appearance: Normal appearance.  HENT:     Head: Atraumatic.  Eyes:     Extraocular Movements: Extraocular movements intact.     Conjunctiva/sclera: Conjunctivae normal.  Cardiovascular:     Rate and Rhythm: Normal rate.     Pulses: Normal pulses.  Pulmonary:     Effort: Pulmonary effort is normal.     Breath sounds: Normal breath sounds.  Abdominal:     General: Bowel sounds  are normal.     Palpations: Abdomen is soft.     Tenderness: There is no abdominal tenderness.  Genitourinary:    Comments: GU exam done through Urology Musculoskeletal:        General: Normal range of motion.     Cervical back: Normal range of motion and neck supple.  Skin:    General: Skin is warm and dry.  Neurological:     General: No focal deficit present.     Mental Status: Mental status is at baseline.  Psychiatric:        Mood and Affect: Mood normal.        Thought Content: Thought content normal.        Judgment: Judgment normal.    Results for orders placed or performed in visit on 12/02/19  CBC with Differential/Platelet out  Result Value Ref Range   WBC 6.5 3.4 - 10.8 x10E3/uL   RBC 3.83 (L) 4.14 - 5.80 x10E6/uL   Hemoglobin 11.8 (L) 13.0 - 17.7 g/dL   Hematocrit  36.7 (L) 37.5 - 51.0 %   MCV 96 79 - 97 fL   MCH 30.8 26.6 - 33.0 pg   MCHC 32.2 31 - 35 g/dL   RDW 13.2 11.6 - 15.4 %   Platelets 152 150 - 450 x10E3/uL   Neutrophils 66 Not Estab. %   Lymphs 24 Not Estab. %   Monocytes 8 Not Estab. %   Eos 1 Not Estab. %   Basos 0 Not Estab. %   Neutrophils Absolute 4.3 1 - 7 x10E3/uL   Lymphocytes Absolute 1.6 0 - 3 x10E3/uL   Monocytes Absolute 0.5 0 - 0 x10E3/uL   EOS (ABSOLUTE) 0.1 0.0 - 0.4 x10E3/uL   Basophils Absolute 0.0 0 - 0 x10E3/uL   Immature Granulocytes 1 Not Estab. %   Immature Grans (Abs) 0.0 0.0 - 0.1 x10E3/uL  Comprehensive metabolic panel  Result Value Ref Range   Glucose 162 (H) 65 - 99 mg/dL   BUN 19 8 - 27 mg/dL   Creatinine, Ser 0.91 0.76 - 1.27 mg/dL   GFR calc non Af Amer 80 >59 mL/min/1.73   GFR calc Af Amer 93 >59 mL/min/1.73   BUN/Creatinine Ratio 21 10 - 24   Sodium 141 134 - 144 mmol/L   Potassium 4.3 3.5 - 5.2 mmol/L   Chloride 102 96 - 106 mmol/L   CO2 24 20 - 29 mmol/L   Calcium 9.3 8.6 - 10.2 mg/dL   Total Protein 7.2 6.0 - 8.5 g/dL   Albumin 4.6 3.7 - 4.7 g/dL   Globulin, Total 2.6 1.5 - 4.5 g/dL   Albumin/Globulin Ratio 1.8 1.2 - 2.2   Bilirubin Total 0.4 0.0 - 1.2 mg/dL   Alkaline Phosphatase 58 39 - 117 IU/L   AST 24 0 - 40 IU/L   ALT 18 0 - 44 IU/L  Lipid Panel w/o Chol/HDL Ratio out  Result Value Ref Range   Cholesterol, Total 137 100 - 199 mg/dL   Triglycerides 80 0 - 149 mg/dL   HDL 62 >39 mg/dL   VLDL Cholesterol Cal 16 5 - 40 mg/dL   LDL Chol Calc (NIH) 59 0 - 99 mg/dL  TSH  Result Value Ref Range   TSH 4.470 0.450 - 4.500 uIU/mL  UA/M w/rflx Culture, Routine   Specimen: Urine   URINE  Result Value Ref Range   Specific Gravity, UA 1.015 1.005 - 1.030   pH, UA 5.5 5.0 - 7.5  Color, UA Yellow Yellow   Appearance Ur Clear Clear   Leukocytes,UA Negative Negative   Protein,UA Negative Negative/Trace   Glucose, UA 3+ (A) Negative   Ketones, UA Negative Negative   RBC, UA Negative  Negative   Bilirubin, UA Negative Negative   Urobilinogen, Ur 0.2 0.2 - 1.0 mg/dL   Nitrite, UA Negative Negative  HgB A1c  Result Value Ref Range   Hgb A1c MFr Bld 7.1 (H) 4.8 - 5.6 %   Est. average glucose Bld gHb Est-mCnc 157 mg/dL      Assessment & Plan:   Problem List Items Addressed This Visit      Cardiovascular and Mediastinum   Hypertension - Primary    BPs stable and under good control, continue present medications and DASH diet. Exercise as tolerated.       Relevant Medications   enalapril (VASOTEC) 20 MG tablet   atorvastatin (LIPITOR) 40 MG tablet   amLODipine (NORVASC) 5 MG tablet   Senile purpura (HCC)    Stable, exacerbated by plavix. continue to monitor      Relevant Medications   enalapril (VASOTEC) 20 MG tablet   atorvastatin (LIPITOR) 40 MG tablet   amLODipine (NORVASC) 5 MG tablet   CAD (coronary artery disease)    Stable and well controlled, continue current medications and lifestyle modifications      Relevant Medications   enalapril (VASOTEC) 20 MG tablet   clopidogrel (PLAVIX) 75 MG tablet   atorvastatin (LIPITOR) 40 MG tablet   amLODipine (NORVASC) 5 MG tablet   Other Relevant Orders   Lipid Panel w/o Chol/HDL Ratio   SSS (sick sinus syndrome) ([place])    S/p pacemaker placement, followed closely by Electrophysiology. Continue per their recommendations      Relevant Medications   enalapril (VASOTEC) 20 MG tablet   atorvastatin (LIPITOR) 40 MG tablet   amLODipine (NORVASC) 5 MG tablet     Endocrine   Diabetes mellitus associated with hormonal etiology ([place])    Recheck A1C, adjust as needed. Continue current medications and lifestyle modifications in meantime      Relevant Medications   pioglitazone (ACTOS) 45 MG tablet   metFORMIN (GLUCOPHAGE) 500 MG tablet   enalapril (VASOTEC) 20 MG tablet   atorvastatin (LIPITOR) 40 MG tablet   Other Relevant Orders   Comprehensive metabolic panel   HgB Z6X   Hypothyroidism    Stable and well  controlled on synthroid, recheck tsh and adjust as needed      Relevant Medications   levothyroxine (EUTHYROX) 75 MCG tablet   Other Relevant Orders   TSH     Genitourinary   BPH (benign prostatic hyperplasia)    Followed by Urology, continue current regimen per their recommendations        Other   Hyperlipidemia    Stable, recheck lipids and adjust lipitor dose if needed. Continue lifestyle modifications      Relevant Medications   enalapril (VASOTEC) 20 MG tablet   atorvastatin (LIPITOR) 40 MG tablet   amLODipine (NORVASC) 5 MG tablet   Depression, recurrent (HCC)    Stable and well controlled with cymbalta, continue current regimen      Relevant Medications   DULoxetine (CYMBALTA) 30 MG capsule   Recurrent major depressive disorder, in full remission ([place])    Stable and well controlled, continue cymbalta regimen      Relevant Medications   DULoxetine (CYMBALTA) 30 MG capsule   Memory changes    Stable, declines any interventions at this  time. Managing well at home per wife who is his caregiver       Other Visit Diagnoses    Diabetes mellitus without complication ([place])       Relevant Medications   pioglitazone (ACTOS) 45 MG tablet   metFORMIN (GLUCOPHAGE) 500 MG tablet   enalapril (VASOTEC) 20 MG tablet   atorvastatin (LIPITOR) 40 MG tablet       Follow up plan: Return in about 6 months (around 12/09/2020) for CPE.

## 2020-06-08 NOTE — Assessment & Plan Note (Signed)
BPs stable and under good control, continue present medications and DASH diet. Exercise as tolerated.

## 2020-06-08 NOTE — Assessment & Plan Note (Signed)
Stable, recheck lipids and adjust lipitor dose if needed. Continue lifestyle modifications

## 2020-06-09 ENCOUNTER — Encounter: Payer: Self-pay | Admitting: Family Medicine

## 2020-06-09 LAB — COMPREHENSIVE METABOLIC PANEL
ALT: 19 IU/L (ref 0–44)
AST: 22 IU/L (ref 0–40)
Albumin/Globulin Ratio: 2 (ref 1.2–2.2)
Albumin: 4.8 g/dL — ABNORMAL HIGH (ref 3.7–4.7)
Alkaline Phosphatase: 62 IU/L (ref 48–121)
BUN/Creatinine Ratio: 20 (ref 10–24)
BUN: 22 mg/dL (ref 8–27)
Bilirubin Total: 0.4 mg/dL (ref 0.0–1.2)
CO2: 24 mmol/L (ref 20–29)
Calcium: 9.6 mg/dL (ref 8.6–10.2)
Chloride: 101 mmol/L (ref 96–106)
Creatinine, Ser: 1.09 mg/dL (ref 0.76–1.27)
GFR calc Af Amer: 74 mL/min/{1.73_m2} (ref >59)
GFR calc non Af Amer: 64 mL/min/{1.73_m2} (ref >59)
Globulin, Total: 2.4 g/dL (ref 1.5–4.5)
Glucose: 131 mg/dL — ABNORMAL HIGH (ref 65–99)
Potassium: 5 mmol/L (ref 3.5–5.2)
Sodium: 139 mmol/L (ref 134–144)
Total Protein: 7.2 g/dL (ref 6.0–8.5)

## 2020-06-09 LAB — LIPID PANEL W/O CHOL/HDL RATIO
Cholesterol, Total: 142 mg/dL (ref 100–199)
HDL: 53 mg/dL (ref >39)
LDL Chol Calc (NIH): 68 mg/dL (ref 0–99)
Triglycerides: 118 mg/dL (ref 0–149)
VLDL Cholesterol Cal: 21 mg/dL (ref 5–40)

## 2020-06-09 LAB — HEMOGLOBIN A1C
Est. average glucose Bld gHb Est-mCnc: 160 mg/dL
Hgb A1c MFr Bld: 7.2 % — ABNORMAL HIGH (ref 4.8–5.6)

## 2020-06-09 LAB — TSH: TSH: 2.81 u[IU]/mL (ref 0.450–4.500)

## 2020-06-12 ENCOUNTER — Other Ambulatory Visit: Payer: Self-pay | Admitting: Family Medicine

## 2020-06-12 DIAGNOSIS — I10 Essential (primary) hypertension: Secondary | ICD-10-CM

## 2020-06-12 DIAGNOSIS — E785 Hyperlipidemia, unspecified: Secondary | ICD-10-CM

## 2020-06-12 NOTE — Telephone Encounter (Signed)
Ashley called and spoke to [place] about the refills requested. She says they have them available for pickup and she will contact the patient.

## 2020-06-19 ENCOUNTER — Other Ambulatory Visit: Payer: Self-pay | Admitting: Family Medicine

## 2020-06-19 DIAGNOSIS — I10 Essential (primary) hypertension: Secondary | ICD-10-CM

## 2020-06-29 ENCOUNTER — Ambulatory Visit (INDEPENDENT_AMBULATORY_CARE_PROVIDER_SITE_OTHER): Payer: Medicare Other | Admitting: Internal Medicine

## 2020-06-29 ENCOUNTER — Other Ambulatory Visit: Payer: Self-pay

## 2020-06-29 VITALS — BP 128/60 | HR 76

## 2020-06-29 DIAGNOSIS — I25111 Atherosclerotic heart disease of native coronary artery with angina pectoris with documented spasm: Secondary | ICD-10-CM | POA: Diagnosis not present

## 2020-06-29 DIAGNOSIS — R413 Other amnesia: Secondary | ICD-10-CM | POA: Diagnosis not present

## 2020-06-29 DIAGNOSIS — Z950 Presence of cardiac pacemaker: Secondary | ICD-10-CM | POA: Diagnosis not present

## 2020-06-29 DIAGNOSIS — D692 Other nonthrombocytopenic purpura: Secondary | ICD-10-CM

## 2020-06-29 LAB — CBC WITH DIFFERENTIAL/PLATELET
Absolute Monocytes: 669 cells/uL (ref 200–950)
Basophils Absolute: 21 cells/uL (ref 0–200)
Basophils Relative: 0.3 %
Eosinophils Absolute: 110 cells/uL (ref 15–500)
Eosinophils Relative: 1.6 %
HCT: 36.5 % — ABNORMAL LOW (ref 38.5–50.0)
Hemoglobin: 12 g/dL — ABNORMAL LOW (ref 13.2–17.1)
Lymphs Abs: 1532 cells/uL (ref 850–3900)
MCH: 31.9 pg (ref 27.0–33.0)
MCHC: 32.9 g/dL (ref 32.0–36.0)
MCV: 97.1 fL (ref 80.0–100.0)
MPV: 11.6 fL (ref 7.5–12.5)
Monocytes Relative: 9.7 %
Neutro Abs: 4568 cells/uL (ref 1500–7800)
Neutrophils Relative %: 66.2 %
Platelets: 137 10*3/uL — ABNORMAL LOW (ref 140–400)
RBC: 3.76 10*6/uL — ABNORMAL LOW (ref 4.20–5.80)
RDW: 12.8 % (ref 11.0–15.0)
Total Lymphocyte: 22.2 %
WBC: 6.9 10*3/uL (ref 3.8–10.8)

## 2020-06-29 LAB — POTASSIUM: Potassium: 4.6 mmol/L (ref 3.5–5.3)

## 2020-06-29 NOTE — Progress Notes (Signed)
Established Patient Office Visit  SUBJECTIVE:  Subjective  Patient ID: Paul Parker, male    DOB: [birth year]  Age: 79 y.o. MRN: [id number]  CC:  Chief Complaint  Patient presents with  . Pacemaker Check    HPI Paul Parker is a 79 y.o. male presenting today for a pacemaker check.  Patient pacemaker was interrogated by pacemakers analyzer, battery status is okay.  No programming changes were indicated after the review of the data.  Histogram shows no change since the last interrogation Atrial and ventricular sensing thresholds were found to be acceptable Impedance was checked and it was found to be normal.  Thresholds were found to be okay on evaluation of rhythm problem.  Atrial arrhythmia was noted.  Estimated battery longevity is 28 months. I have personally reviewed the device data and amended the report as necessary.    Past Medical History:  Diagnosis Date  . CAD (coronary artery disease)   . Diabetes mellitus without complication ([place])   . Heart murmur   . Hyperlipidemia   . Hypertension   . Prostate cancer [facility])     Past Surgical History:  Procedure Laterality Date  . CORONARY ARTERY BYPASS GRAFT    . CORONARY STENT PLACEMENT    . HERNIA REPAIR    . PACEMAKER PLACEMENT      Family History  Problem Relation Age of Onset  . Heart disease Mother   . Heart disease Father     Social History   Socioeconomic History  . Marital status: Married    Spouse name: Not on file  . Number of children: Not on file  . Years of education: Not on file  . Highest education level: Not on file  Occupational History  . Not on file  Tobacco Use  . Smoking status: Never Smoker  . Smokeless tobacco: Never Used  Substance and Sexual Activity  . Alcohol use: No  . Drug use: No  . Sexual activity: Not on file  Other Topics Concern  . Not on file  Social History Narrative  . Not on file   Social Determinants of Health   Financial Resource Strain:   . Difficulty of Paying Living  Expenses:   Food Insecurity:   . Worried About Charity fundraiser in the Last Year:   . Arboriculturist in the Last Year:   Transportation Needs:   . Film/video editor (Medical):   Marland Kitchen Lack of Transportation (Non-Medical):   Physical Activity:   . Days of Exercise per Week:   . Minutes of Exercise per Session:   Stress:   . Feeling of Stress :   Social Connections:   . Frequency of Communication with Friends and Family:   . Frequency of Social Gatherings with Friends and Family:   . Attends Religious Services:   . Active Member of Clubs or Organizations:   . Attends Archivist Meetings:   Marland Kitchen Marital Status:   Intimate Partner Violence:   . Fear of Current or Ex-Partner:   . Emotionally Abused:   Marland Kitchen Physically Abused:   . Sexually Abused:      Current Outpatient Medications:  .  amiodarone (PACERONE) 200 MG tablet, Take 100 mg by mouth daily. , Disp: , Rfl:  .  amLODipine (NORVASC) 5 MG tablet, Take 1 tablet (5 mg total) by mouth daily., Disp: 90 tablet, Rfl: 1 .  aspirin EC 81 MG tablet, Take 81 mg by mouth daily., Disp: , Rfl:  .  atorvastatin (LIPITOR) 40 MG tablet, Take 1 tablet (40 mg total) by mouth daily., Disp: 90 tablet, Rfl: 1 .  clopidogrel (PLAVIX) 75 MG tablet, Take 1 tablet (75 mg total) by mouth daily., Disp: 90 tablet, Rfl: 1 .  Docusate Calcium (STOOL SOFTENER PO), Take by mouth daily., Disp: , Rfl:  .  DULoxetine (CYMBALTA) 30 MG capsule, Take 1 capsule (30 mg total) by mouth daily., Disp: 90 capsule, Rfl: 1 .  enalapril (VASOTEC) 20 MG tablet, Take 1 tablet (20 mg total) by mouth daily., Disp: 90 tablet, Rfl: 1 .  ferrous sulfate 325 (65 FE) MG EC tablet, Take 1 tablet by mouth once daily with breakfast, Disp: 90 tablet, Rfl: 1 .  finasteride (PROSCAR) 5 MG tablet, Take 1 tablet by mouth once daily, Disp: 90 tablet, Rfl: 0 .  furosemide (LASIX) 20 MG tablet, Take 20 mg by mouth daily., Disp: , Rfl:  .  glucose blood (ONETOUCH ULTRA) test strip, 1  each by Other route daily as needed for other. Use as instructed, Disp: 100 each, Rfl: 0 .  isosorbide mononitrate (IMDUR) 60 MG 24 hr tablet, Take 60 mg by mouth daily., Disp: , Rfl:  .  levothyroxine (EUTHYROX) 75 MCG tablet, TAKE 1 TABLET BY MOUTH ONCE DAILY IN THE MORNING BEFORE BREAKFAST, Disp: 90 tablet, Rfl: 1 .  metFORMIN (GLUCOPHAGE) 500 MG tablet, Take 2 tablets (1,000 mg total) by mouth 2 (two) times daily with a meal., Disp: 360 tablet, Rfl: 1 .  ONETOUCH DELICA LANCETS 16B MISC, USE TO CHECK BLOOD SUGAR ONCE A DAY, Disp: 100 each, Rfl: 12 .  pantoprazole (PROTONIX) 20 MG tablet, Take 1 tablet (20 mg total) by mouth daily., Disp: 90 tablet, Rfl: 1 .  pioglitazone (ACTOS) 45 MG tablet, Take 1 tablet (45 mg total) by mouth daily., Disp: 90 tablet, Rfl: 1   Allergies  Allergen Reactions  . Amoxicillin Diarrhea and Nausea And Vomiting    ROS Review of Systems   OBJECTIVE:    Physical Exam  BP 128/60   Pulse 76  Wt Readings from Last 3 Encounters:  07/06/20 172 lb 4.8 oz (78.2 kg)  06/08/20 170 lb (77.1 kg)  12/02/19 177 lb (80.3 kg)    Health Maintenance Due  Topic Date Due  . COVID-19 Vaccine (1) Never done  . OPHTHALMOLOGY EXAM  12/05/2016  . INFLUENZA VACCINE  06/25/2020    There are no preventive care reminders to display for this patient.  CBC Latest Ref Rng & Units 06/29/2020 12/02/2019 08/07/2019  WBC 3.8 - 10.8 Thousand/uL 6.9 6.5 8.4  Hemoglobin 13.2 - 17.1 g/dL 12.0(L) 11.8(L) 11.2(L)  Hematocrit 38 - 50 % 36.5(L) 36.7(L) 35.1(L)  Platelets 140 - 400 Thousand/uL 137(L) 152 149(L)   CMP Latest Ref Rng & Units 06/29/2020 06/08/2020 12/02/2019  Glucose 65 - 99 mg/dL - 131(H) 162(H)  BUN 8 - 27 mg/dL - 22 19  Creatinine 0.76 - 1.27 mg/dL - 1.09 0.91  Sodium 134 - 144 mmol/L - 139 141  Potassium 3.5 - 5.3 mmol/L 4.6 5.0 4.3  Chloride 96 - 106 mmol/L - 101 102  CO2 20 - 29 mmol/L - 24 24  Calcium 8.6 - 10.2 mg/dL - 9.6 9.3  Total Protein 6.0 - 8.5 g/dL - 7.2 7.2    Total Bilirubin 0.0 - 1.2 mg/dL - 0.4 0.4  Alkaline Phos 48 - 121 IU/L - 62 58  AST 0 - 40 IU/L - 22 24  ALT 0 - 44 IU/L -  19 18    Lab Results  Component Value Date   TSH 2.810 06/08/2020   Lab Results  Component Value Date   ALBUMIN 4.8 (H) 06/08/2020   ANIONGAP 13 08/07/2019   Lab Results  Component Value Date   CHOL 142 06/08/2020   CHOL 137 12/02/2019   CHOL 126 06/01/2019   HDL 53 06/08/2020   HDL 62 12/02/2019   HDL 62 11/04/2018   LDLCALC 68 06/08/2020   LDLCALC 59 12/02/2019   LDLCALC 61 11/04/2018   CHOLHDL 2.2 11/04/2018   CHOLHDL 2.1 10/28/2017   CHOLHDL 2.3 08/30/2015   Lab Results  Component Value Date   TRIG 118 06/08/2020   Lab Results  Component Value Date   HGBA1C 7.2 (H) 06/08/2020   HGBA1C 7.1 (H) 12/02/2019   HGBA1C 7.2 (H) 06/01/2019      ASSESSMENT & PLAN:   Problem List Items Addressed This Visit      Cardiovascular and Mediastinum   Senile purpura (HCC)    Recent CBC shows that his platelet count was 137k      CAD (coronary artery disease)    Denies chest pain and SOB. Chest is clear.         Other   Memory changes    Patient has the beginnings of alzhimers disease. Refer to PCP      Cardiac pacemaker in situ - Primary    Pacemaker is working well. Please see report.       Relevant Orders   PACEMAKER IN CLINIC CHECK (Completed)   CBC with Differential/Platelet (Completed)   Potassium (Completed)      No orders of the defined types were placed in this encounter.   Follow-up: No follow-ups on file.    Dr. Jane Canary [facility] [address] [place], [place] [address]   By signing my name below, I, [employer], attest that this documentation has been prepared under the direction and in the presence of Cletis Athens, MD. Electronically Signed: Cletis Athens, MD 07/06/20, 12:45 PM   I personally performed the services described in this documentation, which was SCRIBED in my presence. The  recorded information has been reviewed and considered accurate. It has been edited as necessary during review. Cletis Athens, MD

## 2020-07-03 ENCOUNTER — Other Ambulatory Visit: Payer: Self-pay | Admitting: Nurse Practitioner

## 2020-07-03 ENCOUNTER — Other Ambulatory Visit: Payer: Self-pay | Admitting: Family Medicine

## 2020-07-03 MED ORDER — FERROUS SULFATE 325 (65 FE) MG PO TBEC: DELAYED_RELEASE_TABLET | ORAL | 1 refills | Status: AC

## 2020-07-03 NOTE — Telephone Encounter (Signed)
Routing to provider  

## 2020-07-03 NOTE — Telephone Encounter (Signed)
Requested medication (s) are due for refill today:   Yes  Requested medication (s) are on the active medication list:   Yes  Future visit scheduled:   Yes with Merrie Roof on 12/19/2020   Last ordered: 01/04/2020 #90, RF 1  Failed protocol due to lab work needed.   Requested Prescriptions  Pending Prescriptions Disp Refills   ferrous sulfate 325 (65 FE) MG EC tablet [Pharmacy Med Name: Ferrous Sulfate 325 (65 Fe) MG Oral Tablet Delayed Release] 90 tablet 0    Sig: Take 1 tablet by mouth once daily with breakfast      Endocrinology:  Minerals - Iron Supplementation Failed - 07/03/2020 11:19 AM      Failed - HGB in normal range and within 360 days    Hemoglobin  Date Value Ref Range Status  06/29/2020 12.0 (L) 13.2 - 17.1 g/dL Final  12/02/2019 11.8 (L) 13.0 - 17.7 g/dL Final          Failed - HCT in normal range and within 360 days    HCT  Date Value Ref Range Status  06/29/2020 36.5 (L) 38 - 50 % Final   Hematocrit  Date Value Ref Range Status  12/02/2019 36.7 (L) 37.5 - 51.0 % Final          Failed - RBC in normal range and within 360 days    RBC  Date Value Ref Range Status  06/29/2020 3.76 (L) 4.20 - 5.80 Million/uL Final          Failed - Fe (serum) in normal range and within 360 days    Iron  Date Value Ref Range Status  09/30/2018 59 38 - 169 ug/dL Final   Iron Saturation  Date Value Ref Range Status  09/30/2018 15 15 - 55 % Final          Failed - Ferritin in normal range and within 360 days    Ferritin  Date Value Ref Range Status  09/30/2018 23 (L) 30.0 - 400.0 ng/mL Final          Passed - Valid encounter within last 12 months    Recent Outpatient Visits           3 weeks ago Essential hypertension   [facility] Volney American, [place]   7 months ago Annual physical exam   [facility] Volney American, PA-C   1 year ago Coronary artery disease involving native coronary artery of native heart with angina  pectoris with documented spasm [facility])   [facility] [place], Jeannette How, MD   1 year ago Upper respiratory tract infection, unspecified type   [facility], [place], [place]   1 year ago Coronary artery disease involving native coronary artery of native heart with angina pectoris with documented spasm [facility])   [place] Cr[place], Jeannette How, MD       Future Appointments             In 3 days Cletis Athens, MD [facility]   In 3 months [place], [facility] Payment [place]   In 5 months Orene Desanctis, Lilia Argue, [place], [facility]

## 2020-07-05 ENCOUNTER — Other Ambulatory Visit: Payer: Self-pay | Admitting: Family Medicine

## 2020-07-06 ENCOUNTER — Ambulatory Visit (INDEPENDENT_AMBULATORY_CARE_PROVIDER_SITE_OTHER): Payer: Medicare Other | Admitting: Internal Medicine

## 2020-07-06 ENCOUNTER — Encounter: Payer: Self-pay | Admitting: Internal Medicine

## 2020-07-06 ENCOUNTER — Other Ambulatory Visit: Payer: Self-pay

## 2020-07-06 VITALS — BP 111/61 | HR 62 | Wt 172.3 lb

## 2020-07-06 DIAGNOSIS — D649 Anemia, unspecified: Secondary | ICD-10-CM

## 2020-07-06 DIAGNOSIS — D692 Other nonthrombocytopenic purpura: Secondary | ICD-10-CM | POA: Diagnosis not present

## 2020-07-06 DIAGNOSIS — I495 Sick sinus syndrome: Secondary | ICD-10-CM

## 2020-07-06 DIAGNOSIS — Z950 Presence of cardiac pacemaker: Secondary | ICD-10-CM

## 2020-07-06 DIAGNOSIS — I25111 Atherosclerotic heart disease of native coronary artery with angina pectoris with documented spasm: Secondary | ICD-10-CM

## 2020-07-06 DIAGNOSIS — E039 Hypothyroidism, unspecified: Secondary | ICD-10-CM

## 2020-07-06 DIAGNOSIS — S060X9S Concussion with loss of consciousness of unspecified duration, sequela: Secondary | ICD-10-CM

## 2020-07-06 NOTE — Assessment & Plan Note (Signed)
Patient has the beginnings of alzhimers disease. Refer to PCP

## 2020-07-06 NOTE — Progress Notes (Signed)
Established Patient Office Visit  SUBJECTIVE:  Subjective  Patient ID: Paul Parker, male    DOB: 02-Apr-1941  Age: 79 y.o. MRN: [id number]  CC:  Chief Complaint  Patient presents with  . Follow-up    1 week pacemaker follow up and lab results review    HPI Paul Parker is a 79 y.o. male presenting today for a follow up to his 1 week pacemaker check and review of lab work  He declines any issues. He is currently on Aspirin and Plavix to thin his blood. We had a discussion to place him on Eliquis, but there are concerns about it being too expensive and he has problem with anemia, thrombocytopenia, unsteady gait. His pacemaker is ok.   Past Medical History:  Diagnosis Date  . CAD (coronary artery disease)   . Diabetes mellitus without complication ([place])   . Heart murmur   . Hyperlipidemia   . Hypertension   . Prostate cancer [facility])     Past Surgical History:  Procedure Laterality Date  . CORONARY ARTERY BYPASS GRAFT    . CORONARY STENT PLACEMENT    . HERNIA REPAIR    . PACEMAKER PLACEMENT      Family History  Problem Relation Age of Onset  . Heart disease Mother   . Heart disease Father     Social History   Socioeconomic History  . Marital status: Married    Spouse name: Not on file  . Number of children: Not on file  . Years of education: Not on file  . Highest education level: Not on file  Occupational History  . Not on file  Tobacco Use  . Smoking status: Never Smoker  . Smokeless tobacco: Never Used  Substance and Sexual Activity  . Alcohol use: No  . Drug use: No  . Sexual activity: Not on file  Other Topics Concern  . Not on file  Social History Narrative  . Not on file   Social Determinants of Health   Financial Resource Strain:   . Difficulty of Paying Living Expenses:   Food Insecurity:   . Worried About Charity fundraiser in the Last Year:   . Arboriculturist in the Last Year:   Transportation Needs:   . Film/video editor (Medical):   Marland Kitchen  Lack of Transportation (Non-Medical):   Physical Activity:   . Days of Exercise per Week:   . Minutes of Exercise per Session:   Stress:   . Feeling of Stress :   Social Connections:   . Frequency of Communication with Friends and Family:   . Frequency of Social Gatherings with Friends and Family:   . Attends Religious Services:   . Active Member of Clubs or Organizations:   . Attends Archivist Meetings:   Marland Kitchen Marital Status:   Intimate Partner Violence:   . Fear of Current or Ex-Partner:   . Emotionally Abused:   Marland Kitchen Physically Abused:   . Sexually Abused:      Current Outpatient Medications:  .  amiodarone (PACERONE) 200 MG tablet, Take 100 mg by mouth daily. , Disp: , Rfl:  .  amLODipine (NORVASC) 5 MG tablet, Take 1 tablet (5 mg total) by mouth daily., Disp: 90 tablet, Rfl: 1 .  aspirin EC 81 MG tablet, Take 81 mg by mouth daily., Disp: , Rfl:  .  atorvastatin (LIPITOR) 40 MG tablet, Take 1 tablet (40 mg total) by mouth daily., Disp: 90 tablet, Rfl: 1 .  clopidogrel (PLAVIX) 75 MG tablet, Take 1 tablet (75 mg total) by mouth daily., Disp: 90 tablet, Rfl: 1 .  Docusate Calcium (STOOL SOFTENER PO), Take by mouth daily., Disp: , Rfl:  .  DULoxetine (CYMBALTA) 30 MG capsule, Take 1 capsule (30 mg total) by mouth daily., Disp: 90 capsule, Rfl: 1 .  enalapril (VASOTEC) 20 MG tablet, Take 1 tablet (20 mg total) by mouth daily., Disp: 90 tablet, Rfl: 1 .  ferrous sulfate 325 (65 FE) MG EC tablet, Take 1 tablet by mouth once daily with breakfast, Disp: 90 tablet, Rfl: 1 .  finasteride (PROSCAR) 5 MG tablet, Take 1 tablet by mouth once daily, Disp: 90 tablet, Rfl: 0 .  furosemide (LASIX) 20 MG tablet, Take 20 mg by mouth daily., Disp: , Rfl:  .  glucose blood (ONETOUCH ULTRA) test strip, 1 each by Other route daily as needed for other. Use as instructed, Disp: 100 each, Rfl: 0 .  isosorbide mononitrate (IMDUR) 60 MG 24 hr tablet, Take 60 mg by mouth daily., Disp: , Rfl:  .   levothyroxine (EUTHYROX) 75 MCG tablet, TAKE 1 TABLET BY MOUTH ONCE DAILY IN THE MORNING BEFORE BREAKFAST, Disp: 90 tablet, Rfl: 1 .  metFORMIN (GLUCOPHAGE) 500 MG tablet, Take 2 tablets (1,000 mg total) by mouth 2 (two) times daily with a meal., Disp: 360 tablet, Rfl: 1 .  ONETOUCH DELICA LANCETS 74J MISC, USE TO CHECK BLOOD SUGAR ONCE A DAY, Disp: 100 each, Rfl: 12 .  pantoprazole (PROTONIX) 20 MG tablet, Take 1 tablet (20 mg total) by mouth daily., Disp: 90 tablet, Rfl: 1 .  pioglitazone (ACTOS) 45 MG tablet, Take 1 tablet (45 mg total) by mouth daily., Disp: 90 tablet, Rfl: 1   Allergies  Allergen Reactions  . Amoxicillin Diarrhea and Nausea And Vomiting    ROS Review of Systems  Constitutional: Negative.   HENT: Negative.   Eyes: Negative.   Respiratory: Negative.   Cardiovascular: Negative.   Gastrointestinal: Negative.   Endocrine: Negative.   Genitourinary: Negative.   Musculoskeletal: Negative.   Skin: Negative.   Allergic/Immunologic: Negative.   Neurological: Negative.  Negative for seizures and syncope.  Hematological: Negative.  Does not bruise/bleed easily.  Psychiatric/Behavioral: Positive for confusion.  All other systems reviewed and are negative.    OBJECTIVE:    Physical Exam Vitals reviewed.  Constitutional:      Appearance: Normal appearance.  HENT:     Mouth/Throat:     Mouth: Mucous membranes are moist.  Eyes:     Pupils: Pupils are equal, round, and reactive to light.  Neck:     Vascular: No carotid bruit.  Cardiovascular:     Rate and Rhythm: Normal rate and regular rhythm.     Pulses: Normal pulses.     Heart sounds: Murmur heard.  Systolic murmur is present.   Pulmonary:     Effort: Pulmonary effort is normal.     Breath sounds: Normal breath sounds.  Abdominal:     General: Bowel sounds are normal.     Palpations: Abdomen is soft. There is no hepatomegaly, splenomegaly or mass.     Tenderness: There is no abdominal tenderness.      Hernia: No hernia is present.  Musculoskeletal:     Cervical back: Neck supple.     Right lower leg: No edema.     Left lower leg: No edema.  Skin:    Findings: No rash.  Neurological:     Mental Status: He is  alert and oriented to person, place, and time.     Motor: No weakness.     Gait: Gait abnormal (shuffling).  Psychiatric:        Mood and Affect: Mood normal.        Behavior: Behavior normal.     BP 111/61   Pulse 62   Wt 172 lb 4.8 oz (78.2 kg)   BMI 26.35 kg/m  Wt Readings from Last 3 Encounters:  07/06/20 172 lb 4.8 oz (78.2 kg)  06/08/20 170 lb (77.1 kg)  12/02/19 177 lb (80.3 kg)    Health Maintenance Due  Topic Date Due  . COVID-19 Vaccine (1) Never done  . OPHTHALMOLOGY EXAM  12/05/2016  . INFLUENZA VACCINE  06/25/2020    There are no preventive care reminders to display for this patient.  CBC Latest Ref Rng & Units 06/29/2020 12/02/2019 08/07/2019  WBC 3.8 - 10.8 Thousand/uL 6.9 6.5 8.4  Hemoglobin 13.2 - 17.1 g/dL 12.0(L) 11.8(L) 11.2(L)  Hematocrit 38 - 50 % 36.5(L) 36.7(L) 35.1(L)  Platelets 140 - 400 Thousand/uL 137(L) 152 149(L)   CMP Latest Ref Rng & Units 06/29/2020 06/08/2020 12/02/2019  Glucose 65 - 99 mg/dL - 131(H) 162(H)  BUN 8 - 27 mg/dL - 22 19  Creatinine 0.76 - 1.27 mg/dL - 1.09 0.91  Sodium 134 - 144 mmol/L - 139 141  Potassium 3.5 - 5.3 mmol/L 4.6 5.0 4.3  Chloride 96 - 106 mmol/L - 101 102  CO2 20 - 29 mmol/L - 24 24  Calcium 8.6 - 10.2 mg/dL - 9.6 9.3  Total Protein 6.0 - 8.5 g/dL - 7.2 7.2  Total Bilirubin 0.0 - 1.2 mg/dL - 0.4 0.4  Alkaline Phos 48 - 121 IU/L - 62 58  AST 0 - 40 IU/L - 22 24  ALT 0 - 44 IU/L - 19 18    Lab Results  Component Value Date   TSH 2.810 06/08/2020   Lab Results  Component Value Date   ALBUMIN 4.8 (H) 06/08/2020   ANIONGAP 13 08/07/2019   Lab Results  Component Value Date   CHOL 142 06/08/2020   CHOL 137 12/02/2019   CHOL 126 06/01/2019   HDL 53 06/08/2020   HDL 62 12/02/2019   HDL 62  11/04/2018   LDLCALC 68 06/08/2020   LDLCALC 59 12/02/2019   LDLCALC 61 11/04/2018   CHOLHDL 2.2 11/04/2018   CHOLHDL 2.1 10/28/2017   CHOLHDL 2.3 08/30/2015   Lab Results  Component Value Date   TRIG 118 06/08/2020   Lab Results  Component Value Date   HGBA1C 7.2 (H) 06/08/2020   HGBA1C 7.1 (H) 12/02/2019   HGBA1C 7.2 (H) 06/01/2019      ASSESSMENT & PLAN:   Problem List Items Addressed This Visit      Cardiovascular and Mediastinum   Senile purpura (HCC)    Platelet count was 137k       CAD (coronary artery disease)    Pt denies any chest pain or SOB.       SSS (sick sinus syndrome) (HCC) - Primary    Stable on anti-arythmic medication        Endocrine   Hypothyroidism    Stable.         Nervous and Auditory   Concussion with coma     Other   Anemia    Pts Hgb is 12. Pt is taking Oral Iron.       Cardiac pacemaker in situ    Pacemaker  is working well with a battery life of 28 months. He has an episode of Atrial Arythmia. Pt is on Plavix and Aspirin. Eliquis was discussed as an alternative to Plavix, but his copayment is quite high for that, so he will be sent back to Dr. Humphrey Rolls to decide whether he wants to continue the same medication or change to Eliquis or Warfarin. Pt has an unsteady gait, a Hx of anemia, thrombocytopenia, and borderline alzhimers disease, so I suggest that he continue the present medication and not to start on high risk anticoagulation therapy for Afib. This was discussed with the pt and he seems to agree with the decision.          No orders of the defined types were placed in this encounter.   Follow-up: No follow-ups on file.    Dr. Jane Canary [facility] [address], [place], [place] [address]   By signing my name below, I, [employer], attest that this documentation has been prepared under the direction and in the presence of Cletis Athens, MD. Electronically Signed: Cletis Athens, MD 07/06/20, 10:59 AM    I personally performed the services described in this documentation, which was SCRIBED in my presence. The recorded information has been reviewed and considered accurate. It has been edited as necessary during review. Cletis Athens, MD

## 2020-07-06 NOTE — Assessment & Plan Note (Signed)
Pt denies any chest pain or SOB.

## 2020-07-06 NOTE — Assessment & Plan Note (Signed)
Denies chest pain and SOB. Chest is clear.

## 2020-07-06 NOTE — Assessment & Plan Note (Signed)
Recent CBC shows that his platelet count was 137k

## 2020-07-06 NOTE — Assessment & Plan Note (Signed)
Stable on anti-arythmic medication

## 2020-07-06 NOTE — Assessment & Plan Note (Signed)
Pacemaker is working well. Please see report.

## 2020-07-06 NOTE — Assessment & Plan Note (Signed)
Platelet count was 137k

## 2020-07-06 NOTE — Assessment & Plan Note (Signed)
Pacemaker is working well with a battery life of 28 months. He has an episode of Atrial Arythmia. Pt is on Plavix and Aspirin. Eliquis was discussed as an alternative to Plavix, but his copayment is quite high for that, so he will be sent back to Dr. Humphrey Rolls to decide whether he wants to continue the same medication or change to Eliquis or Warfarin. Pt has an unsteady gait, a Hx of anemia, thrombocytopenia, and borderline alzhimers disease, so I suggest that he continue the present medication and not to start on high risk anticoagulation therapy for Afib. This was discussed with the pt and he seems to agree with the decision.

## 2020-07-06 NOTE — Assessment & Plan Note (Signed)
Stable

## 2020-07-06 NOTE — Assessment & Plan Note (Signed)
Pts Hgb is 12. Pt is taking Oral Iron.

## 2020-07-11 ENCOUNTER — Encounter: Payer: Self-pay | Admitting: Family Medicine

## 2020-07-17 ENCOUNTER — Other Ambulatory Visit: Payer: Self-pay | Admitting: Urology

## 2020-07-27 DIAGNOSIS — I10 Essential (primary) hypertension: Secondary | ICD-10-CM | POA: Diagnosis not present

## 2020-07-27 DIAGNOSIS — E782 Mixed hyperlipidemia: Secondary | ICD-10-CM | POA: Diagnosis not present

## 2020-07-27 DIAGNOSIS — I739 Peripheral vascular disease, unspecified: Secondary | ICD-10-CM | POA: Diagnosis not present

## 2020-07-27 DIAGNOSIS — I4891 Unspecified atrial fibrillation: Secondary | ICD-10-CM | POA: Diagnosis not present

## 2020-08-14 DIAGNOSIS — I509 Heart failure, unspecified: Secondary | ICD-10-CM | POA: Diagnosis not present

## 2020-08-17 DIAGNOSIS — I4892 Unspecified atrial flutter: Secondary | ICD-10-CM | POA: Diagnosis not present

## 2020-08-17 DIAGNOSIS — I4891 Unspecified atrial fibrillation: Secondary | ICD-10-CM | POA: Diagnosis not present

## 2020-08-17 DIAGNOSIS — I739 Peripheral vascular disease, unspecified: Secondary | ICD-10-CM | POA: Diagnosis not present

## 2020-08-17 DIAGNOSIS — E782 Mixed hyperlipidemia: Secondary | ICD-10-CM | POA: Diagnosis not present

## 2020-08-21 ENCOUNTER — Other Ambulatory Visit: Payer: Self-pay | Admitting: Urology

## 2020-08-21 DIAGNOSIS — E119 Type 2 diabetes mellitus without complications: Secondary | ICD-10-CM | POA: Diagnosis not present

## 2020-08-21 LAB — HM DIABETES EYE EXAM

## 2020-08-31 ENCOUNTER — Ambulatory Visit (INDEPENDENT_AMBULATORY_CARE_PROVIDER_SITE_OTHER): Payer: Medicare Other

## 2020-08-31 ENCOUNTER — Other Ambulatory Visit: Payer: Self-pay

## 2020-08-31 DIAGNOSIS — Z23 Encounter for immunization: Secondary | ICD-10-CM

## 2020-09-08 ENCOUNTER — Other Ambulatory Visit: Payer: Self-pay

## 2020-09-08 NOTE — Telephone Encounter (Signed)
Previous RL patient. Last seen in July.

## 2020-09-10 MED ORDER — ONETOUCH ULTRA VI STRP: 1.0000 | ORAL_STRIP | Freq: Every day | 0 refills | Status: DC | PRN

## 2020-09-10 MED ORDER — ONETOUCH DELICA LANCETS 33G MISC: 12 refills | Status: AC

## 2020-09-14 ENCOUNTER — Telehealth: Payer: Self-pay | Admitting: Family Medicine

## 2020-09-14 NOTE — Telephone Encounter (Signed)
RX faxed to the pharmacy. Patient's wife notified.

## 2020-09-14 NOTE — Telephone Encounter (Signed)
Pt wife is calling and need new rx for one touch ultra 2  Glucometer. [facility] [address] in [place]

## 2020-09-14 NOTE — Telephone Encounter (Signed)
Form filled out for new glucometer. Will fax once signed by provider.

## 2020-09-18 ENCOUNTER — Other Ambulatory Visit: Payer: Self-pay

## 2020-09-18 DIAGNOSIS — E1169 Type 2 diabetes mellitus with other specified complication: Secondary | ICD-10-CM

## 2020-09-18 MED ORDER — ONETOUCH ULTRA VI STRP: 1.0000 | ORAL_STRIP | Freq: Every day | 12 refills | Status: AC | PRN

## 2020-09-18 NOTE — Telephone Encounter (Signed)
Fax sent to office, Pharmacy wanted RX sent in electronically so that they can bill medicare.

## 2020-09-28 ENCOUNTER — Other Ambulatory Visit: Payer: Self-pay

## 2020-09-28 ENCOUNTER — Ambulatory Visit (INDEPENDENT_AMBULATORY_CARE_PROVIDER_SITE_OTHER): Payer: Medicare Other | Admitting: Internal Medicine

## 2020-09-28 VITALS — BP 138/69 | HR 72

## 2020-09-28 DIAGNOSIS — D692 Other nonthrombocytopenic purpura: Secondary | ICD-10-CM

## 2020-09-28 DIAGNOSIS — Z950 Presence of cardiac pacemaker: Secondary | ICD-10-CM | POA: Diagnosis not present

## 2020-09-28 DIAGNOSIS — I10 Essential (primary) hypertension: Secondary | ICD-10-CM | POA: Diagnosis not present

## 2020-10-01 ENCOUNTER — Encounter: Payer: Self-pay | Admitting: Internal Medicine

## 2020-10-01 NOTE — Assessment & Plan Note (Signed)
Patient has senile purpura so he is not a candidate for anticoagulation.

## 2020-10-01 NOTE — Progress Notes (Signed)
Established Patient Office Visit  Subjective:  Patient ID: Paul Parker, male    DOB: 1941/10/16  Age: 79 y.o. MRN: [id number]  CC:  Chief Complaint  Patient presents with  . Pacemaker Check    HPI  Paul Parker presents for pacer check and office visit  Past Medical History:  Diagnosis Date  . CAD (coronary artery disease)   . Diabetes mellitus without complication ([place])   . Heart murmur   . Hyperlipidemia   . Hypertension   . Prostate cancer [facility])     Past Surgical History:  Procedure Laterality Date  . CORONARY ARTERY BYPASS GRAFT    . CORONARY STENT PLACEMENT    . HERNIA REPAIR    . PACEMAKER PLACEMENT      Family History  Problem Relation Age of Onset  . Heart disease Mother   . Heart disease Father     Social History   Socioeconomic History  . Marital status: Married    Spouse name: Not on file  . Number of children: Not on file  . Years of education: Not on file  . Highest education level: Not on file  Occupational History  . Not on file  Tobacco Use  . Smoking status: Never Smoker  . Smokeless tobacco: Never Used  Substance and Sexual Activity  . Alcohol use: No  . Drug use: No  . Sexual activity: Not on file  Other Topics Concern  . Not on file  Social History Narrative  . Not on file   Social Determinants of Health   Financial Resource Strain:   . Difficulty of Paying Living Expenses: Not on file  Food Insecurity:   . Worried About Charity fundraiser in the Last Year: Not on file  . Ran Out of Food in the Last Year: Not on file  Transportation Needs:   . Lack of Transportation (Medical): Not on file  . Lack of Transportation (Non-Medical): Not on file  Physical Activity:   . Days of Exercise per Week: Not on file  . Minutes of Exercise per Session: Not on file  Stress:   . Feeling of Stress : Not on file  Social Connections:   . Frequency of Communication with Friends and Family: Not on file  . Frequency of Social Gatherings with  Friends and Family: Not on file  . Attends Religious Services: Not on file  . Active Member of Clubs or Organizations: Not on file  . Attends Archivist Meetings: Not on file  . Marital Status: Not on file  Intimate Partner Violence:   . Fear of Current or Ex-Partner: Not on file  . Emotionally Abused: Not on file  . Physically Abused: Not on file  . Sexually Abused: Not on file     Current Outpatient Medications:  .  amiodarone (PACERONE) 200 MG tablet, Take 100 mg by mouth daily. , Disp: , Rfl:  .  amLODipine (NORVASC) 5 MG tablet, Take 1 tablet (5 mg total) by mouth daily., Disp: 90 tablet, Rfl: 1 .  aspirin EC 81 MG tablet, Take 81 mg by mouth daily., Disp: , Rfl:  .  atorvastatin (LIPITOR) 40 MG tablet, Take 1 tablet (40 mg total) by mouth daily., Disp: 90 tablet, Rfl: 1 .  clopidogrel (PLAVIX) 75 MG tablet, Take 1 tablet (75 mg total) by mouth daily., Disp: 90 tablet, Rfl: 1 .  Docusate Calcium (STOOL SOFTENER PO), Take by mouth daily., Disp: , Rfl:  .  DULoxetine (CYMBALTA)  30 MG capsule, Take 1 capsule (30 mg total) by mouth daily., Disp: 90 capsule, Rfl: 1 .  enalapril (VASOTEC) 20 MG tablet, Take 1 tablet (20 mg total) by mouth daily., Disp: 90 tablet, Rfl: 1 .  ferrous sulfate 325 (65 FE) MG EC tablet, Take 1 tablet by mouth once daily with breakfast, Disp: 90 tablet, Rfl: 1 .  finasteride (PROSCAR) 5 MG tablet, Take 1 tablet by mouth once daily, Disp: 90 tablet, Rfl: 1 .  furosemide (LASIX) 20 MG tablet, Take 20 mg by mouth daily., Disp: , Rfl:  .  glucose blood (ONETOUCH ULTRA) test strip, 1 each by Other route daily as needed for other. Use as instructed, Disp: 100 each, Rfl: 12 .  isosorbide mononitrate (IMDUR) 60 MG 24 hr tablet, Take 60 mg by mouth daily., Disp: , Rfl:  .  levothyroxine (EUTHYROX) 75 MCG tablet, TAKE 1 TABLET BY MOUTH ONCE DAILY IN THE MORNING BEFORE BREAKFAST, Disp: 90 tablet, Rfl: 1 .  metFORMIN (GLUCOPHAGE) 500 MG tablet, Take 2 tablets  (1,000 mg total) by mouth 2 (two) times daily with a meal., Disp: 360 tablet, Rfl: 1 .  OneTouch Delica Lancets 62B MISC, USE TO CHECK BLOOD SUGAR ONCE A DAY, Disp: 100 each, Rfl: 12 .  pantoprazole (PROTONIX) 20 MG tablet, Take 1 tablet (20 mg total) by mouth daily., Disp: 90 tablet, Rfl: 1 .  pioglitazone (ACTOS) 45 MG tablet, Take 1 tablet (45 mg total) by mouth daily., Disp: 90 tablet, Rfl: 1   Allergies  Allergen Reactions  . Amoxicillin Diarrhea and Nausea And Vomiting    ROS Review of Systems  Respiratory: Negative for chest tightness and shortness of breath.   Cardiovascular: Negative for chest pain and palpitations.  Gastrointestinal: Negative for blood in stool.  Genitourinary: Negative for flank pain.  Neurological: Negative for seizures.      Objective:    Physical Exam Vitals reviewed.  Constitutional:      Appearance: Normal appearance.  HENT:     Mouth/Throat:     Mouth: Mucous membranes are moist.  Eyes:     Pupils: Pupils are equal, round, and reactive to light.  Neck:     Vascular: No carotid bruit.  Cardiovascular:     Rate and Rhythm: Normal rate and regular rhythm.     Pulses: Normal pulses.     Heart sounds: Normal heart sounds.  Pulmonary:     Effort: Pulmonary effort is normal.     Breath sounds: Normal breath sounds.  Abdominal:     General: Bowel sounds are normal.     Palpations: Abdomen is soft. There is no hepatomegaly, splenomegaly or mass.     Tenderness: There is no abdominal tenderness.     Hernia: No hernia is present.  Musculoskeletal:     Cervical back: Neck supple.     Right lower leg: No edema.     Left lower leg: No edema.  Skin:    Findings: No rash.  Neurological:     Mental Status: He is alert and oriented to person, place, and time.     Motor: No weakness.  Psychiatric:        Mood and Affect: Mood normal.        Behavior: Behavior normal.     BP 138/69   Pulse 72  Wt Readings from Last 3 Encounters:  07/06/20  172 lb 4.8 oz (78.2 kg)  06/08/20 170 lb (77.1 kg)  12/02/19 177 lb (80.3 kg)  Health Maintenance Due  Topic Date Due  . COVID-19 Vaccine (1) Never done    There are no preventive care reminders to display for this patient.  Lab Results  Component Value Date   TSH 2.810 06/08/2020   Lab Results  Component Value Date   WBC 6.9 06/29/2020   HGB 12.0 (L) 06/29/2020   HCT 36.5 (L) 06/29/2020   MCV 97.1 06/29/2020   PLT 137 (L) 06/29/2020   Lab Results  Component Value Date   NA 139 06/08/2020   K 4.6 06/29/2020   CO2 24 06/08/2020   GLUCOSE 131 (H) 06/08/2020   BUN 22 06/08/2020   CREATININE 1.09 06/08/2020   BILITOT 0.4 06/08/2020   ALKPHOS 62 06/08/2020   AST 22 06/08/2020   ALT 19 06/08/2020   PROT 7.2 06/08/2020   ALBUMIN 4.8 (H) 06/08/2020   CALCIUM 9.6 06/08/2020   ANIONGAP 13 08/07/2019   Lab Results  Component Value Date   CHOL 142 06/08/2020   Lab Results  Component Value Date   HDL 53 06/08/2020   Lab Results  Component Value Date   LDLCALC 68 06/08/2020   Lab Results  Component Value Date   TRIG 118 06/08/2020   Lab Results  Component Value Date   CHOLHDL 2.2 11/04/2018   Lab Results  Component Value Date   HGBA1C 7.2 (H) 06/08/2020      Assessment & Plan:   Problem List Items Addressed This Visit      Cardiovascular and Mediastinum   Essential hypertension   Senile purpura ([place])    Patient has senile purpura so he is not a candidate for anticoagulation.        Other   Cardiac pacemaker in situ - Primary    Pacemaker was checked with a pacemaker system analyzer advised was found to be functioning well.  Run of atrial fibrillation were noted.      Relevant Orders   PACEMAKER IN CLINIC CHECK (Completed)    Patient pacemaker was interrogated by pacemakers analyzer, battery status is okay. No programming changes were indicated after the review of the data. Histogram shows no change since the last interrogation  Atrial and  ventricular sensing thresholds were found to be acceptable  Impedance was checked and it was found to be normal. Thresholds were found to be okay on evaluation of rhythm problem. No high rate or low rate arrhythmia were noted. Estimated battery longevity is 6 months. I have personally reviewed the device data and amended the report as necessary. Patient has run of atrial fibrillation but is not a candidate for anticoagulation due to thrombocytopenia.    No orders of the defined types were placed in this encounter.   Follow-up: No follow-ups on file.    Cletis Athens, MD

## 2020-10-01 NOTE — Assessment & Plan Note (Signed)
Patient blood pressure was found to be 138/69.  Which is stable patient denies any chest pain or shortness of breath.  There is no history of syncope.  He denies any history of pain in the pacemaker site.  Denies any history of swelling of the legs.  Chest is clear abdomen is soft nontender without any splenomegaly.

## 2020-10-01 NOTE — Assessment & Plan Note (Signed)
Pacemaker was checked with a pacemaker system analyzer advised was found to be functioning well.  Run of atrial fibrillation were noted.

## 2020-10-13 ENCOUNTER — Other Ambulatory Visit: Payer: Self-pay | Admitting: Family Medicine

## 2020-10-13 DIAGNOSIS — N400 Enlarged prostate without lower urinary tract symptoms: Secondary | ICD-10-CM

## 2020-10-17 DIAGNOSIS — I4891 Unspecified atrial fibrillation: Secondary | ICD-10-CM | POA: Diagnosis not present

## 2020-10-17 DIAGNOSIS — I4892 Unspecified atrial flutter: Secondary | ICD-10-CM | POA: Diagnosis not present

## 2020-10-17 DIAGNOSIS — E782 Mixed hyperlipidemia: Secondary | ICD-10-CM | POA: Diagnosis not present

## 2020-10-17 DIAGNOSIS — I739 Peripheral vascular disease, unspecified: Secondary | ICD-10-CM | POA: Diagnosis not present

## 2020-10-24 ENCOUNTER — Other Ambulatory Visit: Payer: Self-pay

## 2020-10-24 ENCOUNTER — Other Ambulatory Visit: Payer: Medicare Other

## 2020-10-24 DIAGNOSIS — N400 Enlarged prostate without lower urinary tract symptoms: Secondary | ICD-10-CM

## 2020-10-25 LAB — PSA: Prostate Specific Ag, Serum: 2.8 ng/mL (ref 0.0–4.0)

## 2020-10-27 ENCOUNTER — Ambulatory Visit: Payer: Medicare Other | Admitting: Urology

## 2020-11-21 ENCOUNTER — Ambulatory Visit: Payer: Medicare Other | Admitting: Urology

## 2020-11-28 ENCOUNTER — Telehealth (INDEPENDENT_AMBULATORY_CARE_PROVIDER_SITE_OTHER): Payer: Medicare Other | Admitting: Family Medicine

## 2020-11-28 ENCOUNTER — Encounter: Payer: Self-pay | Admitting: Family Medicine

## 2020-11-28 DIAGNOSIS — Z20822 Contact with and (suspected) exposure to covid-19: Secondary | ICD-10-CM

## 2020-11-28 MED ORDER — HYDROCOD POLST-CPM POLST ER 10-8 MG/5ML PO SUER
5.0000 mL | Freq: Two times a day (BID) | ORAL | 0 refills | Status: AC | PRN
Start: 2020-11-28 — End: ?

## 2020-11-28 MED ORDER — PREDNISONE 10 MG PO TABS: ORAL_TABLET | ORAL | 0 refills | Status: AC

## 2020-11-28 NOTE — Addendum Note (Signed)
Addended by: Enedina Finner on: 11/28/2020 04:14 PM   Modules accepted: Orders

## 2020-11-28 NOTE — Progress Notes (Signed)
There were no vitals taken for this visit.   Subjective:    Patient ID: Paul Parker, male    DOB: 1941-07-30, 80 y.o.   MRN: [id number]  HPI: Paul Parker is a 80 y.o. male  Chief Complaint  Patient presents with  . Cough    Pt has been coughing for 2 days, has runny nose    UPPER RESPIRATORY TRACT INFECTION Duration: 2 days Worst symptom: cough, running nose Fever: no Cough: yes Shortness of breath: yes Wheezing: no Chest pain: no Chest tightness: no Chest congestion: no Nasal congestion: yes Runny nose: yes Post nasal drip: no Sneezing: no Sore throat: no Swollen glands: no Sinus pressure: no Headache: no Face pain: no Toothache: no Ear pain: no  Ear pressure: no  Eyes red/itching:no Eye drainage/crusting: no  Vomiting: no Rash: no Fatigue: yes Sick contacts: no Strep contacts: no  Context: worse Recurrent sinusitis: no Relief with OTC cold/cough medications: no  Treatments attempted: none   Relevant past medical, surgical, family and social history reviewed and updated as indicated. Interim medical history since our last visit reviewed. Allergies and medications reviewed and updated.  Review of Systems  Constitutional: Positive for fatigue. Negative for activity change, appetite change, chills, diaphoresis, fever and unexpected weight change.  HENT: Negative.   Respiratory: Positive for cough and shortness of breath. Negative for apnea, choking, chest tightness, wheezing and stridor.   Cardiovascular: Negative.   Gastrointestinal: Negative.   Musculoskeletal: Negative.   Psychiatric/Behavioral: Negative.     Per HPI unless specifically indicated above     Objective:    There were no vitals taken for this visit.  Wt Readings from Last 3 Encounters:  07/06/20 172 lb 4.8 oz (78.2 kg)  06/08/20 170 lb (77.1 kg)  12/02/19 177 lb (80.3 kg)    Physical Exam Vitals and nursing note reviewed.  Pulmonary:     Effort: Pulmonary effort is normal. No  respiratory distress.     Comments: Speaking in full sentences Neurological:     Mental Status: He is alert.  Psychiatric:        Mood and Affect: Mood normal.        Behavior: Behavior normal.        Thought Content: Thought content normal.        Judgment: Judgment normal.     Results for orders placed or performed in visit on 10/24/20  PSA  Result Value Ref Range   Prostate Specific Ag, Serum 2.8 0.0 - 4.0 ng/mL      Assessment & Plan:   Problem List Items Addressed This Visit   None   Visit Diagnoses    Suspected COVID-19 virus infection    -  Primary   Will get swabbed. Self-quarantine until results are back. Treat symptomatically with steroids and tussoinex. Await results. Call if not getting better or worse.   Relevant Orders   Novel Coronavirus, NAA ([facility])       Follow up plan: Return if symptoms worsen or fail to improve.    . This visit was completed via telephone due to the restrictions of the COVID-19 pandemic. All issues as above were discussed and addressed but no physical exam was performed. If it was felt that the patient should be evaluated in the office, they were directed there. The patient verbally consented to this visit. Patient was unable to complete an audio/visual visit due to Lack of equipment. Due to the catastrophic nature of the COVID-19 pandemic, this visit was  done through audio contact only. . Location of the patient: home . Location of the provider: work . Those involved with this call:  . Provider: Park Liter, DO . CMA: Louanna Raw, [place] . Front Desk/Registration: Barth Kirks  . Time spent on call: 21 minutes on the phone discussing health concerns. 30 minutes total spent in review of patient's record and preparation of their chart.

## 2020-11-29 NOTE — Progress Notes (Deleted)
11/30/2020 3:41 PM   Paul Parker 1941/04/05 [id number]  Referring provider: Guadalupe Maple, MD No address on file  No chief complaint on file.   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     PSA trend  3.0 in October 2016  4.7 in November 2017  5.7 in December 2018  6.1 in March 2019  5.3 in May 2019  Started finasteride  3.8 in June 2019  1.4 (2.8) in September 2019  1.8 (3.6) in October 2019  1.9 (3.8) in June 2020  1.4 (2.8) in December 2020  BPH WITH LUTS  (prostate and/or bladder) I PSS: ***     Previous IPSS score: 12/2   Previous PVR: 43 mL  Major complaint(s): ***.  Patient denies any modifying or aggravating factors.  Patient denies any gross hematuria, dysuria or suprapubic/flank pain.  Patient denies any fevers, chills, nausea or vomiting.   Currently taking: finasteride 5 mg dail   He does not have a family history of PCa.    Score:  1-7 Mild 8-19 Moderate 20-35 Severe      PMH: Past Medical History:  Diagnosis Date  . CAD (coronary artery disease)   . Diabetes mellitus without complication ([place])   . Heart murmur   . Hyperlipidemia   . Hypertension   . Prostate cancer [facility])     Surgical History: Past Surgical History:  Procedure Laterality Date  . CORONARY ARTERY BYPASS GRAFT    . CORONARY STENT PLACEMENT    . HERNIA REPAIR    . PACEMAKER PLACEMENT      Home Medications:  Allergies as of 11/30/2020      Reactions   Amoxicillin Diarrhea, Nausea And Vomiting      Medication List       Accurate as of November 29, 2020  3:41 PM. If you have any questions, ask your nurse or doctor.        amiodarone 200 MG tablet Commonly known as: PACERONE Take 100 mg by mouth daily.   amLODipine 5 MG tablet Commonly known as: NORVASC Take 1 tablet (5 mg total) by mouth daily.   aspirin EC 81 MG tablet Take 81 mg by mouth daily.   atorvastatin 40 MG tablet Commonly known as:  LIPITOR Take 1 tablet (40 mg total) by mouth daily.   chlorpheniramine-HYDROcodone 10-8 MG/5ML Suer Commonly known as: Tussionex Pennkinetic ER Take 5 mLs by mouth every 12 (twelve) hours as needed.   clopidogrel 75 MG tablet Commonly known as: PLAVIX Take 1 tablet (75 mg total) by mouth daily.   DULoxetine 30 MG capsule Commonly known as: CYMBALTA Take 1 capsule (30 mg total) by mouth daily.   enalapril 20 MG tablet Commonly known as: VASOTEC Take 1 tablet (20 mg total) by mouth daily.   ferrous sulfate 325 (65 FE) MG EC tablet Take 1 tablet by mouth once daily with breakfast   finasteride 5 MG tablet Commonly known as: PROSCAR Take 1 tablet by mouth once daily   furosemide 20 MG tablet Commonly known as: LASIX Take 20 mg by mouth daily.   isosorbide mononitrate 60 MG 24 hr tablet Commonly known as: IMDUR Take 60 mg by mouth daily.   levothyroxine 75 MCG tablet Commonly known as: Euthyrox TAKE 1 TABLET BY MOUTH ONCE DAILY IN THE MORNING BEFORE BREAKFAST   metFORMIN 500 MG tablet Commonly known as: GLUCOPHAGE Take 2 tablets (1,000 mg total)  by mouth 2 (two) times daily with a meal.   OneTouch Delica Lancets 33G Misc USE TO CHECK BLOOD SUGAR ONCE A DAY   OneTouch Ultra test strip Generic drug: glucose blood 1 each by Other route daily as needed for other. Use as instructed   pantoprazole 20 MG tablet Commonly known as: PROTONIX Take 1 tablet (20 mg total) by mouth daily.   pioglitazone 45 MG tablet Commonly known as: ACTOS Take 1 tablet (45 mg total) by mouth daily.   predniSONE 10 MG tablet Commonly known as: DELTASONE 6 tabs today, 5 tabs tomorrow, decrease by 1 daily until gone   STOOL SOFTENER PO Take by mouth daily.       Allergies:  Allergies  Allergen Reactions  . Amoxicillin Diarrhea and Nausea And Vomiting    Family History: Family History  Problem Relation Age of Onset  . Heart disease Mother   . Heart disease Father     Social  History:  reports that he has never smoked. He has never used smokeless tobacco. He reports that he does not drink alcohol and does not use drugs.  Pertinent ROS in HPI  Physical Exam: There were no vitals taken for this visit.  Constitutional:  Well nourished. Alert and oriented, No acute distress. HEENT: [place] AT, moist mucus membranes.  Trachea midline Cardiovascular: No clubbing, cyanosis, or edema. Respiratory: Normal respiratory effort, no increased work of breathing. GI: Abdomen is soft, non tender, non distended, no abdominal masses. Liver and spleen not palpable.  No hernias appreciated.  Stool sample for occult testing is not indicated.   GU: No CVA tenderness.  No bladder fullness or masses.  Patient with circumcised/uncircumcised phallus. ***Foreskin easily retracted***  Urethral meatus is patent.  No penile discharge. No penile lesions or rashes. Scrotum without lesions, cysts, rashes and/or edema.  Testicles are located scrotally bilaterally. No masses are appreciated in the testicles. Left and right epididymis are normal. Rectal: Patient with  normal sphincter tone. Anus and perineum without scarring or rashes. No rectal masses are appreciated. Prostate is approximately *** grams, *** nodules are appreciated. Seminal vesicles are normal. Skin: No rashes, bruises or suspicious lesions. Lymph: No inguinal adenopathy. Neurologic: Grossly intact, no focal deficits, moving all 4 extremities. Psychiatric: Normal mood and affect.  Laboratory Data: PSA trend  3.0 in October 2016  4.7 in November 2017  5.7 in December 2018  6.1 in March 2019  5.3 in May 2019  Started finasteride  3.8 in June 2019  1.4 (2.8) in September 2019  1.8 (3.6) in October 2019  Component     Latest Ref Rng & Units 10/09/2016 10/28/2017 01/27/2018 02/18/2018  Prostate Specific Ag, Serum     0.0 - 4.0 ng/mL 4.7 (H) 5.7 (H) 6.1 (H) CANCELED   Component     Latest Ref Rng & Units 04/09/2018 05/22/2018 08/13/2018  09/01/2018  Prostate Specific Ag, Serum     0.0 - 4.0 ng/mL 5.3 (H) 3.8 1.4 1.8   Component     Latest Ref Rng & Units 04/26/2019 10/26/2019 10/24/2020  Prostate Specific Ag, Serum     0.0 - 4.0 ng/mL 1.9 1.4 2.8   Corrected value for PSA 5.6   Component     Latest Ref Rng & Units 12/02/2019  Specific Gravity, UA     1.005 - 1.030 1.015  pH, UA     5.0 - 7.5 5.5  Color, UA     Yellow Yellow  Appearance Ur  Clear Clear  Leukocytes,UA     Negative Negative  Protein,UA     Negative/Trace Negative  Glucose, UA     Negative 3+ (A)  Ketones, UA     Negative Negative  RBC, UA     Negative Negative  Bilirubin, UA     Negative Negative  Urobilinogen, Ur     0.2 - 1.0 mg/dL 0.2  Nitrite, UA     Negative Negative   Lab Results  Component Value Date   WBC 6.9 06/29/2020   HGB 12.0 (L) 06/29/2020   HCT 36.5 (L) 06/29/2020   MCV 97.1 06/29/2020   PLT 137 (L) 06/29/2020    Lab Results  Component Value Date   CREATININE 1.09 06/08/2020    Lab Results  Component Value Date   HGBA1C 7.2 (H) 06/08/2020    Lab Results  Component Value Date   TSH 2.810 06/08/2020       Component Value Date/Time   CHOL 142 06/08/2020 1058   CHOL 126 06/01/2019 1031   CHOL 112 01/02/2013 0605   HDL 53 06/08/2020 1058   HDL 49 01/02/2013 0605   CHOLHDL 2.2 11/04/2018 1100   VLDL 19 06/01/2019 1031   VLDL 21 01/02/2013 0605   LDLCALC 68 06/08/2020 1058   LDLCALC 42 01/02/2013 0605    Lab Results  Component Value Date   AST 22 06/08/2020   Lab Results  Component Value Date   ALT 19 06/08/2020    I have reviewed the labs.   Assessment & Plan:    1. BPH with LUTS IPSS score is ***, it is stable/improving/worsening Continue conservative management, avoiding bladder irritants and timed voiding's Most bothersome symptoms is/are *** Initiate alpha-blocker (***), discussed side effects *** Initiate 5 alpha reductase inhibitor (***), discussed side effects *** Continue  tamsulosin 0.4 mg daily, alfuzosin 10 mg daily, Rapaflo 8 mg daily, terazosin, doxazosin, Cialis 5 mg daily and finasteride 5 mg daily, dutasteride 0.5 mg daily***:refills given Cannot tolerate medication or medication failure, schedule cystoscopy *** RTC in *** months for IPSS, PSA, PVR and exam     No follow-ups on file.  These notes generated with voice recognition software. I apologize for typographical errors.  Zara Council, PA-C  [facility] [address] [place], [place] [address] [phone]

## 2020-11-30 ENCOUNTER — Ambulatory Visit: Payer: Medicare Other | Admitting: Urology

## 2020-11-30 DIAGNOSIS — N400 Enlarged prostate without lower urinary tract symptoms: Secondary | ICD-10-CM

## 2020-11-30 LAB — NOVEL CORONAVIRUS, NAA: SARS-CoV-2, NAA: DETECTED — AB

## 2020-11-30 LAB — SARS-COV-2, NAA 2 DAY TAT

## 2020-11-30 NOTE — Progress Notes (Signed)
Secure message sent to [facility] infusion team to add him to the list

## 2020-12-01 ENCOUNTER — Telehealth: Payer: Self-pay

## 2020-12-01 NOTE — Telephone Encounter (Signed)
Pt. Not available to talk. Left message with wife for Infusion hotline if pt. Is interested.

## 2020-12-04 NOTE — Telephone Encounter (Signed)
Caller (patient wife) advised message was left already on the [facility] line (phone # ) [phone], informed caller of the turn around time being 48 to 72 hour.

## 2020-12-04 NOTE — Telephone Encounter (Addendum)
Patient would like a follow up call regarding COVID Infusion, patient left a VM on the COVID infusion line awaiting a call back but would like the nurse to assist with infusion information and scheduling, please advise.

## 2020-12-06 ENCOUNTER — Ambulatory Visit: Payer: Self-pay

## 2020-12-06 NOTE — Telephone Encounter (Signed)
Pt's son states that Pt and wife are confused Pt gave verbal consent ok to give information to son as not on DPR.Pt son scheduled apt for 12/24/2020 Pt's son stated they would not go to ER as EMS stated there was nothing that they could do, Pt;s son stated he rather pt be comfortably at home as he is unsure pt can sit in Urgent care or ER for so long. Pt's son stated Pt is not feeling well that he rather Pt be at home if anything was to happen that he rather him pass away at home rather than a Urgent care or  ER, Pt's son asking of infusion I read word by word message from PCP, Pt's son verbalized understanding and will be at virtual apt for Pt to better help his parent understand what is going on.Pt and wife on the phone as son was speaking with me.Pt's son made aware I would relay message to PCP and I explained how Virtual apt will be done, Verbalized understanding.

## 2020-12-06 NOTE — Telephone Encounter (Signed)
No availability this afternoon with providers.  Can we see if someone has room on schedule tomorrow for virtual for patient please.  However, I do recommend visit at urgent care this evening due to patient risks and ongoing symptoms.  Would benefit assessment face to face and imaging.  At this time due to length of symptoms is most likely past period were infusion would offer benefit, has to have symptoms less then 7 days.

## 2020-12-06 NOTE — Telephone Encounter (Signed)
Pt.'s wife reports she received the message last  Week to call infusion hotline and did not call back. Today pt. Still has a cough and temp. 100. States "he is no worse, but he is not any better." Not complaining of shortness of breath. States he com[lained of hurting all over and "We called EMS and they said he didn't need to go to the ED." Wife concerned he is not getting better. No availability today for virtual visit. Requesting to be worked in. Please advise.Spoke with [place]. Reason for Disposition . [1] PERSISTING SYMPTOMS OF COVID-19 AND [2] symptoms WORSE  Answer Assessment - Initial Assessment Questions 1. COVID-19 ONSET: "When did the symptoms of COVID-19 first start?"      11/27/20 2. DIAGNOSIS CONFIRMATION: "How were you diagnosed?" (e.g., COVID-19 oral or nasal viral test; COVID-19 antibody test; doctor visit)     Yes 3. MAIN SYMPTOM:  "What is your main concern or symptom right now?" (e.g., breathing difficulty, cough, fatigue. loss of smell)     Cough, Temp. 100 today 4. SYMPTOM ONSET: "When did the  symptoms  start?"     Last Monday 5. BETTER-SAME-WORSE: "Are you getting better, staying the same, or getting worse over the last 1 to 2 weeks?"     Same 6. RECENT MEDICAL VISIT: "Have you been seen by a healthcare provider (doctor, NP, PA) for these persisting COVID-19 symptoms?" If Yes, ask: "When were you seen?" (e.g., date)     Yes 7. COUGH: "Do you have a cough?" If Yes, ask: "How bad is the cough?"       Yes 8. FEVER: "Do you have a fever?" If Yes, ask: "What is your temperature, how was it measured, and when did it start?"     100 9. BREATHING DIFFICULTY: "Are you having any trouble breathing?" If Yes, ask: "How bad is your breathing?" (e.g., mild, moderate, severe)    - MILD: No SOB at rest, mild SOB with walking, speaks normally in sentences, can lie down, no retractions, pulse < 100.    - MODERATE: SOB at rest, SOB with minimal exertion and prefers to sit, cannot lie down  flat, speaks in phrases, mild retractions, audible wheezing, pulse 100-120.    - SEVERE: Very SOB at rest, speaks in single words, struggling to breathe, sitting hunched forward, retractions, pulse > 120       No 10. HIGH RISK DISEASE: "Do you have any chronic medical problems?" (e.g., asthma, heart or lung disease, weak immune system, obesity, etc.)       Yes 11. VACCINE: "Have you gotten the COVID-19 vaccine?" If Yes ask: "Which one, how many shots, when did you get it?"       Yes 12. PREGNANCY: "Is there any chance you are pregnant?" "When was your last menstrual period?"       n/a 13. OTHER SYMPTOMS: "Do you have any other symptoms?"  (e.g., fatigue, headache, muscle pain, weakness)       Fatigue  Protocols used: CORONAVIRUS (COVID-19) PERSISTING SYMPTOMS FOLLOW-UP CALL-A-AH

## 2020-12-06 NOTE — Telephone Encounter (Signed)
Noted, thank you.  Sees Dr. Wynetta Emery tomorrow.  I reviewed recent note and looks like symptomatic for 10 days, so definitely past window for [facility] infusion and oral outpatient treatments unfortunately.

## 2020-12-07 ENCOUNTER — Emergency Department: Payer: Medicare Other

## 2020-12-07 ENCOUNTER — Inpatient Hospital Stay: Payer: Medicare Other

## 2020-12-07 ENCOUNTER — Inpatient Hospital Stay
Admission: EM | Admit: 2020-12-07 | Discharge: 2021-01-23 | DRG: 177 | Disposition: E | Payer: Medicare Other | Attending: Internal Medicine | Admitting: Internal Medicine

## 2020-12-07 ENCOUNTER — Encounter: Payer: Self-pay | Admitting: Internal Medicine

## 2020-12-07 ENCOUNTER — Telehealth (INDEPENDENT_AMBULATORY_CARE_PROVIDER_SITE_OTHER): Payer: Medicare Other | Admitting: Family Medicine

## 2020-12-07 ENCOUNTER — Other Ambulatory Visit: Payer: Self-pay

## 2020-12-07 DIAGNOSIS — F419 Anxiety disorder, unspecified: Secondary | ICD-10-CM | POA: Diagnosis present

## 2020-12-07 DIAGNOSIS — U071 COVID-19: Principal | ICD-10-CM

## 2020-12-07 DIAGNOSIS — Z66 Do not resuscitate: Secondary | ICD-10-CM | POA: Diagnosis not present

## 2020-12-07 DIAGNOSIS — N179 Acute kidney failure, unspecified: Secondary | ICD-10-CM | POA: Diagnosis not present

## 2020-12-07 DIAGNOSIS — I248 Other forms of acute ischemic heart disease: Secondary | ICD-10-CM | POA: Diagnosis present

## 2020-12-07 DIAGNOSIS — E1165 Type 2 diabetes mellitus with hyperglycemia: Secondary | ICD-10-CM | POA: Diagnosis present

## 2020-12-07 DIAGNOSIS — M47816 Spondylosis without myelopathy or radiculopathy, lumbar region: Secondary | ICD-10-CM | POA: Diagnosis not present

## 2020-12-07 DIAGNOSIS — Z538 Procedure and treatment not carried out for other reasons: Secondary | ICD-10-CM

## 2020-12-07 DIAGNOSIS — R338 Other retention of urine: Secondary | ICD-10-CM | POA: Diagnosis present

## 2020-12-07 DIAGNOSIS — Z7189 Other specified counseling: Secondary | ICD-10-CM | POA: Diagnosis not present

## 2020-12-07 DIAGNOSIS — Z8546 Personal history of malignant neoplasm of prostate: Secondary | ICD-10-CM

## 2020-12-07 DIAGNOSIS — Z880 Allergy status to penicillin: Secondary | ICD-10-CM

## 2020-12-07 DIAGNOSIS — E871 Hypo-osmolality and hyponatremia: Secondary | ICD-10-CM | POA: Diagnosis not present

## 2020-12-07 DIAGNOSIS — E440 Moderate protein-calorie malnutrition: Secondary | ICD-10-CM | POA: Diagnosis present

## 2020-12-07 DIAGNOSIS — G9341 Metabolic encephalopathy: Secondary | ICD-10-CM | POA: Diagnosis not present

## 2020-12-07 DIAGNOSIS — M7989 Other specified soft tissue disorders: Secondary | ICD-10-CM

## 2020-12-07 DIAGNOSIS — J9690 Respiratory failure, unspecified, unspecified whether with hypoxia or hypercapnia: Secondary | ICD-10-CM | POA: Diagnosis not present

## 2020-12-07 DIAGNOSIS — I152 Hypertension secondary to endocrine disorders: Secondary | ICD-10-CM | POA: Diagnosis present

## 2020-12-07 DIAGNOSIS — J1282 Pneumonia due to coronavirus disease 2019: Secondary | ICD-10-CM | POA: Diagnosis present

## 2020-12-07 DIAGNOSIS — A419 Sepsis, unspecified organism: Secondary | ICD-10-CM | POA: Diagnosis present

## 2020-12-07 DIAGNOSIS — Z950 Presence of cardiac pacemaker: Secondary | ICD-10-CM | POA: Diagnosis not present

## 2020-12-07 DIAGNOSIS — E870 Hyperosmolality and hypernatremia: Secondary | ICD-10-CM | POA: Diagnosis not present

## 2020-12-07 DIAGNOSIS — J80 Acute respiratory distress syndrome: Secondary | ICD-10-CM | POA: Diagnosis not present

## 2020-12-07 DIAGNOSIS — Z79890 Hormone replacement therapy (postmenopausal): Secondary | ICD-10-CM

## 2020-12-07 DIAGNOSIS — E039 Hypothyroidism, unspecified: Secondary | ICD-10-CM | POA: Diagnosis present

## 2020-12-07 DIAGNOSIS — I10 Essential (primary) hypertension: Secondary | ICD-10-CM

## 2020-12-07 DIAGNOSIS — Z951 Presence of aortocoronary bypass graft: Secondary | ICD-10-CM

## 2020-12-07 DIAGNOSIS — F0390 Unspecified dementia without behavioral disturbance: Secondary | ICD-10-CM | POA: Diagnosis present

## 2020-12-07 DIAGNOSIS — E43 Unspecified severe protein-calorie malnutrition: Secondary | ICD-10-CM | POA: Diagnosis present

## 2020-12-07 DIAGNOSIS — R0902 Hypoxemia: Secondary | ICD-10-CM | POA: Diagnosis not present

## 2020-12-07 DIAGNOSIS — E785 Hyperlipidemia, unspecified: Secondary | ICD-10-CM | POA: Diagnosis present

## 2020-12-07 DIAGNOSIS — K8020 Calculus of gallbladder without cholecystitis without obstruction: Secondary | ICD-10-CM | POA: Diagnosis present

## 2020-12-07 DIAGNOSIS — E1159 Type 2 diabetes mellitus with other circulatory complications: Secondary | ICD-10-CM | POA: Diagnosis not present

## 2020-12-07 DIAGNOSIS — R9431 Abnormal electrocardiogram [ECG] [EKG]: Secondary | ICD-10-CM | POA: Diagnosis not present

## 2020-12-07 DIAGNOSIS — Z8249 Family history of ischemic heart disease and other diseases of the circulatory system: Secondary | ICD-10-CM

## 2020-12-07 DIAGNOSIS — F32A Depression, unspecified: Secondary | ICD-10-CM | POA: Diagnosis present

## 2020-12-07 DIAGNOSIS — J159 Unspecified bacterial pneumonia: Secondary | ICD-10-CM | POA: Diagnosis not present

## 2020-12-07 DIAGNOSIS — K6389 Other specified diseases of intestine: Secondary | ICD-10-CM | POA: Diagnosis not present

## 2020-12-07 DIAGNOSIS — I480 Paroxysmal atrial fibrillation: Secondary | ICD-10-CM | POA: Diagnosis present

## 2020-12-07 DIAGNOSIS — Z79899 Other long term (current) drug therapy: Secondary | ICD-10-CM

## 2020-12-07 DIAGNOSIS — Z7984 Long term (current) use of oral hypoglycemic drugs: Secondary | ICD-10-CM

## 2020-12-07 DIAGNOSIS — Z4659 Encounter for fitting and adjustment of other gastrointestinal appliance and device: Secondary | ICD-10-CM

## 2020-12-07 DIAGNOSIS — E878 Other disorders of electrolyte and fluid balance, not elsewhere classified: Secondary | ICD-10-CM | POA: Diagnosis not present

## 2020-12-07 DIAGNOSIS — I4891 Unspecified atrial fibrillation: Secondary | ICD-10-CM

## 2020-12-07 DIAGNOSIS — E11649 Type 2 diabetes mellitus with hypoglycemia without coma: Secondary | ICD-10-CM | POA: Diagnosis not present

## 2020-12-07 DIAGNOSIS — I517 Cardiomegaly: Secondary | ICD-10-CM | POA: Diagnosis not present

## 2020-12-07 DIAGNOSIS — K449 Diaphragmatic hernia without obstruction or gangrene: Secondary | ICD-10-CM | POA: Diagnosis not present

## 2020-12-07 DIAGNOSIS — N401 Enlarged prostate with lower urinary tract symptoms: Secondary | ICD-10-CM | POA: Diagnosis present

## 2020-12-07 DIAGNOSIS — Z7902 Long term (current) use of antithrombotics/antiplatelets: Secondary | ICD-10-CM

## 2020-12-07 DIAGNOSIS — R630 Anorexia: Secondary | ICD-10-CM

## 2020-12-07 DIAGNOSIS — I495 Sick sinus syndrome: Secondary | ICD-10-CM | POA: Diagnosis present

## 2020-12-07 DIAGNOSIS — Z955 Presence of coronary angioplasty implant and graft: Secondary | ICD-10-CM | POA: Diagnosis not present

## 2020-12-07 DIAGNOSIS — J9601 Acute respiratory failure with hypoxia: Secondary | ICD-10-CM | POA: Diagnosis not present

## 2020-12-07 DIAGNOSIS — J189 Pneumonia, unspecified organism: Secondary | ICD-10-CM | POA: Diagnosis not present

## 2020-12-07 DIAGNOSIS — I959 Hypotension, unspecified: Secondary | ICD-10-CM | POA: Diagnosis present

## 2020-12-07 DIAGNOSIS — Z6820 Body mass index (BMI) 20.0-20.9, adult: Secondary | ICD-10-CM

## 2020-12-07 DIAGNOSIS — Z0189 Encounter for other specified special examinations: Secondary | ICD-10-CM

## 2020-12-07 DIAGNOSIS — Z4682 Encounter for fitting and adjustment of non-vascular catheter: Secondary | ICD-10-CM | POA: Diagnosis not present

## 2020-12-07 DIAGNOSIS — I2510 Atherosclerotic heart disease of native coronary artery without angina pectoris: Secondary | ICD-10-CM | POA: Diagnosis not present

## 2020-12-07 DIAGNOSIS — Z7982 Long term (current) use of aspirin: Secondary | ICD-10-CM

## 2020-12-07 DIAGNOSIS — R0602 Shortness of breath: Secondary | ICD-10-CM | POA: Diagnosis not present

## 2020-12-07 DIAGNOSIS — Z515 Encounter for palliative care: Secondary | ICD-10-CM

## 2020-12-07 DIAGNOSIS — R7982 Elevated C-reactive protein (CRP): Secondary | ICD-10-CM | POA: Diagnosis present

## 2020-12-07 DIAGNOSIS — R069 Unspecified abnormalities of breathing: Secondary | ICD-10-CM | POA: Diagnosis not present

## 2020-12-07 LAB — URINALYSIS, COMPLETE (UACMP) WITH MICROSCOPIC
Bacteria, UA: NONE SEEN
Bilirubin Urine: NEGATIVE
Glucose, UA: >=500 mg/dL — AB
Hgb urine dipstick: NEGATIVE
Ketones, ur: 20 mg/dL — AB
Leukocytes,Ua: NEGATIVE
Nitrite: NEGATIVE
Protein, ur: 30 mg/dL — AB
Specific Gravity, Urine: >1.046 — ABNORMAL HIGH (ref 1.005–1.030)
Squamous Epithelial / HPF: NONE SEEN (ref 0–5)
pH: 5 (ref 5.0–8.0)

## 2020-12-07 LAB — TROPONIN I (HIGH SENSITIVITY)
Troponin I (High Sensitivity): 48 ng/L — ABNORMAL HIGH (ref <18)
Troponin I (High Sensitivity): 83 ng/L — ABNORMAL HIGH (ref <18)

## 2020-12-07 LAB — COMPREHENSIVE METABOLIC PANEL
ALT: 20 U/L (ref 0–44)
AST: 30 U/L (ref 15–41)
Albumin: 3.5 g/dL (ref 3.5–5.0)
Alkaline Phosphatase: 42 U/L (ref 38–126)
Anion gap: 16 — ABNORMAL HIGH (ref 5–15)
BUN: 25 mg/dL — ABNORMAL HIGH (ref 8–23)
CO2: 23 mmol/L (ref 22–32)
Calcium: 8.4 mg/dL — ABNORMAL LOW (ref 8.9–10.3)
Chloride: 97 mmol/L — ABNORMAL LOW (ref 98–111)
Creatinine, Ser: 1.05 mg/dL (ref 0.61–1.24)
GFR, Estimated: >60 mL/min (ref >60)
Glucose, Bld: 183 mg/dL — ABNORMAL HIGH (ref 70–99)
Potassium: 4.1 mmol/L (ref 3.5–5.1)
Sodium: 136 mmol/L (ref 135–145)
Total Bilirubin: 1 mg/dL (ref 0.3–1.2)
Total Protein: 7.2 g/dL (ref 6.5–8.1)

## 2020-12-07 LAB — CBC WITH DIFFERENTIAL/PLATELET
Abs Immature Granulocytes: 0.06 10*3/uL (ref 0.00–0.07)
Basophils Absolute: 0 10*3/uL (ref 0.0–0.1)
Basophils Relative: 0 %
Eosinophils Absolute: 0 10*3/uL (ref 0.0–0.5)
Eosinophils Relative: 0 %
HCT: 34.5 % — ABNORMAL LOW (ref 39.0–52.0)
Hemoglobin: 11.3 g/dL — ABNORMAL LOW (ref 13.0–17.0)
Immature Granulocytes: 1 %
Lymphocytes Relative: 6 %
Lymphs Abs: 0.6 10*3/uL — ABNORMAL LOW (ref 0.7–4.0)
MCH: 31.1 pg (ref 26.0–34.0)
MCHC: 32.8 g/dL (ref 30.0–36.0)
MCV: 95 fL (ref 80.0–100.0)
Monocytes Absolute: 0.4 10*3/uL (ref 0.1–1.0)
Monocytes Relative: 4 %
Neutro Abs: 8.6 10*3/uL — ABNORMAL HIGH (ref 1.7–7.7)
Neutrophils Relative %: 89 %
Platelets: 112 10*3/uL — ABNORMAL LOW (ref 150–400)
RBC: 3.63 MIL/uL — ABNORMAL LOW (ref 4.22–5.81)
RDW: 14.2 % (ref 11.5–15.5)
WBC: 9.6 10*3/uL (ref 4.0–10.5)
nRBC: 0 % (ref 0.0–0.2)

## 2020-12-07 LAB — BRAIN NATRIURETIC PEPTIDE: B Natriuretic Peptide: 206.4 pg/mL — ABNORMAL HIGH (ref 0.0–100.0)

## 2020-12-07 LAB — PROCALCITONIN: Procalcitonin: 0.11 ng/mL

## 2020-12-07 LAB — PROTIME-INR
INR: 1 (ref 0.8–1.2)
Prothrombin Time: 12.3 seconds (ref 11.4–15.2)

## 2020-12-07 LAB — LACTIC ACID, PLASMA
Lactic Acid, Venous: 1.6 mmol/L (ref 0.5–1.9)
Lactic Acid, Venous: 1.6 mmol/L (ref 0.5–1.9)

## 2020-12-07 LAB — CBG MONITORING, ED: Glucose-Capillary: 170 mg/dL — ABNORMAL HIGH (ref 70–99)

## 2020-12-07 LAB — FIBRIN DERIVATIVES D-DIMER (ARMC ONLY): Fibrin derivatives D-dimer (ARMC): 1599.46 ng/mL (FEU) — ABNORMAL HIGH (ref 0.00–499.00)

## 2020-12-07 MED ORDER — HYDROCOD POLST-CPM POLST ER 10-8 MG/5ML PO SUER
5.0000 mL | Freq: Two times a day (BID) | ORAL | Status: AC
  Administered 2020-12-07 – 2020-12-08 (×2): 5 mL via ORAL
  Filled 2020-12-07 (×2): qty 5

## 2020-12-07 MED ORDER — PREDNISONE 50 MG PO TABS
50.0000 mg | ORAL_TABLET | Freq: Every day | ORAL | Status: DC
  Administered 2020-12-11 – 2020-12-16 (×6): 50 mg via ORAL
  Filled 2020-12-07 (×6): qty 1

## 2020-12-07 MED ORDER — SODIUM CHLORIDE 0.9 % IV SOLN
100.0000 mg | Freq: Every day | INTRAVENOUS | Status: AC
  Administered 2020-12-08 – 2020-12-11 (×4): 100 mg via INTRAVENOUS
  Filled 2020-12-07: qty 20
  Filled 2020-12-07: qty 100
  Filled 2020-12-07 (×2): qty 20

## 2020-12-07 MED ORDER — INSULIN ASPART 100 UNIT/ML ~~LOC~~ SOLN
0.0000 [IU] | Freq: Three times a day (TID) | SUBCUTANEOUS | Status: DC
  Administered 2020-12-08 – 2020-12-09 (×4): 3 [IU] via SUBCUTANEOUS
  Administered 2020-12-10 – 2020-12-11 (×4): 5 [IU] via SUBCUTANEOUS
  Administered 2020-12-11: 3 [IU] via SUBCUTANEOUS
  Administered 2020-12-12: 8 [IU] via SUBCUTANEOUS
  Administered 2020-12-12 – 2020-12-14 (×5): 3 [IU] via SUBCUTANEOUS
  Administered 2020-12-14 – 2020-12-15 (×2): 15 [IU] via SUBCUTANEOUS
  Administered 2020-12-15: 3 [IU] via SUBCUTANEOUS
  Administered 2020-12-16: 15 [IU] via SUBCUTANEOUS
  Administered 2020-12-16 – 2020-12-17 (×3): 2 [IU] via SUBCUTANEOUS
  Administered 2020-12-17: 5 [IU] via SUBCUTANEOUS
  Administered 2020-12-18 – 2020-12-20 (×4): 8 [IU] via SUBCUTANEOUS
  Administered 2020-12-20: 3 [IU] via SUBCUTANEOUS
  Administered 2020-12-20: 11 [IU] via SUBCUTANEOUS
  Administered 2020-12-21 (×3): 3 [IU] via SUBCUTANEOUS
  Administered 2020-12-22 (×3): 2 [IU] via SUBCUTANEOUS
  Administered 2020-12-23: 5 [IU] via SUBCUTANEOUS
  Administered 2020-12-23: 3 [IU] via SUBCUTANEOUS
  Administered 2020-12-24 (×2): 2 [IU] via SUBCUTANEOUS
  Administered 2020-12-25 (×2): 3 [IU] via SUBCUTANEOUS
  Administered 2020-12-26: 5 [IU] via SUBCUTANEOUS
  Administered 2020-12-26 (×2): 8 [IU] via SUBCUTANEOUS
  Administered 2020-12-27 (×2): 5 [IU] via SUBCUTANEOUS
  Filled 2020-12-07 (×42): qty 1

## 2020-12-07 MED ORDER — AMIODARONE HCL 200 MG PO TABS
100.0000 mg | ORAL_TABLET | Freq: Every day | ORAL | Status: DC
Start: 2020-12-08 — End: 2020-12-27
  Administered 2020-12-08 – 2020-12-27 (×20): 100 mg via ORAL
  Filled 2020-12-07 (×21): qty 1

## 2020-12-07 MED ORDER — AMLODIPINE BESYLATE 5 MG PO TABS
5.0000 mg | ORAL_TABLET | Freq: Every day | ORAL | Status: DC
  Administered 2020-12-08: 5 mg via ORAL
  Filled 2020-12-07: qty 1

## 2020-12-07 MED ORDER — FERROUS SULFATE 325 (65 FE) MG PO TABS
325.0000 mg | ORAL_TABLET | Freq: Every day | ORAL | Status: DC
  Administered 2020-12-08 – 2020-12-27 (×20): 325 mg via ORAL
  Filled 2020-12-07 (×21): qty 1

## 2020-12-07 MED ORDER — ASPIRIN EC 81 MG PO TBEC
81.0000 mg | DELAYED_RELEASE_TABLET | Freq: Every day | ORAL | Status: DC
  Administered 2020-12-08 – 2020-12-21 (×14): 81 mg via ORAL
  Filled 2020-12-07 (×14): qty 1

## 2020-12-07 MED ORDER — ISOSORBIDE MONONITRATE ER 30 MG PO TB24
60.0000 mg | ORAL_TABLET | Freq: Every day | ORAL | Status: DC
  Administered 2020-12-08 – 2020-12-14 (×7): 60 mg via ORAL
  Filled 2020-12-07 (×2): qty 2
  Filled 2020-12-07: qty 1
  Filled 2020-12-07: qty 2
  Filled 2020-12-07 (×2): qty 1
  Filled 2020-12-07 (×2): qty 2
  Filled 2020-12-07: qty 1

## 2020-12-07 MED ORDER — ENOXAPARIN SODIUM 40 MG/0.4ML ~~LOC~~ SOLN
40.0000 mg | SUBCUTANEOUS | Status: DC
  Administered 2020-12-07 – 2020-12-19 (×13): 40 mg via SUBCUTANEOUS
  Filled 2020-12-07 (×13): qty 0.4

## 2020-12-07 MED ORDER — METHYLPREDNISOLONE SODIUM SUCC 40 MG IJ SOLR
0.5000 mg/kg | Freq: Two times a day (BID) | INTRAMUSCULAR | Status: AC
  Administered 2020-12-08 – 2020-12-10 (×6): 39.6 mg via INTRAVENOUS
  Filled 2020-12-07 (×6): qty 1

## 2020-12-07 MED ORDER — SODIUM CHLORIDE 0.9 % IV SOLN: 500.0000 mg | INTRAVENOUS | Status: DC

## 2020-12-07 MED ORDER — ACETAMINOPHEN 325 MG PO TABS
325.0000 mg | ORAL_TABLET | Freq: Four times a day (QID) | ORAL | Status: DC | PRN
  Administered 2020-12-27: 325 mg via ORAL
  Filled 2020-12-07: qty 1

## 2020-12-07 MED ORDER — ATORVASTATIN CALCIUM 20 MG PO TABS
40.0000 mg | ORAL_TABLET | Freq: Every day | ORAL | Status: DC
Start: 2020-12-07 — End: 2020-12-27
  Administered 2020-12-07 – 2020-12-27 (×21): 40 mg via ORAL
  Filled 2020-12-07 (×23): qty 2

## 2020-12-07 MED ORDER — ACETAMINOPHEN 500 MG PO TABS
1000.0000 mg | ORAL_TABLET | Freq: Once | ORAL | Status: AC
  Administered 2020-12-07: 1000 mg via ORAL
  Filled 2020-12-07: qty 2

## 2020-12-07 MED ORDER — SODIUM CHLORIDE 0.9 % IV SOLN
1.0000 g | INTRAVENOUS | Status: DC
  Administered 2020-12-07: 1 g via INTRAVENOUS
  Filled 2020-12-07: qty 10

## 2020-12-07 MED ORDER — CLOPIDOGREL BISULFATE 75 MG PO TABS
75.0000 mg | ORAL_TABLET | Freq: Every day | ORAL | Status: DC
Start: 2020-12-08 — End: 2020-12-27
  Administered 2020-12-08 – 2020-12-27 (×20): 75 mg via ORAL
  Filled 2020-12-07 (×21): qty 1

## 2020-12-07 MED ORDER — ONDANSETRON HCL 4 MG PO TABS: 4.0000 mg | ORAL_TABLET | Freq: Four times a day (QID) | ORAL | Status: DC | PRN

## 2020-12-07 MED ORDER — ENALAPRIL MALEATE 10 MG PO TABS
20.0000 mg | ORAL_TABLET | Freq: Every day | ORAL | Status: DC
  Administered 2020-12-07 – 2020-12-08 (×2): 20 mg via ORAL
  Filled 2020-12-07 (×2): qty 2

## 2020-12-07 MED ORDER — SODIUM CHLORIDE 0.9 % IV SOLN
200.0000 mg | Freq: Once | INTRAVENOUS | Status: AC
  Administered 2020-12-07: 200 mg via INTRAVENOUS
  Filled 2020-12-07: qty 200

## 2020-12-07 MED ORDER — FUROSEMIDE 20 MG PO TABS
20.0000 mg | ORAL_TABLET | Freq: Every day | ORAL | Status: DC
  Administered 2020-12-07 – 2020-12-12 (×6): 20 mg via ORAL
  Filled 2020-12-07 (×7): qty 1

## 2020-12-07 MED ORDER — DULOXETINE HCL 30 MG PO CPEP
30.0000 mg | ORAL_CAPSULE | Freq: Every day | ORAL | Status: DC
  Administered 2020-12-08 – 2020-12-27 (×20): 30 mg via ORAL
  Filled 2020-12-07 (×21): qty 1

## 2020-12-07 MED ORDER — METFORMIN HCL 500 MG PO TABS
1000.0000 mg | ORAL_TABLET | Freq: Two times a day (BID) | ORAL | Status: DC
Start: 2020-12-07 — End: 2020-12-10
  Administered 2020-12-07 – 2020-12-09 (×5): 1000 mg via ORAL
  Filled 2020-12-07 (×6): qty 2

## 2020-12-07 MED ORDER — ONDANSETRON HCL 4 MG/2ML IJ SOLN: 4.0000 mg | Freq: Four times a day (QID) | INTRAMUSCULAR | Status: DC | PRN

## 2020-12-07 MED ORDER — SODIUM CHLORIDE 0.9 % IV SOLN
100.0000 mg | Freq: Two times a day (BID) | INTRAVENOUS | Status: DC
  Administered 2020-12-08: 100 mg via INTRAVENOUS
  Filled 2020-12-07 (×3): qty 100

## 2020-12-07 MED ORDER — IOHEXOL 350 MG/ML SOLN
75.0000 mL | Freq: Once | INTRAVENOUS | Status: AC | PRN
  Administered 2020-12-07: 75 mL via INTRAVENOUS

## 2020-12-07 MED ORDER — METHYLPREDNISOLONE SODIUM SUCC 125 MG IJ SOLR
80.0000 mg | Freq: Once | INTRAMUSCULAR | Status: AC
  Administered 2020-12-07: 80 mg via INTRAVENOUS
  Filled 2020-12-07: qty 2

## 2020-12-07 MED ORDER — LEVOTHYROXINE SODIUM 50 MCG PO TABS
75.0000 ug | ORAL_TABLET | Freq: Every day | ORAL | Status: DC
  Administered 2020-12-08 – 2020-12-27 (×20): 75 ug via ORAL
  Filled 2020-12-07 (×5): qty 2
  Filled 2020-12-07 (×9): qty 1
  Filled 2020-12-07: qty 2
  Filled 2020-12-07: qty 1
  Filled 2020-12-07: qty 2
  Filled 2020-12-07 (×3): qty 1

## 2020-12-07 MED ORDER — INSULIN ASPART 100 UNIT/ML ~~LOC~~ SOLN
0.0000 [IU] | Freq: Every day | SUBCUTANEOUS | Status: DC
  Administered 2020-12-10 – 2020-12-13 (×3): 2 [IU] via SUBCUTANEOUS
  Administered 2020-12-14: 3 [IU] via SUBCUTANEOUS
  Administered 2020-12-15: 4 [IU] via SUBCUTANEOUS
  Administered 2020-12-16 – 2020-12-20 (×3): 3 [IU] via SUBCUTANEOUS
  Filled 2020-12-07 (×7): qty 1

## 2020-12-07 NOTE — ED Triage Notes (Signed)
Pt presents to the [facility] via EMS from home with c/o shortness of breath. EMS states that pt was diagnosed with COVID on 01/03 and has progressively worsened. EMS reports that pt's oxygent saturation was 60% on room air upon arrival to residence. Pt was placed on 15L NRB en route.

## 2020-12-07 NOTE — Consult Note (Signed)
Remdesivir - Pharmacy Brief Note   O:  ALT: 20 CXR: "Diffuse patchy bilateral airspace disease most compatible with COVID pneumonia" SpO2: Hypoxic requiring supplemental oxygen   A/P:  11/28/20 SARS-CoV-2 NAAT (+)  Remdesivir 200 mg IVPB once followed by 100 mg IVPB daily x 4 days.   Benita Gutter 12/11/20 1:36 PM

## 2020-12-07 NOTE — ED Provider Notes (Signed)
[facility] Emergency Department Provider Note  ____________________________________________   Event Date/Time   First MD Initiated Contact with Patient 10/22/21 1209     (approximate)  I have reviewed the triage vital signs and the nursing notes.   HISTORY  Chief Complaint Shortness of Breath    HPI Paul Parker is a 80 y.o. male  With h/o HTN, HLD, CAD, DM, here with SOB.   History is somewhat limited due to work of breathing.  Per report, the patient has had COVID since early January.  He was given antibody treatments.  He has been increasingly weak and short of breath.  He states that over the last 24 hours, he has felt significantly worse.  Has been unable to get around the house due to his shortness of breath.  Remainder of history limited as patient is in moderate respiratory distress on arrival.  Denies any chest pain.  No abdominal pain.  No nausea vomiting or diarrhea.     Level 5 caveat invoked as remainder of history, ROS, and physical exam limited due to patient's work of breathing.     Past Medical History:  Diagnosis Date  . CAD (coronary artery disease)   . Diabetes mellitus without complication (HCC)   . Heart murmur   . Hyperlipidemia   . Hypertension   . Prostate cancer [facility](HCC)     Patient Active Problem List   Diagnosis Date Noted  . Acute hypoxemic respiratory failure due to COVID-19 (HCC) 08-14-2021  . SSS (sick sinus syndrome) (HCC) 04/05/2020  . 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 Memory changes 12/02/2019  . Recurrent major depressive disorder, in full remission (HCC) 09/30/2018  . Anemia 09/30/2018  . Seborrheic keratosis 04/27/2018  . Advanced care planning/counseling discussion 10/28/2017  . Senile purpura (HCC) 08/30/2015  . CAD (coronary artery disease) 08/30/2015  . Concussion with coma 08/30/2015  . BPH (benign prostatic hyperplasia) 08/30/2015  . Hypothyroidism 08/30/2015  . Depression, recurrent (HCC)  08/30/2015  . 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 Hyperlipidemia   . Essential hypertension     Past Surgical History:  Procedure Laterality Date  . CORONARY ARTERY BYPASS GRAFT    . CORONARY STENT PLACEMENT    . HERNIA REPAIR    . PACEMAKER PLACEMENT      Prior to Admission medications   Medication Sig Start Date End Date Taking? Authorizing Provider  amiodarone (PACERONE) 200 MG tablet Take 100 mg by mouth daily.     [provider]  amLODipine (NORVASC) 5 MG tablet Take 1 tablet (5 mg total) by mouth daily. 06/08/20   Particia NearingLane, Rachel Elizabeth, PA-C  aspirin EC 81 MG tablet Take 81 mg by mouth daily.    [provider]  atorvastatin (LIPITOR) 40 MG tablet Take 1 tablet (40 mg total) by mouth daily. 06/08/20   Particia NearingLane, Rachel Elizabeth, PA-C  chlorpheniramine-HYDROcodone [facility](TUSSIONEX PENNKINETIC ER) 10-8 MG/5ML SUER Take 5 mLs by mouth every 12 (twelve) hours as needed. 11/28/20   Olevia PerchesJohnson, Megan P, DO  clopidogrel (PLAVIX) 75 MG tablet Take 1 tablet (75 mg total) by mouth daily. 06/08/20   Particia NearingLane, Rachel Elizabeth, PA-C  Docusate Calcium (STOOL SOFTENER PO) Take by mouth daily.    [provider]  DULoxetine (CYMBALTA) 30 MG capsule Take 1 capsule (30 mg total) by mouth daily. 06/08/20   Particia NearingLane, Rachel Elizabeth, PA-C  enalapril (VASOTEC) 20 MG tablet Take 1 tablet (20 mg total) by mouth daily. 06/08/20  Volney American, PA-C  ferrous sulfate 325 (65 FE) MG EC tablet Take 1 tablet by mouth once daily with breakfast 07/03/20   Marnee Guarneri T, NP  finasteride (PROSCAR) 5 MG tablet Take 1 tablet by mouth once daily 08/21/20   Zara Council A, PA-C  furosemide (LASIX) 20 MG tablet Take 20 mg by mouth daily. 11/08/19   [provider]  glucose blood (ONETOUCH ULTRA) test strip 1 each by Other route daily as needed for other. Use as instructed 09/18/20   Park Liter P, DO  isosorbide mononitrate (IMDUR) 60 MG 24 hr tablet Take 60 mg by  mouth daily.    [provider]  levothyroxine (EUTHYROX) 75 MCG tablet TAKE 1 TABLET BY MOUTH ONCE DAILY IN THE MORNING BEFORE BREAKFAST 06/08/20   Volney American, PA-C  metFORMIN (GLUCOPHAGE) 500 MG tablet Take 2 tablets (1,000 mg total) by mouth 2 (two) times daily with a meal. 06/08/20   Volney American, PA-C  OneTouch Delica Lancets 13Y MISC USE TO CHECK BLOOD SUGAR ONCE A DAY 09/10/20   Johnson, Megan P, DO  pantoprazole (PROTONIX) 20 MG tablet Take 1 tablet (20 mg total) by mouth daily. 06/08/20   Volney American, PA-C  pioglitazone (ACTOS) 45 MG tablet Take 1 tablet (45 mg total) by mouth daily. 06/08/20   Volney American, PA-C  predniSONE (DELTASONE) 10 MG tablet 6 tabs today, 5 tabs tomorrow, decrease by 1 daily until gone 11/28/20   Park Liter P, DO    Allergies Amoxicillin  Family History  Problem Relation Age of Onset  . Heart disease Mother   . Heart disease Father     Social History Social History   Tobacco Use  . Smoking status: Never Smoker  . Smokeless tobacco: Never Used  Substance Use Topics  . Alcohol use: No  . Drug use: No    Review of Systems  Review of Systems  Unable to perform ROS: Acuity of condition  Constitutional: Positive for chills and fatigue.  HENT: Positive for voice change.   Respiratory: Positive for cough, shortness of breath and wheezing.   Neurological: Positive for weakness.     ____________________________________________  PHYSICAL EXAM:      VITAL SIGNS: ED Triage Vitals  Enc Vitals Group     BP Dec 19, 2020 1205 (!) 151/65     Pulse Rate 12/19/2020 1205 60     Resp 2020/12/19 1205 (!) 32     Temp 2020/12/19 1224 100 F (37.8 C)     Temp Source 12-19-20 1224 Oral     SpO2 19-Dec-2020 1205 90 %     Weight 12-19-2020 1225 175 lb (79.4 kg)     Height Dec 19, 2020 1225 5\' 11"  (1.803 m)     Head Circumference --      Peak Flow --      Pain Score December 19, 2020 1225 0     Pain Loc --      Pain Edu? --       Excl. in [place]? --      Physical Exam Vitals and nursing note reviewed.  Constitutional:      General: He is not in acute distress.    Appearance: He is well-developed.  HENT:     Head: Normocephalic and atraumatic.  Eyes:     Conjunctiva/sclera: Conjunctivae normal.  Cardiovascular:     Rate and Rhythm: Normal rate and regular rhythm.     Heart sounds: Normal heart sounds. No murmur heard. No friction  rub.  Pulmonary:     Effort: Pulmonary effort is normal. Tachypnea present. No respiratory distress.     Breath sounds: Examination of the right-middle field reveals rales. Examination of the left-middle field reveals rales. Examination of the right-lower field reveals rales. Examination of the left-lower field reveals rales. Rales present. No wheezing.  Abdominal:     General: There is no distension.     Palpations: Abdomen is soft.     Tenderness: There is no abdominal tenderness.  Musculoskeletal:     Cervical back: Neck supple.     Right lower leg: No edema.     Left lower leg: No edema.  Skin:    General: Skin is warm.     Capillary Refill: Capillary refill takes less than 2 seconds.  Neurological:     Mental Status: He is alert and oriented to person, place, and time.     Motor: No abnormal muscle tone.       ____________________________________________   LABS (all labs ordered are listed, but only abnormal results are displayed)  Labs Reviewed  COMPREHENSIVE METABOLIC PANEL - Abnormal; Notable for the following components:      Result Value   Chloride 97 (*)    Glucose, Bld 183 (*)    BUN 25 (*)    Calcium 8.4 (*)    Anion gap 16 (*)    All other components within normal limits  CBC WITH DIFFERENTIAL/PLATELET - Abnormal; Notable for the following components:   RBC 3.63 (*)    Hemoglobin 11.3 (*)    HCT 34.5 (*)    Platelets 112 (*)    Neutro Abs 8.6 (*)    Lymphs Abs 0.6 (*)    All other components within normal limits  BRAIN NATRIURETIC PEPTIDE -  Abnormal; Notable for the following components:   B Natriuretic Peptide 206.4 (*)    All other components within normal limits  FIBRIN DERIVATIVES D-DIMER ([facility] ONLY) - Abnormal; Notable for the following components:   Fibrin derivatives D-dimer [facility]) 1,599.46 (*)    All other components within normal limits  TROPONIN I (HIGH SENSITIVITY) - Abnormal; Notable for the following components:   Troponin I (High Sensitivity) 48 (*)    All other components within normal limits  TROPONIN I (HIGH SENSITIVITY) - Abnormal; Notable for the following components:   Troponin I (High Sensitivity) 83 (*)    All other components within normal limits  CULTURE, BLOOD (ROUTINE X 2)  CULTURE, BLOOD (ROUTINE X 2)  CULTURE, BLOOD (SINGLE)  LACTIC ACID, PLASMA  LACTIC ACID, PLASMA  PROTIME-INR  BLOOD GAS, VENOUS  PROCALCITONIN  URINALYSIS, COMPLETE (UACMP) WITH MICROSCOPIC    ____________________________________________  EKG: AV paced rhythm.  Ventricular rate 67.  QRS 170, QTc 520.  No acute ST elevations or depressions. ________________________________________  RADIOLOGY All imaging, including plain films, CT scans, and ultrasounds, independently reviewed by me, and interpretations confirmed via formal radiology reads.  ED MD interpretation:   Chest x-ray: Diffuse patchy airspace disease consistent with multifocal pneumonia  Official radiology report(s): DG Chest Port 1 View  Result Date: 12/04/2020 CLINICAL DATA:  Short of breath.  COVID positive EXAM: PORTABLE CHEST 1 VIEW COMPARISON:  08/07/2019 FINDINGS: Moderate bilateral patchy airspace disease is new since the prior study. Probable COVID pneumonia. Postop CABG. Dual lead pacemaker. Cardiac enlargement. No significant effusion. IMPRESSION: Diffuse patchy bilateral airspace disease most compatible with COVID pneumonia. Electronically Signed   By: Franchot Gallo M.D.   On: 12/18/2020 12:43  ____________________________________________  PROCEDURES   Procedure(s) performed (including Critical Care):  .Critical Care Performed by: Duffy Bruce, MD Authorized by: Duffy Bruce, MD   Critical care provider statement:    Critical care time (minutes):  35   Critical care time was exclusive of:  Separately billable procedures and treating other patients and teaching time   Critical care was necessary to treat or prevent imminent or life-threatening deterioration of the following conditions:  Cardiac failure, circulatory failure and respiratory failure   Critical care was time spent personally by me on the following activities:  Development of treatment plan with patient or surrogate, discussions with consultants, evaluation of patient's response to treatment, examination of patient, obtaining history from patient or surrogate, ordering and performing treatments and interventions, ordering and review of laboratory studies, ordering and review of radiographic studies, pulse oximetry, re-evaluation of patient's condition and review of old charts   I assumed direction of critical care for this patient from another provider in my specialty: no   .1-3 Lead EKG Interpretation Performed by: Duffy Bruce, MD Authorized by: Duffy Bruce, MD     Interpretation: normal     ECG rate:  60-80   ECG rate assessment: normal     Rhythm: paced     Ectopy: none     Conduction: normal   Comments:     Indication: SOB    ____________________________________________  INITIAL IMPRESSION / MDM / ASSESSMENT AND PLAN / ED COURSE  As part of my medical decision making, I reviewed the following data within the [place] notes reviewed and incorporated, Old chart reviewed, Notes from prior ED visits, and [place] Controlled Substance Database       *Marlen Steward was evaluated in Emergency Department on 12/12/2020 for the symptoms described in the history of present  illness. He was evaluated in the context of the global COVID-19 pandemic, which necessitated consideration that the patient might be at risk for infection with the SARS-CoV-2 virus that causes COVID-19. Institutional protocols and algorithms that pertain to the evaluation of patients at risk for COVID-19 are in a state of rapid change based on information released by regulatory bodies including the CDC and federal and state organizations. These policies and algorithms were followed during the patient's care in the ED.  Some ED evaluations and interventions may be delayed as a result of limited staffing during the pandemic.*     Medical Decision Making:  80 year old male here with acute respiratory distress and hypoxia.  Patient placed on nonrebreather on arrival with slow improvement in his oxygen saturations.  Blood gases without signs of retention.  Symptoms are consistent with COVID-19 related hypoxic respiratory failure.  He has mild troponin and BNP elevation which I suspect is related to demand.  EKG is nonischemic and he has no chest pain.  D-dimer elevated so I have ordered a CT angio.  Otherwise, patient started on IV steroids, remdesivir, and will admit.  ____________________________________________  FINAL CLINICAL IMPRESSION(S) / ED DIAGNOSES  Final diagnoses:  Acute hypoxemic respiratory failure due to COVID-19 [facility])     MEDICATIONS GIVEN DURING THIS VISIT:  Medications  remdesivir 200 mg in sodium chloride 0.9% 250 mL IVPB (0 mg Intravenous Stopped 12/13/2020 1455)    Followed by  remdesivir 100 mg in sodium chloride 0.9 % 100 mL IVPB (has no administration in time range)  iohexol (OMNIPAQUE) 350 MG/ML injection 75 mL (has no administration in time range)  methylPREDNISolone sodium succinate (SOLU-MEDROL) 125 mg/2 mL injection  80 mg (80 mg Intravenous Given 01/06/2021 1428)  acetaminophen (TYLENOL) tablet 1,000 mg (1,000 mg Oral Given 2021-01-06 1428)     ED Discharge Orders    None        Note:  This document was prepared using Dragon voice recognition software and may include unintentional dictation errors.   Duffy Bruce, MD 2021/01/06 1550

## 2020-12-07 NOTE — ED Notes (Signed)
Took over care of pt. Pt currently on 15L humidified high flow [place] and 15L via NRB with an O2 saturation of 94%. Spoke with RT to discuss potential for heated high flow if available. Per RT they will be down to assess pt shortly. Pt sleeping and is in no other acute distress at this time. Awaiting further orders. Will continue to monitor.

## 2020-12-07 NOTE — ED Notes (Addendum)
Pt's family [facility]) updated on patient's condition and current plan of care.

## 2020-12-07 NOTE — ED Notes (Signed)
Pt's family member, Arrie Aran, can be contacted for family updates.  Dawn can be reached at [phone].

## 2020-12-07 NOTE — Progress Notes (Incomplete)
   There were no vitals taken for this visit.   Subjective:    Patient ID: Paul Parker, male    DOB: 1941-05-21, 80 y.o.   MRN: [id number]  HPI: Paul Parker is a 80 y.o. male  Chief Complaint  Patient presents with  . Covid Positive    Pt's wife states the patient was diagnosed with covid last week, feels like he is getting worse. Coughs all the time, not eating much and not drinking    Relevant past medical, surgical, family and social history reviewed and updated as indicated. Interim medical history since our last visit reviewed. Allergies and medications reviewed and updated.  Review of Systems  Per HPI unless specifically indicated above     Objective:    There were no vitals taken for this visit.  Wt Readings from Last 3 Encounters:  07/06/20 172 lb 4.8 oz (78.2 kg)  06/08/20 170 lb (77.1 kg)  12/02/19 177 lb (80.3 kg)    Physical Exam  Results for orders placed or performed in visit on 11/28/20  Novel Coronavirus, NAA ([facility])   Specimen: Saline  Result Value Ref Range   SARS-CoV-2, NAA Detected (A) Not Detected  SARS-COV-2, NAA 2 DAY TAT  Result Value Ref Range   SARS-CoV-2, NAA 2 DAY TAT Performed       Assessment & Plan:   Problem List Items Addressed This Visit   None      Follow up plan: No follow-ups on file.

## 2020-12-07 NOTE — H&P (Signed)
History and Physical   Paul Parker DOB: 12/01/1940 DOA: 12/11/2020  PCP: Volney American, PA-C  Outpatient Specialists:  Patient coming from: Home  I have personally briefly reviewed patient's old medical records in [place].  Chief Concern: Shortness of breath  HPI: Paul Parker is a 80 y.o. 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  He reports the shortness of breath started three weeks ago. He endroses subjective fever and does not know t max. He states he takes tylenol at home with improvement of the fever.  He denies sick contacts.  He reports that shortness of breath has worsened to address prompting him to call EMS.  Patient was diagnosed with COVID-19 on 11/27/2020.  At bedside, he is awake alert and oriented x3 able to follow commands, nonrebreather mask in place with nasal cannula.  He is using assessor muscles and coughing and showing difficulty speaking due to cough.  He denies chest pain, nausea, vomiting.   He reports right lower extremity swelling  Vaccination: Patient is vaccinated x2 has not received a booster yet.  ROS: Constitutional: no weight change, + fever ENT/Mouth: no sore throat, no rhinorrhea Eyes: no eye pain, no vision changes Cardiovascular: no chest pain, + dyspnea,  no edema, no palpitations Respiratory: no cough, no sputum, no wheezing Gastrointestinal: no nausea, no vomiting, no diarrhea, no constipation Genitourinary: no urinary incontinence, no dysuria, no hematuria Musculoskeletal: no arthralgias, no myalgias Skin: no skin lesions, no pruritus, Neuro: + weakness, no loss of consciousness, no syncope Psych: no anxiety, no depression, +  decrease appetite Heme/Lymph: no bruising, no bleeding  ED Course: Discussed with ED provider, patient requiring hospitalization for COVID-19 infection with hypoxia on nonrebreather at 15 L of oxygen supplementation.  Initial vitals in the ED showed temperature of 100, respiration rate elevated, heart rate in the 60s, blood pressure acceptable with MAP in the 70s and 80s.  Saturating at 90s oxygenation on nonrebreather.  CTA per ED provider ordered was read as negative for PE, multifocal COVID-pneumonia greatest in the right lower lobe.  Parenchymal involvement is moderate to advanced.  Multichamber cardiomegaly status post CABG.  Incidental cholelithiasis.  Assessment/Plan  Active Problems:   Acute hypoxemic respiratory failure due to COVID-19 [facility])   Acute hypoxic respiratory failure secondary to COVID-19 infection - Remdesivir and Solu-Medrol per ED provider-we will continue - Daily labs: CRP, D-dimer, CMP, CBC - Pro-Cal was negative at 0.11 where given chest x-ray is significant for right lower lobe pneumonia - Tussionex 5 mg p.o. every 12 hours scheduled ordered - IV doxycycline and ceftriaxone started for superimposed bacterial pneumonia -High flow nasal cannula ordered  QT prolongation- avoiding azithromycin at this time  Elevated troponin-low clinical suspicion for ACS at this time suspect secondary to demand ischemia in setting of acute hypoxic respiratory failure due to COVID-19 pneumonia  Right lower extremity swelling-ultrasound bilateral lower extremity for DVT ordered and read as negative  Hypertension- resumed home antihypertensive including amlodipine 5 mg daily, enalapril 20 mg daily, Imdur 60 mg daily  Anxiety/depression-denies SI, HI, resume duloxetine 30 mg daily  Hypothyroid- resumed home levothyroxine 75 mcg p.o. daily  Remote history of atrial fibrillation in 2013/14- not on anticoagulation currently on amiodarone - I was not able to find reason why patient  is not  on anticoagulation and my extensive chart review - Patient would benefit from follow-up with cardiology outpatient  Chart reviewed.  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 Rebecka Apley.  DVT prophylaxis: Enoxaparin Code Status: Full code Diet: Heart healthy/carb modified Family Communication: No Disposition Plan: Pending clinical course Consults called: None at this time Admission status: Inpatient to stepdown with telemetry  Past Medical History:  Diagnosis Date  . CAD (coronary artery disease)   . Diabetes mellitus without complication ([place])   . Heart murmur   . Hyperlipidemia   . Hypertension   . Prostate cancer [facility])    Past Surgical History:  Procedure Laterality Date  . CORONARY ARTERY BYPASS GRAFT    . CORONARY STENT PLACEMENT    . HERNIA REPAIR    . PACEMAKER PLACEMENT     Social History:  reports that he has never smoked. He has never used smokeless tobacco. He reports that he does not drink alcohol and does not use drugs.  Allergies  Allergen Reactions  . Amoxicillin Diarrhea and Nausea And Vomiting   Family History  Problem Relation Age of Onset  . Heart disease Mother   . Heart disease Father    Family history: Family history reviewed and not pertinent  Prior to Admission medications   Medication Sig Start Date End Date Taking? Authorizing Provider  amiodarone (PACERONE) 200 MG tablet Take 100 mg by mouth daily.     [provider]  amLODipine (NORVASC) 5 MG tablet Take 1 tablet (5 mg total) by mouth daily. 06/08/20   Volney American, PA-C  aspirin EC 81 MG tablet Take 81 mg by mouth daily.    [provider]  atorvastatin (LIPITOR) 40 MG tablet Take 1 tablet (40 mg total) by mouth daily. 06/08/20   Volney American, PA-C  chlorpheniramine-HYDROcodone [facility]) 10-8 MG/5ML SUER Take 5 mLs by mouth every 12 (twelve) hours as needed. 11/28/20    Park Liter P, DO  clopidogrel (PLAVIX) 75 MG tablet Take 1 tablet (75 mg total) by mouth daily. 06/08/20   Volney American, PA-C  Docusate Calcium (STOOL SOFTENER PO) Take by mouth daily.    [provider]  DULoxetine (CYMBALTA) 30 MG capsule Take 1 capsule (30 mg total) by mouth daily. 06/08/20   Volney American, PA-C  enalapril (VASOTEC) 20 MG tablet Take 1 tablet (20 mg total) by mouth daily. 06/08/20   Volney American, PA-C  ferrous sulfate 325 (65 FE) MG EC tablet Take 1 tablet by mouth once daily with breakfast 07/03/20   Marnee Guarneri T, NP  finasteride (PROSCAR) 5 MG tablet Take 1 tablet by mouth once daily 08/21/20   Zara Council A, PA-C  furosemide (LASIX) 20 MG tablet Take 20 mg by mouth daily. 11/08/19   [provider]  glucose blood (ONETOUCH ULTRA) test strip 1 each by Other route daily as needed for other. Use as instructed 09/18/20   Park Liter P, DO  isosorbide mononitrate (IMDUR) 60 MG 24 hr tablet Take 60 mg by mouth daily.    [provider]  levothyroxine (EUTHYROX) 75 MCG tablet TAKE 1 TABLET BY MOUTH ONCE DAILY IN THE MORNING BEFORE BREAKFAST 06/08/20   Volney American, PA-C  metFORMIN (GLUCOPHAGE) 500 MG tablet Take 2 tablets (1,000 mg total) by mouth 2 (two) times daily with a meal. 06/08/20   Orene Desanctis, Lilia Argue, PA-C  OneTouch Delica Lancets 35K MISC USE TO CHECK BLOOD  SUGAR ONCE A DAY 09/10/20   Johnson, Megan P, DO  pantoprazole (PROTONIX) 20 MG tablet Take 1 tablet (20 mg total) by mouth daily. 06/08/20   Volney American, PA-C  pioglitazone (ACTOS) 45 MG tablet Take 1 tablet (45 mg total) by mouth daily. 06/08/20   Volney American, PA-C  predniSONE (DELTASONE) 10 MG tablet 6 tabs today, 5 tabs tomorrow, decrease by 1 daily until gone 11/28/20   Valerie Roys, DO   Physical Exam: Vitals:   12/24/2020 1300 11/30/2020 1330 12/08/2020 1400 11/30/2020 1430  BP: (!) 120/55 (!) 127/54 (!) 135/54 (!)  122/95  Pulse: 62 65 60 61  Resp: (!) 30 (!) 29 (!) 29 (!) 32  Temp:      TempSrc:      SpO2: 99% 96% 95% 97%  Weight:      Height:       Constitutional: appears age-appropriate, NAD, calm, comfortable Eyes: PERRL, lids and conjunctivae normal ENMT: Mucous membranes are moist. Posterior pharynx clear of any exudate or lesions. Age-appropriate dentition. Hearing appropriate Neck: normal, supple, no masses, no thyromegaly Respiratory: clear to auscultation bilaterally, no wheezing, no crackles. Normal respiratory effort. No accessory muscle use.  Cardiovascular: Regular rate and rhythm, no murmurs / rubs / gallops. No extremity edema. 2+ pedal pulses. No carotid bruits.  Abdomen: no tenderness, no masses palpated, no hepatosplenomegaly. Bowel sounds positive.  Musculoskeletal: no clubbing / cyanosis. No joint deformity upper and lower extremities. Good ROM, no contractures, no atrophy. Normal muscle tone.  Skin: no rashes, lesions, ulcers. No induration Neurologic: Sensation intact. Strength 5/5 in all 4.  Psychiatric: Normal judgment and insight. Alert and oriented x 3. Normal mood.   EKG: independently reviewed, showing AV dual lead paced, rate of 67, QTc 528  Chest x-ray on Admission: I personally reviewed and I agree with radiologist reading as below.  DG Chest Port 1 View  Result Date: 12/09/2020 CLINICAL DATA:  Short of breath.  COVID positive EXAM: PORTABLE CHEST 1 VIEW COMPARISON:  08/07/2019 FINDINGS: Moderate bilateral patchy airspace disease is new since the prior study. Probable COVID pneumonia. Postop CABG. Dual lead pacemaker. Cardiac enlargement. No significant effusion. IMPRESSION: Diffuse patchy bilateral airspace disease most compatible with COVID pneumonia. Electronically Signed   By: Franchot Gallo M.D.   On: 12/08/2020 12:43   Labs on Admission: I have personally reviewed following labs  CBC: Recent Labs  Lab 12/11/2020 1221  WBC 9.6  NEUTROABS 8.6*  HGB 11.3*   HCT 34.5*  MCV 95.0  PLT 818*   Basic Metabolic Panel: Recent Labs  Lab 11/29/2020 1221  NA 136  K 4.1  CL 97*  CO2 23  GLUCOSE 183*  BUN 25*  CREATININE 1.05  CALCIUM 8.4*   GFR: Estimated Creatinine Clearance: 60.8 mL/min (by C-G formula based on SCr of 1.05 mg/dL). Liver Function Tests: Recent Labs  Lab 12/13/2020 1221  AST 30  ALT 20  ALKPHOS 42  BILITOT 1.0  PROT 7.2  ALBUMIN 3.5   Coagulation Profile: Recent Labs  Lab 12/05/2020 1221  INR 1.0   Urine analysis:    Component Value Date/Time   COLORURINE YELLOW (A) 09/02/2018 1516   APPEARANCEUR Clear 12/02/2019 0947   LABSPEC >1.046 (H) 09/02/2018 1516   LABSPEC 1.021 05/31/2014 2111   PHURINE 5.0 09/02/2018 1516   GLUCOSEU 3+ (A) 12/02/2019 0947   GLUCOSEU >=500 05/31/2014 2111   HGBUR NEGATIVE 09/02/2018 1516   BILIRUBINUR Negative 12/02/2019 0947   BILIRUBINUR Negative 05/31/2014  2111   [place] 09/02/2018 1516   PROTEINUR Negative 12/02/2019 [place] 09/02/2018 1516   NITRITE Negative 12/02/2019 0947   NITRITE NEGATIVE 09/02/2018 1516   LEUKOCYTESUR Negative 12/02/2019 0947   LEUKOCYTESUR Negative 05/31/2014 2111   CRITICAL CARE Performed by: Briant Cedar Daran Favaro  Total critical care time: 40 minutes  Critical care time was exclusive of separately billable procedures and treating other patients.  Critical care was necessary to treat or prevent imminent or life-threatening deterioration: acute respiratory failure.  Critical care was time spent personally by me on the following activities: development of treatment plan with patient and/or surrogate as well as nursing, discussions with consultants, evaluation of patient's response to treatment, examination of patient, obtaining history from patient or surrogate, ordering and performing treatments and interventions, ordering and review of laboratory studies, ordering and review of radiographic studies, pulse oximetry and re-evaluation  of patient's condition.  Laneya Gasaway N Damisha Wolff D.O. [facility]  If 7PM-7AM, please contact overnight-coverage provider If 7AM-7PM, please contact day coverage provider [email or web address]  12/15/2020, 3:12 PM

## 2020-12-08 DIAGNOSIS — I2510 Atherosclerotic heart disease of native coronary artery without angina pectoris: Secondary | ICD-10-CM

## 2020-12-08 DIAGNOSIS — I10 Essential (primary) hypertension: Secondary | ICD-10-CM | POA: Diagnosis not present

## 2020-12-08 DIAGNOSIS — M7989 Other specified soft tissue disorders: Secondary | ICD-10-CM | POA: Insufficient documentation

## 2020-12-08 DIAGNOSIS — U071 COVID-19: Secondary | ICD-10-CM | POA: Diagnosis not present

## 2020-12-08 LAB — COMPREHENSIVE METABOLIC PANEL
ALT: 17 U/L (ref 0–44)
AST: 29 U/L (ref 15–41)
Albumin: 3 g/dL — ABNORMAL LOW (ref 3.5–5.0)
Alkaline Phosphatase: 33 U/L — ABNORMAL LOW (ref 38–126)
Anion gap: 13 (ref 5–15)
BUN: 25 mg/dL — ABNORMAL HIGH (ref 8–23)
CO2: 23 mmol/L (ref 22–32)
Calcium: 7.7 mg/dL — ABNORMAL LOW (ref 8.9–10.3)
Chloride: 104 mmol/L (ref 98–111)
Creatinine, Ser: 1.03 mg/dL (ref 0.61–1.24)
GFR, Estimated: >60 mL/min (ref >60)
Glucose, Bld: 189 mg/dL — ABNORMAL HIGH (ref 70–99)
Potassium: 3.8 mmol/L (ref 3.5–5.1)
Sodium: 140 mmol/L (ref 135–145)
Total Bilirubin: 0.8 mg/dL (ref 0.3–1.2)
Total Protein: 6.4 g/dL — ABNORMAL LOW (ref 6.5–8.1)

## 2020-12-08 LAB — CBC WITH DIFFERENTIAL/PLATELET
Abs Immature Granulocytes: 0.06 10*3/uL (ref 0.00–0.07)
Basophils Absolute: 0 10*3/uL (ref 0.0–0.1)
Basophils Relative: 0 %
Eosinophils Absolute: 0 10*3/uL (ref 0.0–0.5)
Eosinophils Relative: 0 %
HCT: 32.3 % — ABNORMAL LOW (ref 39.0–52.0)
Hemoglobin: 10.7 g/dL — ABNORMAL LOW (ref 13.0–17.0)
Immature Granulocytes: 0 %
Lymphocytes Relative: 5 %
Lymphs Abs: 0.7 10*3/uL (ref 0.7–4.0)
MCH: 31.2 pg (ref 26.0–34.0)
MCHC: 33.1 g/dL (ref 30.0–36.0)
MCV: 94.2 fL (ref 80.0–100.0)
Monocytes Absolute: 0.2 10*3/uL (ref 0.1–1.0)
Monocytes Relative: 2 %
Neutro Abs: 13.9 10*3/uL — ABNORMAL HIGH (ref 1.7–7.7)
Neutrophils Relative %: 93 %
Platelets: 113 10*3/uL — ABNORMAL LOW (ref 150–400)
RBC: 3.43 MIL/uL — ABNORMAL LOW (ref 4.22–5.81)
RDW: 14.2 % (ref 11.5–15.5)
Smear Review: NORMAL
WBC: 14.9 10*3/uL — ABNORMAL HIGH (ref 4.0–10.5)
nRBC: 0 % (ref 0.0–0.2)

## 2020-12-08 LAB — CBG MONITORING, ED
Glucose-Capillary: 172 mg/dL — ABNORMAL HIGH (ref 70–99)
Glucose-Capillary: 121 mg/dL — ABNORMAL HIGH (ref 70–99)
Glucose-Capillary: 171 mg/dL — ABNORMAL HIGH (ref 70–99)
Glucose-Capillary: 169 mg/dL — ABNORMAL HIGH (ref 70–99)

## 2020-12-08 LAB — FIBRIN DERIVATIVES D-DIMER (ARMC ONLY): Fibrin derivatives D-dimer (ARMC): 2252.85 ng/mL (FEU) — ABNORMAL HIGH (ref 0.00–499.00)

## 2020-12-08 LAB — C-REACTIVE PROTEIN: CRP: 27.2 mg/dL — ABNORMAL HIGH (ref <1.0)

## 2020-12-08 LAB — HEMOGLOBIN A1C
Hgb A1c MFr Bld: 7.7 % — ABNORMAL HIGH (ref 4.8–5.6)
Mean Plasma Glucose: 174.29 mg/dL

## 2020-12-08 NOTE — ED Notes (Signed)
Lunch tray given.  Pt sitting up in bed eating.

## 2020-12-08 NOTE — ED Notes (Signed)
Family, Dawn, update on pt plan of care and condition via telephone.

## 2020-12-08 NOTE — Progress Notes (Signed)
[place] at [place]    MR#:  [id number]  [place]:  [birth year]  SUBJECTIVE:  patient came in with increasing shortness of breath and weakness. He was diagnosed with COVID infection January 3. Continue to progressively getting worse currently on 80% FIU two with high flow nasal cannula. Appears very weak and deconditioned.  REVIEW OF SYSTEMS:   Review of Systems  Constitutional: Negative for chills, fever and weight loss.  HENT: Negative for ear discharge, ear pain and nosebleeds.   Eyes: Negative for blurred vision, pain and discharge.  Respiratory: Positive for cough and shortness of breath. Negative for sputum production, wheezing and stridor.   Cardiovascular: Negative for chest pain, palpitations, orthopnea and PND.  Gastrointestinal: Negative for abdominal pain, diarrhea, nausea and vomiting.  Genitourinary: Negative for frequency and urgency.  Musculoskeletal: Negative for back pain and joint pain.  Neurological: Positive for weakness. Negative for sensory change, speech change and focal weakness.  Psychiatric/Behavioral: Negative for depression and hallucinations. The patient is not nervous/anxious.    Tolerating Diet: Tolerating PT: pending--pt on HHFNC  DRUG ALLERGIES:   Allergies  Allergen Reactions  . Amoxicillin Diarrhea and Nausea And Vomiting    VITALS:  Blood pressure (!) 107/53, pulse 67, temperature 98.9 F (37.2 C), temperature source Oral, resp. rate (!) 27, height 5\' 11"  (1.803 m), weight 79.4 kg, SpO2 90 %.  PHYSICAL EXAMINATION:   Physical Exam  GENERAL:  80 y.o.-year-old patient lying in the bed with moderate respiraotry distress.  HEENT: Head atraumatic, normocephalic. Oropharynx and nasopharynx clear. HHFNC NECK:  Supple, no jugular venous distention. No thyroid enlargement, no tenderness.  LUNGS: distant breath sounds bilaterally, no wheezing, rales, rhonchi. No use of accessory  muscles of respiration.  CARDIOVASCULAR: S1, S2 normal. No murmurs, rubs, or gallops. Tachy+ ABDOMEN: Soft, nontender, nondistended. Bowel sounds present. No organomegaly or mass.  EXTREMITIES: No cyanosis, clubbing or edema b/l.    NEUROLOGIC: grossly nonfocal PSYCHIATRIC:  patient is alert and oriented x 2 SKIN: No obvious rash, lesion, or ulcer.   LABORATORY PANEL:  CBC Recent Labs  Lab 12/08/20 0353  WBC 14.9*  HGB 10.7*  HCT 32.3*  PLT 113*    Chemistries  Recent Labs  Lab 12/08/20 0353  NA 140  K 3.8  CL 104  CO2 23  GLUCOSE 189*  BUN 25*  CREATININE 1.03  CALCIUM 7.7*  AST 29  ALT 17  ALKPHOS 33*  BILITOT 0.8   Cardiac Enzymes No results for input(s): TROPONINI in the last 168 hours. RADIOLOGY:  CT Angio Chest PE W and/or Wo Contrast  Result Date: 12/09/2020 CLINICAL DATA:  PE suspected, high prob COVID pneumonia.  Shortness of breath. EXAM: CT ANGIOGRAPHY CHEST WITH CONTRAST TECHNIQUE: Multidetector CT imaging of the chest was performed using the standard protocol during bolus administration of intravenous contrast. Multiplanar CT image reconstructions and MIPs were obtained to evaluate the vascular anatomy. CONTRAST:  42mL OMNIPAQUE IOHEXOL 350 MG/ML SOLN COMPARISON:  Radiograph earlier today. FINDINGS: Cardiovascular: There are no filling defects within the pulmonary arteries to suggest pulmonary embolus. Subsegmental branches are not well assessed given breathing motion artifact. Left-sided pacemaker in place with leads in the right atrium and ventricle. Multi chamber cardiomegaly post CABG. Dense calcification of native coronary arteries. No pericardial effusion. Contrast refluxes into the hepatic veins and IVC. Aortic atherosclerosis and tortuosity. No aortic aneurysm. Conventional branching pattern from the aortic arch, partially obscured by dense IV  contrast in the adjacent venous structures. Mediastinum/Nodes: No enlarged mediastinal lymph nodes. 12 mm right  hilar node is likely reactive. Patulous esophagus without wall thickening. Small hiatal hernia. No visualized nodule. Lungs/Pleura: Multifocal patchy geographic ground-glass opacities throughout both lungs. There is dense consolidation throughout much of the right lower lobe. Areas of confluent consolidation also seen in the left lower lobe. Trachea and central bronchi are patent without intraluminal debris. No septal thickening to suggest pulmonary edema. Upper Abdomen: Contrast refluxing into the hepatic veins and IVC. Calcified gallstones without acute gallbladder findings. Musculoskeletal: Median sternotomy.  Diffuse thoracic spondylosis. Review of the MIP images confirms the above findings. IMPRESSION: 1. No pulmonary embolus. 2. Multifocal COVID pneumonia, greatest in the right lower lobe. Parenchymal involvement is moderate to advanced. 3. Multi chamber cardiomegaly post CABG. Contrast refluxing into the hepatic veins and IVC consistent with elevated right heart pressures. 4. Incidental cholelithiasis. Aortic Atherosclerosis (ICD10-I70.0). Electronically Signed   By: Keith Rake M.D.   On: 11/29/2020 16:15   US Venous Img Lower Bilateral (DVT)  Result Date: 12/12/2020 CLINICAL DATA:  Shortness of breath EXAM: BILATERAL LOWER EXTREMITY VENOUS DOPPLER ULTRASOUND TECHNIQUE: Gray-scale sonography with compression, as well as color and duplex ultrasound, were performed to evaluate the deep venous system(s) from the level of the common femoral vein through the popliteal and proximal calf veins. COMPARISON:  None. FINDINGS: VENOUS Normal compressibility of the common femoral, superficial femoral, and popliteal veins. The calf veins were suboptimally visualized. Visualized portions of profunda femoral vein and great saphenous vein unremarkable. No filling defects to suggest DVT on grayscale or color Doppler imaging. Doppler waveforms show normal direction of venous flow, normal respiratory plasticity and  response to augmentation. OTHER None. Limitations: none IMPRESSION: Negative. Electronically Signed   By: Constance Holster M.D.   On: 12/09/2020 17:05   DG Chest Port 1 View  Result Date: 12/23/2020 CLINICAL DATA:  Short of breath.  COVID positive EXAM: PORTABLE CHEST 1 VIEW COMPARISON:  08/07/2019 FINDINGS: Moderate bilateral patchy airspace disease is new since the prior study. Probable COVID pneumonia. Postop CABG. Dual lead pacemaker. Cardiac enlargement. No significant effusion. IMPRESSION: Diffuse patchy bilateral airspace disease most compatible with COVID pneumonia. Electronically Signed   By: Franchot Gallo M.D.   On: 12/22/2020 12:43   ASSESSMENT AND PLAN:  Paul Parker is a 80 y.o. 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 Patient was diagnosed with COVID-19 on 11/27/2020. Vaccination: Patient is vaccinated x2 has not received a booster yet.  Acute hypoxic respiratory failure secondary to COVID-19 infection - Remdesivir and Solu-Medrol  - Daily labs: CRP 27.2 -- D-dimer 2252 --CT chest 1.No pulmonary embolus. 2. Multifocal COVID pneumonia, greatest in the right lower lobe. Parenchymal involvement is moderate to advanced. 3. Multi chamber cardiomegaly post CABG. Contrast refluxing into the hepatic veins and IVC consistent with elevated right heart pressures. - Pro-Cal was negative at 0.11 d/c abxs --cont to monitor - Tussionex 5 mg p.o. every 12 hours scheduled ordered -Currently on HHFNC80%/45 liters  Elevated troponin-low clinical suspicion for ACS at this time suspect secondary to demand ischemia in setting of acute hypoxic respiratory failure due to COVID-19 pneumonia -- troponin 83  Right  lower extremity swelling -ultrasound bilateral lower extremity for DVT  negative  Hypertension - home antihypertensive including  amlodipine 5 mg daily, enalapril 20 mg daily, Imdur 60 mg daily --soft BP--hold amlodipine and enalapril -cont lasix and imdur  Anxiety/depression- -resume duloxetine 30 mg daily  Hypothyroid-  Cont levothyroxine 75 mcg p.o. daily  history of atrial fibrillation in 2013/14-  currently on amiodarone -not able to find reason why patient is not on anticoagulation and my extensive chart review  Procedures: Family communication :Dawn (DIL) on the phone. Consults : CODE STATUS: full code. Patient's daughter-in-law will discuss with family and reach out to [place] if there is any change in code status. DVT Prophylaxis : enoxaparin  Status is: Inpatient  Remains inpatient appropriate because:Inpatient level of care appropriate due to severity of illness   Dispo: The patient is from: Home              Anticipated d/c is to: TBD              Anticipated d/c date is: > 3 days              Patient currently is not medically stable to d/c. patient has severe bilateral COVID pneumonia and hypoxic respiratory failure requiring high flow nasal cannula oxygen.  Patient is currently quite sick with covert infection. Discussed with daughter-in-law on the phone. She understands patient is sick and could deteriorate. She will discuss with her husband and patient's wife regarding code status.  Will consider palliative care to see pt       TOTAL TIME TAKING CARE OF THIS PATIENT: 25 minutes.  >50% time spent on counselling and coordination of care  Note: This dictation was prepared with Dragon dictation along with smaller phrase technology. Any transcriptional errors that result from this process are unintentional.  Fritzi Mandes M.D    [facility]   CC: Primary care physician; Volney American, PA-CPatient ID: Paul Parker, male   DOB: [birth year], 80 y.o.    MRN: [id number]

## 2020-12-08 NOTE — ED Notes (Signed)
Pt ate small amount of dinner tray and states he is finished with it. Tray cleared. HOB lowered slightly per pt request. Pt in NAD at this time. Denies further needs.

## 2020-12-09 DIAGNOSIS — M7989 Other specified soft tissue disorders: Secondary | ICD-10-CM | POA: Diagnosis not present

## 2020-12-09 DIAGNOSIS — U071 COVID-19: Secondary | ICD-10-CM | POA: Diagnosis not present

## 2020-12-09 DIAGNOSIS — E039 Hypothyroidism, unspecified: Secondary | ICD-10-CM

## 2020-12-09 DIAGNOSIS — I10 Essential (primary) hypertension: Secondary | ICD-10-CM | POA: Diagnosis not present

## 2020-12-09 LAB — CBC WITH DIFFERENTIAL/PLATELET
Abs Immature Granulocytes: 0.11 10*3/uL — ABNORMAL HIGH (ref 0.00–0.07)
Basophils Absolute: 0 10*3/uL (ref 0.0–0.1)
Basophils Relative: 0 %
Eosinophils Absolute: 0 10*3/uL (ref 0.0–0.5)
Eosinophils Relative: 0 %
HCT: 31.3 % — ABNORMAL LOW (ref 39.0–52.0)
Hemoglobin: 10 g/dL — ABNORMAL LOW (ref 13.0–17.0)
Immature Granulocytes: 1 %
Lymphocytes Relative: 4 %
Lymphs Abs: 0.7 10*3/uL (ref 0.7–4.0)
MCH: 30.7 pg (ref 26.0–34.0)
MCHC: 31.9 g/dL (ref 30.0–36.0)
MCV: 96 fL (ref 80.0–100.0)
Monocytes Absolute: 0.4 10*3/uL (ref 0.1–1.0)
Monocytes Relative: 2 %
Neutro Abs: 15.5 10*3/uL — ABNORMAL HIGH (ref 1.7–7.7)
Neutrophils Relative %: 93 %
Platelets: 120 10*3/uL — ABNORMAL LOW (ref 150–400)
RBC: 3.26 MIL/uL — ABNORMAL LOW (ref 4.22–5.81)
RDW: 14.1 % (ref 11.5–15.5)
WBC: 16.7 10*3/uL — ABNORMAL HIGH (ref 4.0–10.5)
nRBC: 0 % (ref 0.0–0.2)

## 2020-12-09 LAB — CBG MONITORING, ED
Glucose-Capillary: 160 mg/dL — ABNORMAL HIGH (ref 70–99)
Glucose-Capillary: 183 mg/dL — ABNORMAL HIGH (ref 70–99)
Glucose-Capillary: 187 mg/dL — ABNORMAL HIGH (ref 70–99)
Glucose-Capillary: 152 mg/dL — ABNORMAL HIGH (ref 70–99)

## 2020-12-09 LAB — COMPREHENSIVE METABOLIC PANEL
ALT: 19 U/L (ref 0–44)
AST: 31 U/L (ref 15–41)
Albumin: 2.7 g/dL — ABNORMAL LOW (ref 3.5–5.0)
Alkaline Phosphatase: 33 U/L — ABNORMAL LOW (ref 38–126)
Anion gap: 12 (ref 5–15)
BUN: 57 mg/dL — ABNORMAL HIGH (ref 8–23)
CO2: 23 mmol/L (ref 22–32)
Calcium: 8.3 mg/dL — ABNORMAL LOW (ref 8.9–10.3)
Chloride: 107 mmol/L (ref 98–111)
Creatinine, Ser: 1.37 mg/dL — ABNORMAL HIGH (ref 0.61–1.24)
GFR, Estimated: 52 mL/min — ABNORMAL LOW (ref >60)
Glucose, Bld: 204 mg/dL — ABNORMAL HIGH (ref 70–99)
Potassium: 4.6 mmol/L (ref 3.5–5.1)
Sodium: 142 mmol/L (ref 135–145)
Total Bilirubin: 0.7 mg/dL (ref 0.3–1.2)
Total Protein: 6 g/dL — ABNORMAL LOW (ref 6.5–8.1)

## 2020-12-09 LAB — FIBRIN DERIVATIVES D-DIMER (ARMC ONLY): Fibrin derivatives D-dimer (ARMC): 1865.78 ng/mL (FEU) — ABNORMAL HIGH (ref 0.00–499.00)

## 2020-12-09 LAB — C-REACTIVE PROTEIN: CRP: 27 mg/dL — ABNORMAL HIGH (ref <1.0)

## 2020-12-09 NOTE — ED Notes (Signed)
Took over care of pt. Pts sheets changed. Pt has no requests at this time. Will continue to monitor.

## 2020-12-09 NOTE — Progress Notes (Signed)
Paul Parker at [facility]    MR#:  [id number]  Paul Parker:  [birth year]  SUBJECTIVE:  patient came in with increasing shortness of breath and weakness. He was diagnosed with COVID infection January 3. Continue to progressively getting worse currently on 80% FIU two with high flow nasal cannula. Appears very weak and deconditioned.  Cont to require 70% Fio2/45 L/min oxygen Poor appetite  REVIEW OF SYSTEMS:   Review of Systems  Constitutional: Negative for chills, fever and weight loss.  HENT: Negative for ear discharge, ear pain and nosebleeds.   Eyes: Negative for blurred vision, pain and discharge.  Respiratory: Positive for cough and shortness of breath. Negative for sputum production, wheezing and stridor.   Cardiovascular: Negative for chest pain, palpitations, orthopnea and PND.  Gastrointestinal: Negative for abdominal pain, diarrhea, nausea and vomiting.  Genitourinary: Negative for frequency and urgency.  Musculoskeletal: Negative for back pain and joint pain.  Neurological: Positive for weakness. Negative for sensory change, speech change and focal weakness.  Psychiatric/Behavioral: Negative for depression and hallucinations. The patient is not nervous/anxious.    Tolerating Diet: Tolerating PT: pending--pt on HFNC  DRUG ALLERGIES:   Allergies  Allergen Reactions  . Amoxicillin Diarrhea and Nausea And Vomiting    VITALS:  Blood pressure 105/64, pulse (!) 39, temperature 98.9 F (37.2 C), temperature source Oral, resp. rate (!) 26, height 5\' 11"  (1.803 m), weight 79.4 kg, SpO2 94 %.  PHYSICAL EXAMINATION:   Physical Exam  GENERAL:  80 y.o.-year-old patient lying in the bed with moderate respiraotry distress. weak HEENT: Head atraumatic, normocephalic. Oropharynx and nasopharynx clear. HHFNC LUNGS: distant breath sounds bilaterally, no wheezing, rales, rhonchi. No use of accessory muscles of respiration.   CARDIOVASCULAR: S1, S2 normal. No murmurs, rubs, or gallops. Tachy+ ABDOMEN: Soft, nontender, nondistended. Bowel sounds present. No organomegaly or mass.  EXTREMITIES: No cyanosis, clubbing or edema b/l.    NEUROLOGIC: grossly nonfocal PSYCHIATRIC:  patient is alert and oriented x 2 SKIN: No obvious rash, lesion, or ulcer.   LABORATORY PANEL:  CBC Recent Labs  Lab 12/09/20 0541  WBC 16.7*  HGB 10.0*  HCT 31.3*  PLT 120*    Chemistries  Recent Labs  Lab 12/09/20 0541  NA 142  K 4.6  CL 107  CO2 23  GLUCOSE 204*  BUN 57*  CREATININE 1.37*  CALCIUM 8.3*  AST 31  ALT 19  ALKPHOS 33*  BILITOT 0.7   Cardiac Enzymes No results for input(s): TROPONINI in the last 168 hours. RADIOLOGY:  CT Angio Chest PE W and/or Wo Contrast  Result Date: 21-Dec-2020 CLINICAL DATA:  PE suspected, high prob COVID pneumonia.  Shortness of breath. EXAM: CT ANGIOGRAPHY CHEST WITH CONTRAST TECHNIQUE: Multidetector CT imaging of the chest was performed using the standard protocol during bolus administration of intravenous contrast. Multiplanar CT image reconstructions and MIPs were obtained to evaluate the vascular anatomy. CONTRAST:  87mL OMNIPAQUE IOHEXOL 350 MG/ML SOLN COMPARISON:  Radiograph earlier today. FINDINGS: Cardiovascular: There are no filling defects within the pulmonary arteries to suggest pulmonary embolus. Subsegmental branches are not well assessed given breathing motion artifact. Left-sided pacemaker in place with leads in the right atrium and ventricle. Multi chamber cardiomegaly post CABG. Dense calcification of native coronary arteries. No pericardial effusion. Contrast refluxes into the hepatic veins and IVC. Aortic atherosclerosis and tortuosity. No aortic aneurysm. Conventional branching pattern from the aortic arch, partially obscured by dense IV contrast in the  adjacent venous structures. Mediastinum/Nodes: No enlarged mediastinal lymph nodes. 12 mm right hilar node is likely  reactive. Patulous esophagus without wall thickening. Small hiatal hernia. No visualized nodule. Lungs/Pleura: Multifocal patchy geographic ground-glass opacities throughout both lungs. There is dense consolidation throughout much of the right lower lobe. Areas of confluent consolidation also seen in the left lower lobe. Trachea and central bronchi are patent without intraluminal debris. No septal thickening to suggest pulmonary edema. Upper Abdomen: Contrast refluxing into the hepatic veins and IVC. Calcified gallstones without acute gallbladder findings. Musculoskeletal: Median sternotomy.  Diffuse thoracic spondylosis. Review of the MIP images confirms the above findings. IMPRESSION: 1. No pulmonary embolus. 2. Multifocal COVID pneumonia, greatest in the right lower lobe. Parenchymal involvement is moderate to advanced. 3. Multi chamber cardiomegaly post CABG. Contrast refluxing into the hepatic veins and IVC consistent with elevated right heart pressures. 4. Incidental cholelithiasis. Aortic Atherosclerosis (ICD10-I70.0). Electronically Signed   By: Paul Parker M.D.   On: 12/06/2020 16:15   US Venous Img Lower Bilateral (DVT)  Result Date: 12/15/2020 CLINICAL DATA:  Shortness of breath EXAM: BILATERAL LOWER EXTREMITY VENOUS DOPPLER ULTRASOUND TECHNIQUE: Gray-scale sonography with compression, as well as color and duplex ultrasound, were performed to evaluate the deep venous system(s) from the level of the common femoral vein through the popliteal and proximal calf veins. COMPARISON:  None. FINDINGS: VENOUS Normal compressibility of the common femoral, superficial femoral, and popliteal veins. The calf veins were suboptimally visualized. Visualized portions of profunda femoral vein and great saphenous vein unremarkable. No filling defects to suggest DVT on grayscale or color Doppler imaging. Doppler waveforms show normal direction of venous flow, normal respiratory plasticity and response to  augmentation. OTHER None. Limitations: none IMPRESSION: Negative. Electronically Signed   By: Constance Holster M.D.   On: 12/04/2020 17:05   ASSESSMENT AND PLAN:  Paul Parker is a 80 y.o. 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 Patient was diagnosed with COVID-19 on 11/27/2020. Vaccination: Patient is vaccinated x2 has not received a booster yet.  Acute hypoxic respiratory failure secondary to COVID-19 infection - Remdesivir and Solu-Medrol  - Daily labs: CRP 27.2--27.0 -- D-dimer 2252--1865 --CT chest 1.No pulmonary embolus. 2. Multifocal COVID pneumonia, greatest in the right lower lobe. Parenchymal involvement is moderate to advanced. 3. Multi chamber cardiomegaly post CABG. Contrast refluxing into the hepatic veins and IVC consistent with elevated right heart pressures. - Pro-Cal was negative at 0.11 d/c abxs --cont to monitor - Tussionex 5 mg p.o. every 12 hours scheduled ordered -Currently on HFNC70%/45 liters --leucocytosis likely steroid related  Elevated troponin-low clinical suspicion for ACS at this time suspect secondary to demand ischemia in setting of acute hypoxic respiratory failure due to COVID-19 pneumonia -- troponin 83  Right lower extremity swelling -ultrasound bilateral lower extremity for DVT  negative  Hypertension - home antihypertensive including amlodipine 5 mg daily, enalapril 20 mg daily, Imdur 60 mg daily --soft BP--hold amlodipine and enalapril -cont lasix and imdur  Anxiety/depression -on duloxetine 30 mg daily  Hypothyroid-  Cont levothyroxine 75 mcg p.o. daily  history of atrial fibrillation in 2013/14 -- currently on amiodarone --HR 70--90's --not  on anticoagulation--reason unknown  Procedures: Family communication :Dawn (DIL) on the phone 1/15 Consults : CODE STATUS: full code d/w Patient's daughter-in-law  DVT Prophylaxis :  enoxaparin  Status is: Inpatient  Remains inpatient appropriate because:Inpatient level of care appropriate due to severity of illness   Dispo: The patient is from: Home              Anticipated d/c is to: TBD              Anticipated d/c date is: > 3 days              Patient currently is not medically stable to d/c. patient has severe bilateral COVID pneumonia and hypoxic respiratory failure requiring high flow nasal cannula oxygen.  Patient is currently quite sick with covert infection. Discussed with daughter-in-law on the phone. She understands patient is sick and could deteriorate.  Will consider palliative care to see pt on monday       TOTAL TIME TAKING CARE OF THIS PATIENT: 25 minutes.  >50% time spent on counselling and coordination of care  Note: This dictation was prepared with Dragon dictation along with smaller phrase technology. Any transcriptional errors that result from this process are unintentional.  Fritzi Mandes M.D    [facility]   CC: Primary care physician; Volney American, PA-CPatient ID: Paul Parker, male   DOB: November 24, 1941, 80 y.o.   MRN: [id number]

## 2020-12-10 DIAGNOSIS — I10 Essential (primary) hypertension: Secondary | ICD-10-CM | POA: Diagnosis not present

## 2020-12-10 DIAGNOSIS — I2510 Atherosclerotic heart disease of native coronary artery without angina pectoris: Secondary | ICD-10-CM | POA: Diagnosis not present

## 2020-12-10 DIAGNOSIS — U071 COVID-19: Secondary | ICD-10-CM | POA: Diagnosis not present

## 2020-12-10 DIAGNOSIS — M7989 Other specified soft tissue disorders: Secondary | ICD-10-CM | POA: Diagnosis not present

## 2020-12-10 LAB — CBC WITH DIFFERENTIAL/PLATELET
Abs Immature Granulocytes: 0.13 10*3/uL — ABNORMAL HIGH (ref 0.00–0.07)
Basophils Absolute: 0 10*3/uL (ref 0.0–0.1)
Basophils Relative: 0 %
Eosinophils Absolute: 0 10*3/uL (ref 0.0–0.5)
Eosinophils Relative: 0 %
HCT: 30.8 % — ABNORMAL LOW (ref 39.0–52.0)
Hemoglobin: 10.3 g/dL — ABNORMAL LOW (ref 13.0–17.0)
Immature Granulocytes: 1 %
Lymphocytes Relative: 3 %
Lymphs Abs: 0.5 10*3/uL — ABNORMAL LOW (ref 0.7–4.0)
MCH: 31.6 pg (ref 26.0–34.0)
MCHC: 33.4 g/dL (ref 30.0–36.0)
MCV: 94.5 fL (ref 80.0–100.0)
Monocytes Absolute: 0.4 10*3/uL (ref 0.1–1.0)
Monocytes Relative: 3 %
Neutro Abs: 16.4 10*3/uL — ABNORMAL HIGH (ref 1.7–7.7)
Neutrophils Relative %: 93 %
Platelets: 135 10*3/uL — ABNORMAL LOW (ref 150–400)
RBC: 3.26 MIL/uL — ABNORMAL LOW (ref 4.22–5.81)
RDW: 14.3 % (ref 11.5–15.5)
WBC: 17.5 10*3/uL — ABNORMAL HIGH (ref 4.0–10.5)
nRBC: 0 % (ref 0.0–0.2)

## 2020-12-10 LAB — COMPREHENSIVE METABOLIC PANEL
ALT: 18 U/L (ref 0–44)
AST: 27 U/L (ref 15–41)
Albumin: 2.7 g/dL — ABNORMAL LOW (ref 3.5–5.0)
Alkaline Phosphatase: 34 U/L — ABNORMAL LOW (ref 38–126)
Anion gap: 13 (ref 5–15)
BUN: 71 mg/dL — ABNORMAL HIGH (ref 8–23)
CO2: 23 mmol/L (ref 22–32)
Calcium: 8.4 mg/dL — ABNORMAL LOW (ref 8.9–10.3)
Chloride: 108 mmol/L (ref 98–111)
Creatinine, Ser: 1.28 mg/dL — ABNORMAL HIGH (ref 0.61–1.24)
GFR, Estimated: 57 mL/min — ABNORMAL LOW (ref >60)
Glucose, Bld: 221 mg/dL — ABNORMAL HIGH (ref 70–99)
Potassium: 4.5 mmol/L (ref 3.5–5.1)
Sodium: 144 mmol/L (ref 135–145)
Total Bilirubin: 0.8 mg/dL (ref 0.3–1.2)
Total Protein: 6.2 g/dL — ABNORMAL LOW (ref 6.5–8.1)

## 2020-12-10 LAB — GLUCOSE, CAPILLARY
Glucose-Capillary: 237 mg/dL — ABNORMAL HIGH (ref 70–99)
Glucose-Capillary: 222 mg/dL — ABNORMAL HIGH (ref 70–99)
Glucose-Capillary: 220 mg/dL — ABNORMAL HIGH (ref 70–99)

## 2020-12-10 LAB — MAGNESIUM: Magnesium: 2.6 mg/dL — ABNORMAL HIGH (ref 1.7–2.4)

## 2020-12-10 LAB — MRSA PCR SCREENING: MRSA by PCR: NEGATIVE

## 2020-12-10 LAB — C-REACTIVE PROTEIN: CRP: 21.5 mg/dL — ABNORMAL HIGH (ref <1.0)

## 2020-12-10 LAB — FIBRIN DERIVATIVES D-DIMER (ARMC ONLY): Fibrin derivatives D-dimer (ARMC): 1706.39 ng/mL (FEU) — ABNORMAL HIGH (ref 0.00–499.00)

## 2020-12-10 MED ORDER — HYDROCOD POLST-CPM POLST ER 10-8 MG/5ML PO SUER
5.0000 mL | Freq: Two times a day (BID) | ORAL | Status: DC
  Administered 2020-12-10 – 2020-12-27 (×32): 5 mL via ORAL
  Filled 2020-12-10 (×31): qty 5

## 2020-12-10 MED ORDER — INSULIN GLARGINE 100 UNIT/ML ~~LOC~~ SOLN
10.0000 [IU] | Freq: Every day | SUBCUTANEOUS | Status: DC
  Administered 2020-12-10 – 2020-12-24 (×15): 10 [IU] via SUBCUTANEOUS
  Filled 2020-12-10 (×17): qty 0.1

## 2020-12-10 MED ORDER — BARICITINIB 2 MG PO TABS
4.0000 mg | ORAL_TABLET | Freq: Every day | ORAL | Status: DC
  Administered 2020-12-10: 4 mg via ORAL
  Filled 2020-12-10: qty 2

## 2020-12-10 MED ORDER — GUAIFENESIN-DM 100-10 MG/5ML PO SYRP
10.0000 mL | ORAL_SOLUTION | ORAL | Status: DC | PRN
  Administered 2020-12-10 – 2020-12-23 (×2): 10 mL via ORAL
  Filled 2020-12-10 (×3): qty 10

## 2020-12-10 MED ORDER — SODIUM CHLORIDE 0.9 % IV SOLN
1.0000 g | INTRAVENOUS | Status: DC
  Administered 2020-12-10 – 2020-12-12 (×3): 1 g via INTRAVENOUS
  Filled 2020-12-10: qty 10
  Filled 2020-12-10 (×2): qty 1

## 2020-12-10 MED ORDER — BARICITINIB 2 MG PO TABS
2.0000 mg | ORAL_TABLET | Freq: Every day | ORAL | Status: DC
  Administered 2020-12-11 – 2020-12-12 (×2): 2 mg via ORAL
  Filled 2020-12-10 (×2): qty 1

## 2020-12-10 MED ORDER — CHLORHEXIDINE GLUCONATE CLOTH 2 % EX PADS
6.0000 | MEDICATED_PAD | Freq: Every day | CUTANEOUS | Status: DC
  Administered 2020-12-10 – 2020-12-13 (×4): 6 via TOPICAL

## 2020-12-10 NOTE — ED Notes (Signed)
Advised nurse that patient has assigned bed 

## 2020-12-10 NOTE — Progress Notes (Signed)
[place] [facility]    MR#:  [id number]  [place]:  1941/04/17  SUBJECTIVE:  patient came in with increasing shortness of breath and weakness. He was diagnosed with COVID infection January 3. Continue to progressively getting worse currently on 80% FIU two with high flow nasal cannula. Appears very weak and deconditioned.  Cont to require 80% Fio2/45 L/min oxygen Poor appetite, weak  REVIEW OF SYSTEMS:   Review of Systems  Constitutional: Negative for chills, fever and weight loss.  HENT: Negative for ear discharge, ear pain and nosebleeds.   Eyes: Negative for blurred vision, pain and discharge.  Respiratory: Positive for cough and shortness of breath. Negative for sputum production, wheezing and stridor.   Cardiovascular: Negative for chest pain, palpitations, orthopnea and PND.  Gastrointestinal: Negative for abdominal pain, diarrhea, nausea and vomiting.  Genitourinary: Negative for frequency and urgency.  Musculoskeletal: Negative for back pain and joint pain.  Neurological: Positive for weakness. Negative for sensory change, speech change and focal weakness.  Psychiatric/Behavioral: Negative for depression and hallucinations. The patient is not nervous/anxious.    Tolerating Diet: Tolerating PT: pending--pt on HFNC  DRUG ALLERGIES:   Allergies  Allergen Reactions  . Amoxicillin Diarrhea and Nausea And Vomiting    VITALS:  Blood pressure (!) 123/53, pulse (!) 40, temperature (!) 96.9 F (36.1 C), temperature source Oral, resp. rate (!) 24, height 5\' 11"  (1.803 m), weight 74.9 kg, SpO2 99 %.  PHYSICAL EXAMINATION:   Physical Exam  GENERAL:  80 y.o.-year-old patient lying in the bed with moderate respiraotry distress. weak HEENT: Head atraumatic, normocephalic. Oropharynx and nasopharynx clear. HHFNC LUNGS: distant breath sounds bilaterally, no wheezing, rales, rhonchi. No use of accessory muscles of  respiration.  CARDIOVASCULAR: S1, S2 normal. No murmurs, rubs, or gallops. Tachy+ ABDOMEN: Soft, nontender, nondistended. Bowel sounds present. No organomegaly or mass.  EXTREMITIES: No cyanosis, clubbing or edema b/l.    NEUROLOGIC: grossly nonfocal PSYCHIATRIC:  patient is alert and oriented x 2 SKIN: No obvious rash, lesion, or ulcer.   LABORATORY PANEL:  CBC Recent Labs  Lab 12/10/20 0412  WBC 17.5*  HGB 10.3*  HCT 30.8*  PLT 135*    Chemistries  Recent Labs  Lab 12/10/20 0412  NA 144  K 4.5  CL 108  CO2 23  GLUCOSE 221*  BUN 71*  CREATININE 1.28*  CALCIUM 8.4*  AST 27  ALT 18  ALKPHOS 34*  BILITOT 0.8   Cardiac Enzymes No results for input(s): TROPONINI in the last 168 hours. RADIOLOGY:  No results found. ASSESSMENT AND PLAN:  Paul Parker is a 80 y.o. 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 Patient was diagnosed with COVID-19 on 11/27/2020. Vaccination: Patient is vaccinated x2 has not received a booster yet.  Acute hypoxic respiratory failure secondary to COVID-19 infection - Remdesivir and Solu-Medrol  - Daily labs: CRP 27.2--27.0 -- D-dimer 2252--1865 --CT chest 1.No pulmonary embolus. 2. Multifocal COVID pneumonia, greatest in the right lower lobe. Parenchymal involvement is moderate to advanced. 3. Multi chamber cardiomegaly post CABG. Contrast refluxing into the hepatic veins and IVC consistent with elevated right heart pressures. -Pro-Cal was negative at 0.11 d/c abxs --cont to monitor -Tussionex 5 mg p.o. every 12 hours scheduled  ordered -Currently on HFNC70%/45 liters --leucocytosis likely steroid related  Elevated troponin-low clinical suspicion for ACS at  this time suspect secondary to demand ischemia in setting of acute hypoxic respiratory failure due to COVID-19 pneumonia -- troponin 83  Right lower extremity swelling -ultrasound bilateral lower extremity for DVT  negative  Hypertension - home antihypertensive including amlodipine 5 mg daily, enalapril 20 mg daily, Imdur 60 mg daily --soft BP--hold amlodipine and enalapril -cont lasix and imdur  Anxiety/depression -on duloxetine 30 mg daily  Hypothyroid - levothyroxine 75 mcg p.o.daily  history of atrial fibrillation in 2013/14 -- currently on amiodarone --HR 70--90's --not on anticoagulation--reason unknown  Procedures: Family communication :Dawn (DIL) on the phone 1/16 Consults : CODE STATUS: full code d/w Patient's daughter-in-law  DVT Prophylaxis : enoxaparin  Status is: Inpatient  Remains inpatient appropriate because:Inpatient level of care appropriate due to severity of illness   Dispo: The patient is from: Home              Anticipated d/c is to: TBD              Anticipated d/c date is: > 3 days              Patient currently is not medically stable to d/c. patient has severe bilateral COVID pneumonia and hypoxic respiratory failure requiring high flow nasal cannula oxygen.  Patient is currently quite sick with covert infection. Discussed with daughter-in-law on the phone. She understands patient is sick and could deteriorate.  Will consider palliative care to see pt on monday       TOTAL TIME TAKING CARE OF THIS PATIENT: 25 minutes.  >50% time spent on counselling and coordination of care  Note: This dictation was prepared with Dragon dictation along with smaller phrase technology. Any transcriptional errors that result from this process are unintentional.  Fritzi Mandes M.D    [facility]   CC: Primary care physician; Volney American, PA-CPatient ID: Paul Parker, male   DOB: 09-23-1941, 80 y.o.   MRN: [id number]

## 2020-12-10 NOTE — Progress Notes (Signed)
Neuro: Pt A&OX3, neuro exam WNL. Will continue to monitor.    Respiratory: Pt on HFNC 40L/50% with o2 sats > 90%.   Cardiovascular: Pt afebrile, BP WNL and heart rhythm Vpaced with 1st degree AV block.    GI/GU: Pt voiding in external catheter, on heart healthy diet at this time and tolerating.   Skin: Skin intact with no s/s of skin breakdown at this time. Pt able to reposition independently.    Pain: Pt with no reports of pain at this time.   Lines: Pt with PIV in place, no signs or symptoms of complications at this time.   Drips:No IV drips, KVO to maintain IV patency.   Events: NO acute events throughout shift. Pts plan of care to continue with current regimen, Family updated and no further questions at this time.

## 2020-12-10 NOTE — Plan of Care (Signed)

## 2020-12-10 NOTE — Progress Notes (Signed)
While being roomed, patient's son stated his oxygen was 60%. Appointment cancelled and patient instructed to call EMS immediately.

## 2020-12-11 DIAGNOSIS — Z515 Encounter for palliative care: Secondary | ICD-10-CM | POA: Diagnosis not present

## 2020-12-11 DIAGNOSIS — I10 Essential (primary) hypertension: Secondary | ICD-10-CM | POA: Diagnosis not present

## 2020-12-11 DIAGNOSIS — Z7189 Other specified counseling: Secondary | ICD-10-CM

## 2020-12-11 DIAGNOSIS — U071 COVID-19: Secondary | ICD-10-CM | POA: Diagnosis not present

## 2020-12-11 DIAGNOSIS — E039 Hypothyroidism, unspecified: Secondary | ICD-10-CM | POA: Diagnosis not present

## 2020-12-11 DIAGNOSIS — M7989 Other specified soft tissue disorders: Secondary | ICD-10-CM | POA: Diagnosis not present

## 2020-12-11 LAB — CBC WITH DIFFERENTIAL/PLATELET
Abs Immature Granulocytes: 0.12 10*3/uL — ABNORMAL HIGH (ref 0.00–0.07)
Basophils Absolute: 0 10*3/uL (ref 0.0–0.1)
Basophils Relative: 0 %
Eosinophils Absolute: 0 10*3/uL (ref 0.0–0.5)
Eosinophils Relative: 0 %
HCT: 31.2 % — ABNORMAL LOW (ref 39.0–52.0)
Hemoglobin: 10.1 g/dL — ABNORMAL LOW (ref 13.0–17.0)
Immature Granulocytes: 1 %
Lymphocytes Relative: 6 %
Lymphs Abs: 0.6 10*3/uL — ABNORMAL LOW (ref 0.7–4.0)
MCH: 31 pg (ref 26.0–34.0)
MCHC: 32.4 g/dL (ref 30.0–36.0)
MCV: 95.7 fL (ref 80.0–100.0)
Monocytes Absolute: 0.3 10*3/uL (ref 0.1–1.0)
Monocytes Relative: 3 %
Neutro Abs: 9.6 10*3/uL — ABNORMAL HIGH (ref 1.7–7.7)
Neutrophils Relative %: 90 %
Platelets: 162 10*3/uL (ref 150–400)
RBC: 3.26 MIL/uL — ABNORMAL LOW (ref 4.22–5.81)
RDW: 14.3 % (ref 11.5–15.5)
WBC: 10.6 10*3/uL — ABNORMAL HIGH (ref 4.0–10.5)
nRBC: 0 % (ref 0.0–0.2)

## 2020-12-11 LAB — COMPREHENSIVE METABOLIC PANEL
ALT: 24 U/L (ref 0–44)
AST: 31 U/L (ref 15–41)
Albumin: 2.9 g/dL — ABNORMAL LOW (ref 3.5–5.0)
Alkaline Phosphatase: 43 U/L (ref 38–126)
Anion gap: 9 (ref 5–15)
BUN: 71 mg/dL — ABNORMAL HIGH (ref 8–23)
CO2: 27 mmol/L (ref 22–32)
Calcium: 8.5 mg/dL — ABNORMAL LOW (ref 8.9–10.3)
Chloride: 112 mmol/L — ABNORMAL HIGH (ref 98–111)
Creatinine, Ser: 1.14 mg/dL (ref 0.61–1.24)
GFR, Estimated: >60 mL/min (ref >60)
Glucose, Bld: 234 mg/dL — ABNORMAL HIGH (ref 70–99)
Potassium: 4.9 mmol/L (ref 3.5–5.1)
Sodium: 148 mmol/L — ABNORMAL HIGH (ref 135–145)
Total Bilirubin: 0.8 mg/dL (ref 0.3–1.2)
Total Protein: 6.6 g/dL (ref 6.5–8.1)

## 2020-12-11 LAB — C-REACTIVE PROTEIN: CRP: 15.3 mg/dL — ABNORMAL HIGH (ref <1.0)

## 2020-12-11 LAB — GLUCOSE, CAPILLARY
Glucose-Capillary: 208 mg/dL — ABNORMAL HIGH (ref 70–99)
Glucose-Capillary: 208 mg/dL — ABNORMAL HIGH (ref 70–99)
Glucose-Capillary: 172 mg/dL — ABNORMAL HIGH (ref 70–99)
Glucose-Capillary: 205 mg/dL — ABNORMAL HIGH (ref 70–99)

## 2020-12-11 LAB — FIBRIN DERIVATIVES D-DIMER (ARMC ONLY): Fibrin derivatives D-dimer (ARMC): 1553.16 ng/mL (FEU) — ABNORMAL HIGH (ref 0.00–499.00)

## 2020-12-11 NOTE — Progress Notes (Signed)
Attempt to help patient self prone unsuccessful, patient doesn't fully understand how to prone. Patient still unable to understand after this RN educated him. Helped patient into a modified prone to the right.
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
## 2020-12-11 NOTE — Progress Notes (Signed)
[facility] [place]    MR#:  [id number]  Paul Parker:  27-Sep-1941  SUBJECTIVE:  patient came in with increasing shortness of breath and weakness. He was diagnosed with COVID infection January 3. Continue to progressively getting worse currently on 80% FIU two with high flow nasal cannula. Appears very weak and deconditioned.  Weaned down to 40% Fio2/ 35 L/min oxygen Poor appetite, weak  I was able to do facetime with DIL and grandson today  REVIEW OF SYSTEMS:   Review of Systems  Constitutional: Negative for chills, fever and weight loss.  HENT: Negative for ear discharge, ear pain and nosebleeds.   Eyes: Negative for blurred vision, pain and discharge.  Respiratory: Positive for cough and shortness of breath. Negative for sputum production, wheezing and stridor.   Cardiovascular: Negative for chest pain, palpitations, orthopnea and PND.  Gastrointestinal: Negative for abdominal pain, diarrhea, nausea and vomiting.  Genitourinary: Negative for frequency and urgency.  Musculoskeletal: Negative for back pain and joint pain.  Neurological: Positive for weakness. Negative for sensory change, speech change and focal weakness.  Psychiatric/Behavioral: Negative for depression and hallucinations. The patient is not nervous/anxious.    Tolerating Diet: Tolerating PT: pending--pt on HFNC  DRUG ALLERGIES:   Allergies  Allergen Reactions  . Amoxicillin Diarrhea and Nausea And Vomiting    VITALS:  Blood pressure (!) 102/54, pulse (!) 59, temperature 98.1 F (36.7 C), temperature source Oral, resp. rate (!) 21, height 5\' 11"  (1.803 m), weight 74.9 kg, SpO2 96 %.  PHYSICAL EXAMINATION:   Physical Exam  GENERAL:  80 y.o.-year-old patient lying in the bed with moderate respiraotry distress. weak HEENT: Head atraumatic, normocephalic. Oropharynx and nasopharynx clear. HHFNC LUNGS: distant breath sounds bilaterally, no  wheezing, rales, rhonchi. No use of accessory muscles of respiration.  CARDIOVASCULAR: S1, S2 normal. No murmurs, rubs, or gallops. Tachy+ ABDOMEN: Soft, nontender, nondistended. Bowel sounds present. No organomegaly or mass.  EXTREMITIES: No cyanosis, clubbing or edema b/l.    NEUROLOGIC: grossly nonfocal PSYCHIATRIC:  patient is alert and oriented x 2 SKIN: No obvious rash, lesion, or ulcer.   LABORATORY PANEL:  CBC Recent Labs  Lab 12/11/20 0312  WBC 10.6*  HGB 10.1*  HCT 31.2*  PLT 162    Chemistries  Recent Labs  Lab 12/10/20 0412 12/11/20 0312  NA 144 148*  K 4.5 4.9  CL 108 112*  CO2 23 27  GLUCOSE 221* 234*  BUN 71* 71*  CREATININE 1.28* 1.14  CALCIUM 8.4* 8.5*  MG 2.6*  --   AST 27 31  ALT 18 24  ALKPHOS 34* 43  BILITOT 0.8 0.8   Cardiac Enzymes No results for input(s): TROPONINI in the last 168 hours. RADIOLOGY:  No results found. ASSESSMENT AND PLAN:  Dent Plantz is a 80 y.o. 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 Patient was diagnosed with COVID-19 on 11/27/2020. Vaccination: Patient is vaccinated x2 has not received a booster yet.  Acute hypoxic respiratory failure secondary to COVID-19 infection - Remdesivir and Solu-Medrol  - Daily labs: CRP 27.2--27.0 -- D-dimer 2252--1865 --CT chest 1.No pulmonary embolus. 2. Multifocal COVID pneumonia, greatest in the right lower lobe. Parenchymal involvement is moderate to advanced. 3. Multi chamber cardiomegaly post CABG.  Contrast refluxing into the hepatic veins and IVC consistent with elevated right heart pressures. -Pro-Cal was negative at 0.11 d/c abxs --cont to monitor -Tussionex 5 mg p.o. every 12 hours scheduled  ordered -Currently on HFNC40%/35 liters --leucocytosis likely steroid related--improving  Elevated troponin-low clinical suspicion for ACS at this time suspect secondary to demand ischemia in setting of acute hypoxic respiratory failure due to COVID-19 pneumonia -- troponin 83  Right lower extremity swelling -ultrasound bilateral lower extremity for DVT  negative  Hypertension - home antihypertensive including amlodipine 5 mg daily, enalapril 20 mg daily, Imdur 60 mg daily --soft BP--hold amlodipine and enalapril -cont lasix and imdur  Anxiety/depression -on duloxetine 30 mg daily  Hypothyroid - levothyroxine 75 mcg p.o.daily  history of atrial fibrillation in 2013/14 -- currently on amiodarone --HR 70--90's --not on anticoagulation--reason unknown  Palliative care consult appreciated--family conference  1/18   Procedures: Family communication :Dawn (DIL) facetime  on 1/17 Consults : CODE STATUS: full code d/w Patient's daughter-in-law  DVT Prophylaxis : enoxaparin  Status is: Inpatient  Remains inpatient appropriate because:Inpatient level of care appropriate due to severity of illness   Dispo: The patient is from: Home              Anticipated d/c is to: TBD              Anticipated d/c date is: > 3 days              Patient currently is not medically stable to d/c. patient has severe bilateral COVID pneumonia and hypoxic respiratory failure requiring high flow nasal cannula oxygen.  Patient is currently quite sick with covid infection. Discussed with daughter-in-law on the phone.        TOTAL TIME TAKING CARE OF THIS PATIENT: 25 minutes.  >50% time spent on counselling and coordination of care  Note: This dictation was prepared with Dragon dictation along with smaller phrase technology. Any transcriptional errors that result from this process are unintentional.  Fritzi Mandes M.D    [facility]   CC: Primary care physician; Volney American, PA-CPatient ID: Aida Puffer, male   DOB: 10/16/1941, 80 y.o.   MRN: [id number]

## 2020-12-12 DIAGNOSIS — I10 Essential (primary) hypertension: Secondary | ICD-10-CM | POA: Diagnosis not present

## 2020-12-12 DIAGNOSIS — Z515 Encounter for palliative care: Secondary | ICD-10-CM | POA: Diagnosis not present

## 2020-12-12 DIAGNOSIS — I4891 Unspecified atrial fibrillation: Secondary | ICD-10-CM

## 2020-12-12 DIAGNOSIS — U071 COVID-19: Secondary | ICD-10-CM | POA: Diagnosis not present

## 2020-12-12 DIAGNOSIS — Z7189 Other specified counseling: Secondary | ICD-10-CM | POA: Diagnosis not present

## 2020-12-12 DIAGNOSIS — E870 Hyperosmolality and hypernatremia: Secondary | ICD-10-CM | POA: Diagnosis not present

## 2020-12-12 DIAGNOSIS — E43 Unspecified severe protein-calorie malnutrition: Secondary | ICD-10-CM

## 2020-12-12 DIAGNOSIS — E440 Moderate protein-calorie malnutrition: Secondary | ICD-10-CM | POA: Insufficient documentation

## 2020-12-12 LAB — GLUCOSE, CAPILLARY
Glucose-Capillary: 187 mg/dL — ABNORMAL HIGH (ref 70–99)
Glucose-Capillary: 193 mg/dL — ABNORMAL HIGH (ref 70–99)
Glucose-Capillary: 286 mg/dL — ABNORMAL HIGH (ref 70–99)
Glucose-Capillary: 176 mg/dL — ABNORMAL HIGH (ref 70–99)

## 2020-12-12 LAB — COMPREHENSIVE METABOLIC PANEL
ALT: 23 U/L (ref 0–44)
AST: 24 U/L (ref 15–41)
Albumin: 2.7 g/dL — ABNORMAL LOW (ref 3.5–5.0)
Alkaline Phosphatase: 47 U/L (ref 38–126)
Anion gap: 9 (ref 5–15)
BUN: 57 mg/dL — ABNORMAL HIGH (ref 8–23)
CO2: 26 mmol/L (ref 22–32)
Calcium: 8.6 mg/dL — ABNORMAL LOW (ref 8.9–10.3)
Chloride: 114 mmol/L — ABNORMAL HIGH (ref 98–111)
Creatinine, Ser: 0.97 mg/dL (ref 0.61–1.24)
GFR, Estimated: >60 mL/min (ref >60)
Glucose, Bld: 206 mg/dL — ABNORMAL HIGH (ref 70–99)
Potassium: 4.4 mmol/L (ref 3.5–5.1)
Sodium: 149 mmol/L — ABNORMAL HIGH (ref 135–145)
Total Bilirubin: 0.8 mg/dL (ref 0.3–1.2)
Total Protein: 6.3 g/dL — ABNORMAL LOW (ref 6.5–8.1)

## 2020-12-12 LAB — CBC WITH DIFFERENTIAL/PLATELET
Abs Immature Granulocytes: 0.12 10*3/uL — ABNORMAL HIGH (ref 0.00–0.07)
Basophils Absolute: 0 10*3/uL (ref 0.0–0.1)
Basophils Relative: 0 %
Eosinophils Absolute: 0 10*3/uL (ref 0.0–0.5)
Eosinophils Relative: 0 %
HCT: 30.8 % — ABNORMAL LOW (ref 39.0–52.0)
Hemoglobin: 10.2 g/dL — ABNORMAL LOW (ref 13.0–17.0)
Immature Granulocytes: 1 %
Lymphocytes Relative: 4 %
Lymphs Abs: 0.5 10*3/uL — ABNORMAL LOW (ref 0.7–4.0)
MCH: 31.4 pg (ref 26.0–34.0)
MCHC: 33.1 g/dL (ref 30.0–36.0)
MCV: 94.8 fL (ref 80.0–100.0)
Monocytes Absolute: 0.5 10*3/uL (ref 0.1–1.0)
Monocytes Relative: 4 %
Neutro Abs: 9.2 10*3/uL — ABNORMAL HIGH (ref 1.7–7.7)
Neutrophils Relative %: 91 %
Platelets: 169 10*3/uL (ref 150–400)
RBC: 3.25 MIL/uL — ABNORMAL LOW (ref 4.22–5.81)
RDW: 14.3 % (ref 11.5–15.5)
WBC: 10.2 10*3/uL (ref 4.0–10.5)
nRBC: 0 % (ref 0.0–0.2)

## 2020-12-12 LAB — CULTURE, BLOOD (ROUTINE X 2)
Culture: NO GROWTH
Culture: NO GROWTH
Special Requests: ADEQUATE
Special Requests: ADEQUATE

## 2020-12-12 LAB — FIBRIN DERIVATIVES D-DIMER (ARMC ONLY): Fibrin derivatives D-dimer (ARMC): 1668.13 ng/mL (FEU) — ABNORMAL HIGH (ref 0.00–499.00)

## 2020-12-12 LAB — C-REACTIVE PROTEIN: CRP: 10.5 mg/dL — ABNORMAL HIGH (ref <1.0)

## 2020-12-12 MED ORDER — AMLODIPINE BESYLATE 5 MG PO TABS
5.0000 mg | ORAL_TABLET | Freq: Every day | ORAL | Status: DC
  Administered 2020-12-12 – 2020-12-14 (×3): 5 mg via ORAL
  Filled 2020-12-12 (×3): qty 1

## 2020-12-12 MED ORDER — CEFDINIR 300 MG PO CAPS
300.0000 mg | ORAL_CAPSULE | Freq: Two times a day (BID) | ORAL | Status: AC
  Administered 2020-12-13 – 2020-12-16 (×8): 300 mg via ORAL
  Filled 2020-12-12 (×9): qty 1

## 2020-12-12 MED ORDER — BARICITINIB 1 MG PO TABS
4.0000 mg | ORAL_TABLET | Freq: Every day | ORAL | Status: AC
  Administered 2020-12-13 – 2020-12-23 (×11): 4 mg via ORAL
  Filled 2020-12-12 (×2): qty 2
  Filled 2020-12-12 (×4): qty 4
  Filled 2020-12-12: qty 2
  Filled 2020-12-12 (×5): qty 4

## 2020-12-12 MED ORDER — ADULT MULTIVITAMIN W/MINERALS CH
1.0000 | ORAL_TABLET | Freq: Every day | ORAL | Status: DC
  Administered 2020-12-14 – 2020-12-27 (×14): 1 via ORAL
  Filled 2020-12-12 (×15): qty 1

## 2020-12-12 MED ORDER — CEFDINIR 300 MG PO CAPS
300.0000 mg | ORAL_CAPSULE | Freq: Two times a day (BID) | ORAL | Status: DC
  Filled 2020-12-12: qty 1

## 2020-12-12 MED ORDER — ENALAPRIL MALEATE 20 MG PO TABS
20.0000 mg | ORAL_TABLET | Freq: Every day | ORAL | Status: DC
  Administered 2020-12-12 – 2020-12-14 (×3): 20 mg via ORAL
  Filled 2020-12-12 (×3): qty 1

## 2020-12-12 MED ORDER — ENSURE ENLIVE PO LIQD
237.0000 mL | Freq: Two times a day (BID) | ORAL | Status: DC
  Administered 2020-12-14 – 2020-12-19 (×8): 237 mL via ORAL

## 2020-12-12 MED ORDER — SODIUM CHLORIDE 0.45 % IV SOLN: INTRAVENOUS | Status: DC

## 2020-12-12 NOTE — Progress Notes (Addendum)
Daily Progress Note   Patient Name: Paul Parker       Date: 12/12/2020 DOB: [birth year]  Age: 80 y.o. MRN#: [id number] Attending Physician: Fritzi Mandes, MD Primary Care Physician: Volney American, PA-C Admit Date: 12/26/2020  Reason for Consultation/Follow-up: Establishing goals of care  Subjective: Patient is resting in bed with eyes closed and high flow cannula in place.  Called his daughter-in-law Paul Parker as planned.  She was able to get her husband Paul Parker, her brother-in-law Paul Parker, and her mother-in-law Paul Parker on a conference call. They state they have tried to call and check on Paul Parker, but he is so hard of hearing, they have had difficulty communicating, and he begins to cough with trying to talk.  They discussed his baseline cognitive function.  Prior to [place] he was fully physically independent.  We discussed his diagnoses, prognosis, GOC, EOL wishes disposition and options.  Created space and opportunity for patient  to explore thoughts and feelings regarding current medical information.   A detailed discussion was had today regarding advanced directives.  Concepts specific to code status, artifical feeding and hydration, IV antibiotics and rehospitalization were discussed.  The difference between an aggressive medical intervention path and a comfort care path was discussed.  Values and goals of care important to patient and family were attempted to be elicited.  Discussed limitations of medical interventions to prolong quality of life in some situations and discussed the concept of human mortality.  They state they do not want him to suffer but wife feels like he needs to have aggressive interventions longer.  Patient's wife discusses feeling guilt that she called 911 twice last week  and was told that her husband did not need to go to the hospital, that he needed to stay at home.  She states she feels that had he come to the hospital last week he would not be in the situation he is in now.    At this time they would not want chest compressions to try to restart his heart if he was in cardiopulmonary arrest.  At this time they would want him placed on a ventilator if needed for respiratory distress not amenable to BiPAP.  They will discuss thoughts on nutrition as they understand he is not eating well.  They will talk further as a family about  care moving forward.  Discussed completing a MOST form with the family members.  Wife Paul Parker does not have a smart phone to complete the form through [employer].  She is not in the same location as son Paul Parker who does have a smart phone.  She requests that Paul Parker complete the form on her behalf.  All family members were on the phone during the completion of the MOST form.    I completed a MOST form today through [employer] and the signed original was placed in the chart. A photocopy was placed in the chart to be scanned into EMR. The patient outlined their wishes for the following treatment decisions:  Cardiopulmonary Resuscitation: Do Not Attempt Resuscitation (DNR/No CPR)  Medical Interventions: Full Scope of Treatment: Use intubation, advanced airway interventions, mechanical ventilation, cardioversion as indicated, medical treatment, IV fluids, etc, also provide comfort measures. Transfer to the hospital if indicated  Antibiotics: Antibiotics if indicated  IV Fluids: IV fluids if indicated  Feeding Tube: Left Blank     Length of Stay: 5  Current Medications: Scheduled Meds:  . amiodarone  100 mg Oral Daily  . amLODipine  5 mg Oral Daily  . aspirin EC  81 mg Oral Daily  . atorvastatin  40 mg Oral Daily  . [START ON 12/13/2020] baricitinib  4 mg Oral Daily  . [START ON 12/13/2020] cefdinir  300 mg Oral Q12H  . Chlorhexidine Gluconate Cloth   6 each Topical Q0600  . chlorpheniramine-HYDROcodone  5 mL Oral Q12H  . clopidogrel  75 mg Oral Daily  . DULoxetine  30 mg Oral Daily  . enalapril  20 mg Oral Daily  . enoxaparin (LOVENOX) injection  40 mg Subcutaneous Q24H  . ferrous sulfate  325 mg Oral Q breakfast  . insulin aspart  0-15 Units Subcutaneous TID WC  . insulin aspart  0-5 Units Subcutaneous QHS  . insulin glargine  10 Units Subcutaneous Daily  . isosorbide mononitrate  60 mg Oral Daily  . levothyroxine  75 mcg Oral Q0600  . predniSONE  50 mg Oral Daily    Continuous Infusions: . sodium chloride 50 mL/hr at 12/12/20 1336    PRN Meds: acetaminophen, guaiFENesin-dextromethorphan, ondansetron **OR** ondansetron (ZOFRAN) IV  Physical Exam Constitutional:      Comments: Eyes closed.   Pulmonary:     Effort: Pulmonary effort is normal.     Comments: High flow cannula            Vital Signs: BP 124/65   Pulse 71   Temp 98.3 F (36.8 C)   Resp (!) 23   Ht 5\' 11"  (1.803 m)   Wt 74.9 kg   SpO2 99%   BMI 23.03 kg/m  SpO2: SpO2: 99 % O2 Device: O2 Device: High Flow Nasal Cannula O2 Flow Rate: O2 Flow Rate (L/min): 40 L/min  Intake/output summary:   Intake/Output Summary (Last 24 hours) at 12/12/2020 1549 Last data filed at 12/12/2020 1003 Gross per 24 hour  Intake 340 ml  Output 1700 ml  Net -1360 ml   LBM: Last BM Date: 12/10/20 Baseline Weight: Weight: 79.4 kg Most recent weight: Weight: 74.9 kg         Patient Active Problem List   Diagnosis Date Noted  . Hypernatremia   . Atrial fibrillation ([facility])   . Swelling of lower leg   . Acute hypoxemic respiratory failure due to COVID-19 ([facility]) 12/21/2020  . Prolonged QT interval 12/19/2020  . SSS (sick sinus syndrome) ([facility]) 04/05/2020  .  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 Memory changes 12/02/2019  . Recurrent major depressive disorder, in full remission ([place]) 09/30/2018  . Anemia 09/30/2018  . Seborrheic keratosis 04/27/2018  . Advanced care  planning/counseling discussion 10/28/2017  . Senile purpura ([place]) 08/30/2015  . CAD (coronary artery disease) 08/30/2015  . Concussion with coma 08/30/2015  . BPH (benign prostatic hyperplasia) 08/30/2015  . Hypothyroidism 08/30/2015  . Depression, recurrent ([place]) 08/30/2015  . Diabetes mellitus associated with hormonal etiology ([place])   . Hyperlipidemia   . Primary hypertension     Palliative Care Assessment & Plan    Recommendations/Plan:  No CPR  Intubate if needed for respiratory distress/failure  Call family again tomorrow 2:00    Code Status:    Code Status Orders  (From admission, onward)         Start     Ordered   12/12/20 1542  Limited resuscitation (code)  Continuous       Question Answer Comment  In the event of cardiac or respiratory ARREST: Initiate Code Blue, Call Rapid Response Yes   In the event of cardiac or respiratory ARREST: Perform CPR No   In the event of cardiac or respiratory ARREST: Perform Intubation/Mechanical Ventilation Yes   In the event of cardiac or respiratory ARREST: Use NIPPV/BiPAp only if indicated Yes   In the event of cardiac or respiratory ARREST: Administer ACLS medications if indicated Yes   In the event of cardiac or respiratory ARREST: Perform Defibrillation or Cardioversion if indicated Yes   Comments Intubate for respiratory distress/failure.      12/12/20 1544        Code Status History    Date Active Date Inactive Code Status Order ID Comments User Context   2020/12/21 1638 12/12/2020 1544 Full Code [id number]  [facility], DO ED   Advance Care Planning Activity       Prognosis:  Poor    Care plan was discussed with primary MD and RN  Thank you for allowing the Palliative Medicine Team to assist in the care of this patient.   Total Time 35 min Prolonged Time Billed no      Greater than 50%  of this time was spent counseling and coordinating care related to the above assessment and plan.  Asencion Gowda, NP  Please contact Palliative Medicine Team phone at [phone] for questions and concerns.

## 2020-12-12 NOTE — Progress Notes (Signed)
Initial Nutrition Assessment  DOCUMENTATION CODES:   Non-severe (moderate) malnutrition in context of social or environmental circumstances  INTERVENTION:  Provide Ensure Enlive po BID, each supplement provides 350 kcal and 20 grams of protein.  Provide Magic cup BID with lunch and dinner, each supplement provides 290 kcal and 9 grams of protein.  Provide MVI po daily.  Pt would benefit from nutrient dense supplement. Given pt's hx of DM, RD will reassess adequacy of PO intake, CBGS, and adjust supplement regimen as appropriate at follow-up.   NUTRITION DIAGNOSIS:   Moderate Malnutrition related to social / environmental circumstances (suspected inadequate oral intake now complicated by acute TMLYY-50 infection) as evidenced by moderate fat depletion,moderate muscle depletion,severe muscle depletion.  GOAL:   Patient will meet greater than or equal to 90% of their needs  MONITOR:   PO intake,Supplement acceptance,Labs,Weight trends,I & O's  REASON FOR ASSESSMENT:   Consult Assessment of nutrition requirement/status  ASSESSMENT:   80 year old male with PMHx of CAD, DM, HLD, HTN admitted with COVID-19.   1/18 diet downgraded to dysphagia 3 with thin by SLP  Met with patient at bedside. He reports he has had a decreased appetite and intake since he became sick with COVID. He reports he is trying to eat his meals but is having difficulty due to SOB and early satiety. He is unable to describe usual intake at baseline. Per chart patient ate 15% of lunch on 1/16, 25% of dinner on 1/17. He had finished about 50% of lunch tray at time of RD assessment. Discussed increased nutrient needs in setting of catabolic nature of PTWSF-68. Patient is amenable to drinking ONS to help meet calorie/protein needs.  Patient reports his UBW was around 170 lbs. Per chart patient was 78.2 kg (172.04 lbs) on 07/06/2020. He is currently documented to be 74.9 kg (165.12 lbs). Patient has lost 3.3 kg (4.2%  body weight) likely since 1/3, which would be significant for time frame. However, patient is unsure of exact time frame for weight loss.  Medications reviewed and include: ferrous sulfate 325 mg daily, Novolog 0-15 units TID, Novolog 0-5 units QHS, Lantus 10 units daily, levothyroxine, prednisone 50 mg daily, 1/2NS at 50 mL/hr.  Labs reviewed: CBG 172-205, Sodium 149, Chloride 114, BUN 57.   Discussed with RN.  NUTRITION - FOCUSED PHYSICAL EXAM:  Flowsheet Row Most Recent Value  Orbital Region Moderate depletion  Upper Arm Region Severe depletion  Thoracic and Lumbar Region Moderate depletion  Buccal Region Moderate depletion  Temple Region Severe depletion  Clavicle Bone Region Severe depletion  Clavicle and Acromion Bone Region Severe depletion  Scapular Bone Region Moderate depletion  Dorsal Hand Severe depletion  Patellar Region Moderate depletion  Anterior Thigh Region Moderate depletion  Posterior Calf Region Severe depletion  Edema (RD Assessment) None  Hair Reviewed  Eyes Reviewed  Mouth Reviewed  Skin Reviewed  Nails Reviewed     Diet Order:   Diet Order            DIET DYS 3 Room service appropriate? Yes with Assist; Fluid consistency: Thin  Diet effective now                EDUCATION NEEDS:   No education needs have been identified at this time  Skin:  Skin Assessment: Reviewed RN Assessment  Last BM:  12/10/2020 per chart  Height:   Ht Readings from Last 1 Encounters:  12/10/20 _0  (1.803 m)   Weight:   Wt Readings  from Last 1 Encounters:  12/10/20 74.9 kg   BMI:  Body mass index is 23.03 kg/m.  Estimated Nutritional Needs:   Kcal:  1950-2150  Protein:  98-108 grams  Fluid:  1.9 L/day  Jacklynn Barnacle, MS, RD, LDN Pager number available on Amion
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
## 2020-12-12 NOTE — Progress Notes (Signed)
Patient alert with no complaints of pain or shortness of breath. Patient tolerating mechanical soft diet. Adequate urine output. Patient pending tx to floor.

## 2020-12-12 NOTE — Progress Notes (Addendum)
[facility] at [place]    MR#:  [id number]  [place]:  May 04, 1941  SUBJECTIVE:  patient came in with increasing shortness of breath and weakness. He was diagnosed with COVID infection January 3. Continue to progressively getting worse currently on 80% FIU two with high flow nasal cannula. Appears very weak and deconditioned.  Weaned down to 40% Fio2/ 35 L/min oxygen Poor appetite, weak, hard on hearing  RN felt pt was having difficulty with swallowing his pills. ST to see  REVIEW OF SYSTEMS:   Review of Systems  Constitutional: Negative for chills, fever and weight loss.  HENT: Negative for ear discharge, ear pain and nosebleeds.   Eyes: Negative for blurred vision, pain and discharge.  Respiratory: Positive for cough and shortness of breath. Negative for sputum production, wheezing and stridor.   Cardiovascular: Negative for chest pain, palpitations, orthopnea and PND.  Gastrointestinal: Negative for abdominal pain, diarrhea, nausea and vomiting.  Genitourinary: Negative for frequency and urgency.  Musculoskeletal: Negative for back pain and joint pain.  Neurological: Positive for weakness. Negative for sensory change, speech change and focal weakness.  Psychiatric/Behavioral: Negative for depression and hallucinations. The patient is not nervous/anxious.    Tolerating Diet: Tolerating PT: pending--pt on HFNC  DRUG ALLERGIES:   Allergies  Allergen Reactions  . Amoxicillin Diarrhea and Nausea And Vomiting    VITALS:  Blood pressure (!) 151/79, pulse (!) 58, temperature 98.3 F (36.8 C), resp. rate (!) 26, height 5\' 11"  (1.803 m), weight 74.9 kg, SpO2 (!) 86 %.  PHYSICAL EXAMINATION:   Physical Exam  GENERAL:  80 y.o.-year-old patient lying in the bed with moderate respiraotry distress. Weak. Thin. fraile HEENT: Head atraumatic, normocephalic. Oropharynx and nasopharynx clear. HHFNC LUNGS: distant breath  sounds bilaterally, no wheezing, rales, rhonchi. No use of accessory muscles of respiration.  CARDIOVASCULAR: S1, S2 normal. No murmurs, rubs, or gallops. Tachy+ ABDOMEN: Soft, nontender, nondistended. Bowel sounds present. No organomegaly or mass.  EXTREMITIES: No cyanosis, clubbing or edema b/l.    NEUROLOGIC: grossly nonfocal PSYCHIATRIC:  patient is alert and oriented x 2 SKIN: No obvious rash, lesion, or ulcer.   LABORATORY PANEL:  CBC Recent Labs  Lab 12/12/20 0315  WBC 10.2  HGB 10.2*  HCT 30.8*  PLT 169    Chemistries  Recent Labs  Lab 12/10/20 0412 12/11/20 0312 12/12/20 0315  NA 144   < > 149*  K 4.5   < > 4.4  CL 108   < > 114*  CO2 23   < > 26  GLUCOSE 221*   < > 206*  BUN 71*   < > 57*  CREATININE 1.28*   < > 0.97  CALCIUM 8.4*   < > 8.6*  MG 2.6*  --   --   AST 27   < > 24  ALT 18   < > 23  ALKPHOS 34*   < > 47  BILITOT 0.8   < > 0.8   < > = values in this interval not displayed.   Cardiac Enzymes No results for input(s): TROPONINI in the last 168 hours. RADIOLOGY:  No results found. ASSESSMENT AND PLAN:  Paul Parker is a 80 y.o. male with medical history significant for hypertension, hyperlipidemia, coronary artery disease, history of prostate cancer, hypothyroid, depression/anxiety, history of atrial fibrillation converted to sinus rhythm on amiodarone in 2013/2014, presented to the emergency department for chief concerns of worsening shortness  of breath. Patient was diagnosed with COVID-19 on 11/27/2020. Vaccination: Patient is vaccinated x2 has not received a booster yet.  Acute hypoxic respiratory failure secondary to COVID-19 infection - Remdesivir and Solu-Medrol  - Daily labs: CRP 27.2--27.0 -- D-dimer 2252--1865 --CT chest 1.No pulmonary embolus. 2. Multifocal COVID pneumonia, greatest in the right lower lobe. Parenchymal involvement is moderate to advanced. 3. Multi chamber cardiomegaly post CABG. Contrast refluxing into the hepatic veins  and IVC consistent with elevated right heart pressures. -Pro-Cal was negative at 0.11 d/c abxs --cont to monitor -Tussionex 5 mg p.o. every 12 hours scheduled ordered -Currently on HFNC40%/35 liters --leucocytosis likely steroid related--improving --Empiric po abx given severity of PNA--cefuroxime for total 7 days  Elevated troponin-low clinical suspicion for ACS at this time suspect secondary to demand ischemia in setting of acute hypoxic respiratory failure due to COVID-19 pneumonia -- troponin 83 --ASA + Plavix  Hypernatremia/hyperchloremia/poor po intake --hold lasix --start IV 1/2 NS--follow daily BMP  Right lower extremity swelling -ultrasound bilateral lower extremity for DVT is negative  Hypertension - (home antihypertensive including amlodipine, enalapril,Imdur ) --resumed BP meds  Anxiety/depression -on duloxetine 30 mg daily  Hypothyroid - levothyroxine 75 mcg p.o.daily  history of atrial fibrillation in 2013/14 -- currently on amiodarone --HR 70--90's --not on anticoagulation  Malnutrition/underweight/Poor po intake Nutrition Status: Nutrition Problem: Moderate Malnutrition Etiology: social / environmental circumstances (suspected inadequate oral intake now complicated by acute TKZSW-10 infection) Signs/Symptoms: moderate fat depletion,moderate muscle depletion,severe muscle depletion Interventions: Refer to RD note for recommendations   Palliative care consult appreciated--family conference  1/18   Procedures: Family communication :Dawn (DIL) facetime  on 1/17 Consults : CODE STATUS: full code d/w Patient's daughter-in-law  DVT Prophylaxis : enoxaparin  Status is: Inpatient  Remains inpatient appropriate because:Inpatient level of care appropriate due to severity of illness   Dispo: The patient is from: Home              Anticipated d/c is to: TBD              Anticipated d/c date is: > 3 days              Patient currently is not  medically stable to d/c. patient has severe bilateral COVID pneumonia and hypoxic respiratory failure requiring high flow nasal cannula oxygen.  Patient is currently quite sick with covid infection. Discussed with daughter-in-law on the phone.        TOTAL TIME TAKING CARE OF THIS PATIENT: 25 minutes.  >50% time spent on counselling and coordination of care  Note: This dictation was prepared with Dragon dictation along with smaller phrase technology. Any transcriptional errors that result from this process are unintentional.  Fritzi Mandes M.D    [facility]   CC: Primary care physician; Volney American, PA-CPatient ID: Paul Parker, male   DOB: Dec 07, 1940, 80 y.o.   MRN: [id number]

## 2020-12-13 DIAGNOSIS — J9601 Acute respiratory failure with hypoxia: Secondary | ICD-10-CM | POA: Diagnosis not present

## 2020-12-13 DIAGNOSIS — E440 Moderate protein-calorie malnutrition: Secondary | ICD-10-CM | POA: Insufficient documentation

## 2020-12-13 DIAGNOSIS — Z7189 Other specified counseling: Secondary | ICD-10-CM | POA: Diagnosis not present

## 2020-12-13 DIAGNOSIS — E870 Hyperosmolality and hypernatremia: Secondary | ICD-10-CM | POA: Diagnosis not present

## 2020-12-13 DIAGNOSIS — U071 COVID-19: Secondary | ICD-10-CM | POA: Diagnosis not present

## 2020-12-13 DIAGNOSIS — Z515 Encounter for palliative care: Secondary | ICD-10-CM | POA: Diagnosis not present

## 2020-12-13 LAB — BASIC METABOLIC PANEL
Anion gap: 10 (ref 5–15)
BUN: 41 mg/dL — ABNORMAL HIGH (ref 8–23)
CO2: 28 mmol/L (ref 22–32)
Calcium: 8.8 mg/dL — ABNORMAL LOW (ref 8.9–10.3)
Chloride: 110 mmol/L (ref 98–111)
Creatinine, Ser: 0.95 mg/dL (ref 0.61–1.24)
GFR, Estimated: >60 mL/min (ref >60)
Glucose, Bld: 133 mg/dL — ABNORMAL HIGH (ref 70–99)
Potassium: 4 mmol/L (ref 3.5–5.1)
Sodium: 148 mmol/L — ABNORMAL HIGH (ref 135–145)

## 2020-12-13 LAB — GLUCOSE, CAPILLARY
Glucose-Capillary: 84 mg/dL (ref 70–99)
Glucose-Capillary: 165 mg/dL — ABNORMAL HIGH (ref 70–99)
Glucose-Capillary: 177 mg/dL — ABNORMAL HIGH (ref 70–99)
Glucose-Capillary: 201 mg/dL — ABNORMAL HIGH (ref 70–99)

## 2020-12-13 NOTE — Plan of Care (Signed)
Pt remains on HFNC for support during covid infection. Did tolerate weaning from 60L at 85% to 45L at 65%. Able to feed self once set up, taking frequent breaks and only eating approx 25% of meals. Pt was having difficulty this am swallowing pills whole with applesauce; did much better when crushed and mixed in apple sauce. Pt voiding adequately using primafit. Patient was able to speak to wife over phone briefly today. Dawn, daughter in law, also updated over phone. EKG done for prolonged QT. VSS.    Problem: Elimination: Goal: Will not experience complications related to bowel motility Outcome: Progressing Goal: Will not experience complications related to urinary retention Outcome: Progressing   Problem: Coping: Goal: Psychosocial and spiritual needs will be supported Outcome: Progressing   Problem: Respiratory: Goal: Will maintain a patent airway Outcome: Progressing Goal: Complications related to the disease process, condition or treatment will be avoided or minimized Outcome: Progressing   Problem: Education: Goal: Knowledge of General Education information will improve Description: Including pain rating scale, medication(s)/side effects and non-pharmacologic comfort measures Outcome: Not Progressing   Problem: Health Behavior/Discharge Planning: Goal: Ability to manage health-related needs will improve Outcome: Not Progressing   Problem: Clinical Measurements: Goal: Ability to maintain clinical measurements within normal limits will improve Outcome: Not Progressing Goal: Will remain free from infection Outcome: Not Progressing Goal: Diagnostic test results will improve Outcome: Not Progressing Goal: Respiratory complications will improve Outcome: Not Progressing Goal: Cardiovascular complication will be avoided Outcome: Not Progressing   Problem: Activity: Goal: Risk for activity intolerance will decrease Outcome: Not Progressing   Problem: Nutrition: Goal:  Adequate nutrition will be maintained Outcome: Not Progressing   Problem: Coping: Goal: Level of anxiety will decrease Outcome: Not Progressing   Problem: Pain Managment: Goal: General experience of comfort will improve Outcome: Not Progressing   Problem: Safety: Goal: Ability to remain free from injury will improve Outcome: Not Progressing   Problem: Skin Integrity: Goal: Risk for impaired skin integrity will decrease Outcome: Not Progressing   Problem: Education: Goal: Knowledge of risk factors and measures for prevention of condition will improve Outcome: Not Progressing

## 2020-12-13 NOTE — Progress Notes (Signed)
Daily Progress Note   Patient Name: Paul Parker       Date: 12/13/2020 DOB: [birth year]  Age: 80 y.o. MRN#: [id number] Attending Physician: Jennye Boroughs, MD Primary Care Physician: Volney American, PA-C Admit Date: 12/31/20  Reason for Consultation/Follow-up: Establishing goals of care  Subjective: Patient is sitting in bed with high flow cannula in place and eyes closed.  He has lunch sitting in front of him which does not appear to have been touched.  Oxygen has been increased both in liters per minute and percentage overnight.    Called to speak with family contact [place], who conference called patient's sons and wife.  Discussed his status.  Patient's sons are very supportive with allowing their mother, the patient's wife to make decisions.  Wife discusses that she understands he could require ventilator support, and could also die over the next couple of days, and if that were to happen then that would be God's will.  She states that she would like to give him another couple of days to see if he improves.  Patient's family agrees with this.  Palliative medicine will reach back out to family on Friday.  Family is aware that staff will call if patient has acute decline.   Length of Stay: 6  Current Medications: Scheduled Meds:  . amiodarone  100 mg Oral Daily  . amLODipine  5 mg Oral Daily  . aspirin EC  81 mg Oral Daily  . atorvastatin  40 mg Oral Daily  . baricitinib  4 mg Oral Daily  . cefdinir  300 mg Oral Q12H  . Chlorhexidine Gluconate Cloth  6 each Topical Q0600  . chlorpheniramine-HYDROcodone  5 mL Oral Q12H  . clopidogrel  75 mg Oral Daily  . DULoxetine  30 mg Oral Daily  . enalapril  20 mg Oral Daily  . enoxaparin (LOVENOX) injection  40 mg Subcutaneous Q24H  . feeding  supplement  237 mL Oral BID BM  . ferrous sulfate  325 mg Oral Q breakfast  . insulin aspart  0-15 Units Subcutaneous TID WC  . insulin aspart  0-5 Units Subcutaneous QHS  . insulin glargine  10 Units Subcutaneous Daily  . isosorbide mononitrate  60 mg Oral Daily  . levothyroxine  75 mcg Oral Q0600  . multivitamin with minerals  1 tablet  Oral Daily  . predniSONE  50 mg Oral Daily    Continuous Infusions:   PRN Meds: acetaminophen, guaiFENesin-dextromethorphan, ondansetron **OR** ondansetron (ZOFRAN) IV  Physical Exam Constitutional:      Comments: Eyes closed.   Pulmonary:     Comments: High flow cannula in place            Vital Signs: BP 122/64   Pulse (!) 48   Temp 97.6 F (36.4 C) (Oral)   Resp (!) 26   Ht 5\' 11"  (1.803 m)   Wt 74.9 kg   SpO2 95%   BMI 23.03 kg/m  SpO2: SpO2: 95 % O2 Device: O2 Device: High Flow Nasal Cannula O2 Flow Rate: O2 Flow Rate (L/min): 45 L/min  Intake/output summary:   Intake/Output Summary (Last 24 hours) at 12/13/2020 1459 Last data filed at 12/13/2020 1237 Gross per 24 hour  Intake 1375 ml  Output 1950 ml  Net -575 ml   LBM: Last BM Date: 12/10/20 Baseline Weight: Weight: 79.4 kg Most recent weight: Weight: 74.9 kg           Patient Active Problem List   Diagnosis Date Noted  . Malnutrition of moderate degree 12/13/2020  . Hypernatremia   . Atrial fibrillation ([place])   . Severe protein-calorie malnutrition ([place])   . Swelling of lower leg   . Acute hypoxemic respiratory failure due to COVID-19 ([place]) 11/26/2020  . Prolonged QT interval 12/01/2020  . SSS (sick sinus syndrome) ([place]) 04/05/2020  . 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 Memory changes 12/02/2019  . Recurrent major depressive disorder, in full remission ([place]) 09/30/2018  . Anemia 09/30/2018  . Seborrheic keratosis 04/27/2018  . Advanced care planning/counseling discussion 10/28/2017  . Senile purpura ([place]) 08/30/2015  . CAD (coronary artery disease)  08/30/2015  . Concussion with coma 08/30/2015  . BPH (benign prostatic hyperplasia) 08/30/2015  . Hypothyroidism 08/30/2015  . Depression, recurrent ([place]) 08/30/2015  . Diabetes mellitus associated with hormonal etiology ([place])   . Hyperlipidemia   . Primary hypertension     Palliative Care Assessment & Plan   Recommendations/Plan:  Continue current care.  Family would like to follow-up on Friday.    Code Status:    Code Status Orders  (From admission, onward)         Start     Ordered   12/12/20 1542  Limited resuscitation (code)  Continuous       Question Answer Comment  In the event of cardiac or respiratory ARREST: Initiate Code Blue, Call Rapid Response Yes   In the event of cardiac or respiratory ARREST: Perform CPR No   In the event of cardiac or respiratory ARREST: Perform Intubation/Mechanical Ventilation Yes   In the event of cardiac or respiratory ARREST: Use NIPPV/BiPAp only if indicated Yes   In the event of cardiac or respiratory ARREST: Administer ACLS medications if indicated Yes   In the event of cardiac or respiratory ARREST: Perform Defibrillation or Cardioversion if indicated Yes   Comments Intubate for respiratory distress/failure.      12/12/20 1544        Code Status History    Date Active Date Inactive Code Status Order ID Comments User Context   12/15/2020 [id number] 12/12/2020 1544 Full Code [id number]  CoxBriant Cedar, DO ED   Advance Care Planning Activity       Prognosis:  Very poor    Care plan was discussed with primary RN Aracelys Glade  Thank you for allowing the Palliative  Medicine Team to assist in the care of this patient.   Total Time 15 min Prolonged Time Billed  no      Greater than 50%  of this time was spent counseling and coordinating care related to the above assessment and plan.  Asencion Gowda, NP  Please contact Palliative Medicine Team phone at [phone] for questions and concerns.

## 2020-12-13 NOTE — Progress Notes (Signed)
Progress Note    Paul Parker  [id number] DOB: 1941-11-21  DOA: 12/18/2020 PCP: Volney American, PA-C      Brief Narrative:    Medical records reviewed and are as summarized below:  Paul Parker is a 80 y.o. male medical history significant forhypertension, hyperlipidemia, coronary artery disease, history of prostate cancer, hypothyroid, depression/anxiety, history of atrial fibrillation converted to sinus rhythm on amiodarone in2013/2014, presented to the emergency department for chief concerns of worsening shortness of breath. He was diagnosed with COVID-19 on 11/27/2020.  He has received first and second dose of COVID-vaccine but has not received a booster yet.   He was admitted to the hospital for [id number] pneumonia complicated by acute hypoxic respiratory failure.   Assessment/Plan:   Active Problems:   Primary hypertension   Acute hypoxemic respiratory failure due to COVID-19 (HCC)   Prolonged QT interval   Hypernatremia   Atrial fibrillation (H[facility])   Severe protein-calorie malnutrition (HCC)   Malnutrition of moderate degree   Nutrition Problem: Moderate Malnutrition Etiology: social / environmental circumstances (suspected inadequate oral intake now complicated by acute [id number] infection)  Signs/Symptoms: moderate fat depletion,moderate muscle depletion,severe muscle depletion   Body mass index is 23.03 kg/m.    PLAN  COVID-19 pneumonia: Completed IV remdesivir on 12/11/2020.  Continue prednisone, baricitinib and Omnicef.  No evidence of pulmonary embolism on CTA chest.  Severe acute hypoxic respiratory failure: He is requiring oxygen via heated humidified high flow nasal cannula (FiO2 65% and 45 L/min).  CAD: Continue aspirin, Plavix and Lipitor  Paroxysmal atrial fibrillation: Continue amiodarone  Hypernatremia: Discontinue IV fluids to avoid fluid overload.  Monitor sodium level closely.  Encouraged oral hydration.  Right lower extremity  swelling: No evidence of DVT on venous duplex.  Elevated troponin: This likely due to demand ischemia.  Comorbidities include anxiety, depression, hypothyroidism   Appreciate input from palliative care.   Plan discussed with Ms. Othelia Pulling, his daughter-in-law  Diet Order            DIET DYS 3 Room service appropriate? Yes with Assist; Fluid consistency: Thin  Diet effective now                    Consultants:  Palliative care team  Procedures:  None    Medications:   . amiodarone  100 mg Oral Daily  . amLODipine  5 mg Oral Daily  . aspirin EC  81 mg Oral Daily  . atorvastatin  40 mg Oral Daily  . baricitinib  4 mg Oral Daily  . cefdinir  300 mg Oral Q12H  . Chlorhexidine Gluconate Cloth  6 each Topical Q0600  . chlorpheniramine-HYDROcodone  5 mL Oral Q12H  . clopidogrel  75 mg Oral Daily  . DULoxetine  30 mg Oral Daily  . enalapril  20 mg Oral Daily  . enoxaparin (LOVENOX) injection  40 mg Subcutaneous Q24H  . feeding supplement  237 mL Oral BID BM  . ferrous sulfate  325 mg Oral Q breakfast  . insulin aspart  0-15 Units Subcutaneous TID WC  . insulin aspart  0-5 Units Subcutaneous QHS  . insulin glargine  10 Units Subcutaneous Daily  . isosorbide mononitrate  60 mg Oral Daily  . levothyroxine  75 mcg Oral Q0600  . multivitamin with minerals  1 tablet Oral Daily  . predniSONE  50 mg Oral Daily   Continuous Infusions:   Anti-infectives (From admission, onward)   Start  Dose/Rate Route Frequency Ordered Stop   12/13/20 0800  cefdinir (OMNICEF) capsule 300 mg        300 mg Oral Every 12 hours 12/12/20 1146 12/17/20 0959   12/12/20 1800  cefdinir (OMNICEF) capsule 300 mg  Status:  Discontinued        300 mg Oral Every 12 hours 12/12/20 1135 12/12/20 1146   12/10/20 1000  cefTRIAXone (ROCEPHIN) 1 g in sodium chloride 0.9 % 100 mL IVPB  Status:  Discontinued        1 g 200 mL/hr over 30 Minutes Intravenous Every 24 hours 12/10/20 0911 12/12/20 1135    12/08/20 1000  remdesivir 100 mg in sodium chloride 0.9 % 100 mL IVPB       "Followed by" Linked Group Details   100 mg 200 mL/hr over 30 Minutes Intravenous Daily 12/05/2020 1338 12/11/20 1020   12/11/2020 2345  doxycycline (VIBRAMYCIN) 100 mg in sodium chloride 0.9 % 250 mL IVPB  Status:  Discontinued        100 mg 125 mL/hr over 120 Minutes Intravenous Every 12 hours 12/04/2020 2311 12/08/20 1053   11/28/2020 2315  azithromycin (ZITHROMAX) 500 mg in sodium chloride 0.9 % 250 mL IVPB  Status:  Discontinued        500 mg 250 mL/hr over 60 Minutes Intravenous Every 24 hours 12/06/2020 2307 11/25/2020 2311   11/25/2020 2315  cefTRIAXone (ROCEPHIN) 1 g in sodium chloride 0.9 % 100 mL IVPB  Status:  Discontinued        1 g 200 mL/hr over 30 Minutes Intravenous Every 24 hours 12/02/2020 2307 12/08/20 1053   12/18/2020 1500  remdesivir 200 mg in sodium chloride 0.9% 250 mL IVPB       "Followed by" Linked Group Details   200 mg 580 mL/hr over 30 Minutes Intravenous Once 12/24/2020 1338 12/16/2020 1455             Family Communication/Anticipated D/C date and plan/Code Status   DVT prophylaxis: enoxaparin (LOVENOX) injection 40 mg Start: 11/26/2020 2200 Place TED hose Start: 12/02/2020 1632     Code Status: Partial Code: Okay with intubation and mechanical ventilation but no chest compressions  Family Communication: Plan discussed with Ms. Othelia Pulling, his daughter-in-law Disposition Plan:    Status is: Inpatient  Remains inpatient appropriate because:Unsafe d/c plan and Inpatient level of care appropriate due to severity of illness   Dispo: The patient is from: Home              Anticipated d/c is to: SNF              Anticipated d/c date is: > 3 days              Patient currently is not medically stable to d/c.           Subjective:   Interval events noted.  C/o shortness of breath and generalized weakness  Objective:    Vitals:   12/13/20 1200 12/13/20 1300 12/13/20 1400 12/13/20  1415  BP: 115/79 127/84 122/64   Pulse: 83 81 (!) 48   Resp: (!) 35 (!) 27 (!) 26   Temp: 97.6 F (36.4 C)     TempSrc: Oral     SpO2: (!) 86% 91% 94% 95%  Weight:      Height:       No data found.   Intake/Output Summary (Last 24 hours) at 12/13/2020 1600 Last data filed at 12/13/2020 1237 Gross per 24 hour  Intake 1375 ml  Output 1950 ml  Net -575 ml   Filed Weights   12/16/2020 1225 12/10/20 0902  Weight: 79.4 kg 74.9 kg    Exam:  GEN: NAD SKIN: No rash EYES: EOMI ENT: MMM CV: RRR PULM: bibasilar rales. No wheezing ABD: soft, ND, NT, +BS CNS: AAO x 3, non focal EXT: No edema or tenderness    Data Reviewed:   I have personally reviewed following labs and imaging studies:  Labs: Labs show the following:   Basic Metabolic Panel: Recent Labs  Lab 12/09/20 0541 12/10/20 0412 12/11/20 0312 12/12/20 0315 12/13/20 1007  NA 142 144 148* 149* 148*  K 4.6 4.5 4.9 4.4 4.0  CL 107 108 112* 114* 110  CO2 23 23 27 26 28   GLUCOSE 204* 221* 234* 206* 133*  BUN 57* 71* 71* 57* 41*  CREATININE 1.37* 1.28* 1.14 0.97 0.95  CALCIUM 8.3* 8.4* 8.5* 8.6* 8.8*  MG  --  2.6*  --   --   --    GFR Estimated Creatinine Clearance: 66.8 mL/min (by C-G formula based on SCr of 0.95 mg/dL). Liver Function Tests: Recent Labs  Lab 12/08/20 0353 12/09/20 0541 12/10/20 0412 12/11/20 0312 12/12/20 0315  AST 29 31 27 31 24   ALT 17 19 18 24 23   ALKPHOS 33* 33* 34* 43 47  BILITOT 0.8 0.7 0.8 0.8 0.8  PROT 6.4* 6.0* 6.2* 6.6 6.3*  ALBUMIN 3.0* 2.7* 2.7* 2.9* 2.7*   No results for input(s): LIPASE, AMYLASE in the last 168 hours. No results for input(s): AMMONIA in the last 168 hours. Coagulation profile Recent Labs  Lab 12/01/2020 1221  INR 1.0    CBC: Recent Labs  Lab 12/08/20 0353 12/09/20 0541 12/10/20 0412 12/11/20 0312 12/12/20 0315  WBC 14.9* 16.7* 17.5* 10.6* 10.2  NEUTROABS 13.9* 15.5* 16.4* 9.6* 9.2*  HGB 10.7* 10.0* 10.3* 10.1* 10.2*  HCT 32.3* 31.3*  30.8* 31.2* 30.8*  MCV 94.2 96.0 94.5 95.7 94.8  PLT 113* 120* 135* 162 169   Cardiac Enzymes: No results for input(s): CKTOTAL, CKMB, CKMBINDEX, TROPONINI in the last 168 hours. BNP (last 3 results) No results for input(s): PROBNP in the last 8760 hours. CBG: Recent Labs  Lab 12/12/20 1201 12/12/20 1516 12/12/20 2105 12/13/20 0817 12/13/20 1156  GLUCAP 193* 286* 176* 84 165*   D-Dimer: No results for input(s): DDIMER in the last 72 hours. Hgb A1c: No results for input(s): HGBA1C in the last 72 hours. Lipid Profile: No results for input(s): CHOL, HDL, LDLCALC, TRIG, CHOLHDL, LDLDIRECT in the last 72 hours. Thyroid function studies: No results for input(s): TSH, T4TOTAL, T3FREE, THYROIDAB in the last 72 hours.  Invalid input(s): FREET3 Anemia work up: No results for input(s): VITAMINB12, FOLATE, FERRITIN, TIBC, IRON, RETICCTPCT in the last 72 hours. Sepsis Labs: Recent Labs  Lab 12/18/2020 1221 11/26/2020 1450 12/08/20 0353 12/09/20 0541 12/10/20 0412 12/11/20 0312 12/12/20 0315  PROCALCITON 0.11  --   --   --   --   --   --   WBC 9.6  --    < > 16.7* 17.5* 10.6* 10.2  LATICACIDVEN 1.6 1.6  --   --   --   --   --    < > = values in this interval not displayed.    Microbiology Recent Results (from the past 240 hour(s))  Culture, blood (Routine x 2)     Status: None   Collection Time: 12/21/2020 12:21 PM   Specimen: BLOOD  Result Value Ref Range Status   Specimen Description BLOOD RIGHT ANTECUBITAL  Final   Special Requests   Final    BOTTLES DRAWN AEROBIC AND ANAEROBIC Blood Culture adequate volume   Culture   Final    NO GROWTH 5 DAYS Performed at [facility], [address], [place], [place] [address]    Report Status 12/12/2020 FINAL  Final  Culture, blood (Routine x 2)     Status: None   Collection Time: 2020/12/15 12:21 PM   Specimen: BLOOD  Result Value Ref Range Status   Specimen Description BLOOD LEFT ANTECUBITAL  Final   Special Requests    Final    BOTTLES DRAWN AEROBIC AND ANAEROBIC Blood Culture adequate volume   Culture   Final    NO GROWTH 5 DAYS Performed at [facility], [address] [place], [place] [address]    Report Status 12/12/2020 FINAL  Final  MRSA PCR Screening     Status: None   Collection Time: 12/10/20  9:05 AM   Specimen: Nasopharyngeal  Result Value Ref Range Status   MRSA by PCR NEGATIVE NEGATIVE Final    Comment:        The GeneXpert MRSA Assay (FDA approved for NASAL specimens only), is one component of a comprehensive MRSA colonization surveillance program. It is not intended to diagnose MRSA infection nor to guide or monitor treatment for MRSA infections. Performed at [facility], [address] [place], [place] [address]     Procedures and diagnostic studies:  No results found.             LOS: 6 days   Kashia Brossard  [facility]   Pager on [email or web address]. If 7PM-7AM, please contact night-coverage at [email or web address]     12/13/2020, 4:00 PM

## 2020-12-13 NOTE — Progress Notes (Addendum)
Near 0330, pt desat'ing mid 49s. [place] increased 55L/70%. Continued o2 sat in mid to upper 80s, HHFNC increased to 60L/85%. RT @ bedside, minimal sx's + incentive spirometer used. Pt now Sat'ing 90s. Intermittent proning completed as tolerated by pt. Pt does not appear to be in acute distress. Will continue to monitor.

## 2020-12-14 DIAGNOSIS — E870 Hyperosmolality and hypernatremia: Secondary | ICD-10-CM | POA: Diagnosis not present

## 2020-12-14 DIAGNOSIS — E43 Unspecified severe protein-calorie malnutrition: Secondary | ICD-10-CM | POA: Diagnosis not present

## 2020-12-14 DIAGNOSIS — U071 COVID-19: Secondary | ICD-10-CM | POA: Diagnosis not present

## 2020-12-14 DIAGNOSIS — R9431 Abnormal electrocardiogram [ECG] [EKG]: Secondary | ICD-10-CM | POA: Diagnosis not present

## 2020-12-14 LAB — GLUCOSE, CAPILLARY
Glucose-Capillary: 120 mg/dL — ABNORMAL HIGH (ref 70–99)
Glucose-Capillary: 186 mg/dL — ABNORMAL HIGH (ref 70–99)
Glucose-Capillary: 380 mg/dL — ABNORMAL HIGH (ref 70–99)
Glucose-Capillary: 393 mg/dL — ABNORMAL HIGH (ref 70–99)
Glucose-Capillary: 276 mg/dL — ABNORMAL HIGH (ref 70–99)

## 2020-12-14 LAB — BASIC METABOLIC PANEL
Anion gap: 11 (ref 5–15)
BUN: 43 mg/dL — ABNORMAL HIGH (ref 8–23)
CO2: 26 mmol/L (ref 22–32)
Calcium: 8.6 mg/dL — ABNORMAL LOW (ref 8.9–10.3)
Chloride: 110 mmol/L (ref 98–111)
Creatinine, Ser: 0.92 mg/dL (ref 0.61–1.24)
GFR, Estimated: >60 mL/min (ref >60)
Glucose, Bld: 157 mg/dL — ABNORMAL HIGH (ref 70–99)
Potassium: 4.3 mmol/L (ref 3.5–5.1)
Sodium: 147 mmol/L — ABNORMAL HIGH (ref 135–145)

## 2020-12-14 LAB — PHOSPHORUS: Phosphorus: 3.6 mg/dL (ref 2.5–4.6)

## 2020-12-14 LAB — MAGNESIUM: Magnesium: 2.2 mg/dL (ref 1.7–2.4)

## 2020-12-14 MED ORDER — SENNOSIDES-DOCUSATE SODIUM 8.6-50 MG PO TABS
1.0000 | ORAL_TABLET | Freq: Every day | ORAL | Status: DC
  Administered 2020-12-14 – 2020-12-27 (×14): 1 via ORAL
  Filled 2020-12-14 (×14): qty 1

## 2020-12-14 MED ORDER — POLYETHYLENE GLYCOL 3350 17 G PO PACK
17.0000 g | PACK | Freq: Every day | ORAL | Status: DC | PRN
  Administered 2020-12-14 – 2020-12-27 (×2): 17 g via ORAL
  Filled 2020-12-14 (×2): qty 1

## 2020-12-14 NOTE — Progress Notes (Signed)
Patient transferred from ICU. Patient is resting in bed. Vitals:   12/14/20 1700 12/14/20 1800  BP: 122/67 130/62  Pulse: 60 63  Resp: (!) 23 18  Temp:  98.2 F (36.8 C)  SpO2: 93% 94%  Patient is on [place] oxygen. Patient is comfortable and has no concerns at this time.

## 2020-12-14 NOTE — Plan of Care (Signed)
Pt more alert today with greater activity tolerance and reduced oxygen needs. HFNC now at 45L and 60%. Sat on edge of bed with PT, stood at bedside briefly. Eating approx 50% of meals. Transferred to 251, report to [facility] prior to transfer. Wife notified of room change, plans to come visit tomorrow.   Problem: Education: Goal: Knowledge of General Education information will improve Description: Including pain rating scale, medication(s)/side effects and non-pharmacologic comfort measures Outcome: Progressing   Problem: Health Behavior/Discharge Planning: Goal: Ability to manage health-related needs will improve Outcome: Progressing   Problem: Clinical Measurements: Goal: Ability to maintain clinical measurements within normal limits will improve Outcome: Progressing Goal: Will remain free from infection Outcome: Progressing Goal: Diagnostic test results will improve Outcome: Progressing Goal: Respiratory complications will improve Outcome: Progressing Goal: Cardiovascular complication will be avoided Outcome: Progressing   Problem: Activity: Goal: Risk for activity intolerance will decrease Outcome: Progressing   Problem: Nutrition: Goal: Adequate nutrition will be maintained Outcome: Progressing   Problem: Coping: Goal: Level of anxiety will decrease Outcome: Progressing   Problem: Elimination: Goal: Will not experience complications related to bowel motility Outcome: Progressing Goal: Will not experience complications related to urinary retention Outcome: Progressing   Problem: Pain Managment: Goal: General experience of comfort will improve Outcome: Progressing   Problem: Safety: Goal: Ability to remain free from injury will improve Outcome: Progressing   Problem: Skin Integrity: Goal: Risk for impaired skin integrity will decrease Outcome: Progressing   Problem: Education: Goal: Knowledge of risk factors and measures for prevention of condition will  improve Outcome: Progressing   Problem: Coping: Goal: Psychosocial and spiritual needs will be supported Outcome: Progressing   Problem: Respiratory: Goal: Will maintain a patent airway Outcome: Progressing Goal: Complications related to the disease process, condition or treatment will be avoided or minimized Outcome: Progressing

## 2020-12-14 NOTE — Progress Notes (Addendum)
Progress Note    Paul Parker  [id number] DOB: 01-10-1941  DOA: 12/12/2020 PCP: Volney American, PA-C      Brief Narrative:    Medical records reviewed and are as summarized below:  Paul Parker is a 80 y.o. male medical history significant forhypertension, hyperlipidemia, coronary artery disease, history of prostate cancer, hypothyroid, depression/anxiety, history of atrial fibrillation converted to sinus rhythm on amiodarone in2013/2014, presented to the emergency department for chief concerns of worsening shortness of breath. He was diagnosed with COVID-19 on 11/27/2020.  He has received first and second dose of COVID-vaccine but has not received a booster yet.   He was admitted to the hospital for [id number] pneumonia complicated by acute hypoxic respiratory failure.   Assessment/Plan:   Active Problems:   Primary hypertension   Acute hypoxemic respiratory failure due to COVID-19 (HCC)   Prolonged QT interval   Hypernatremia   Atrial fibrillation (HCC)   Severe protein-calorie malnutrition (HCC)   Malnutrition of moderate degree   Nutrition Problem: Moderate Malnutrition Etiology: social / environmental circumstances (suspected inadequate oral intake now complicated by acute [id number] infection)  Signs/Symptoms: moderate fat depletion,moderate muscle depletion,severe muscle depletion   Body mass index is 23.03 kg/m.    PLAN  COVID-19 pneumonia: Completed IV remdesivir on 12/11/2020. Continue baricitinib, prednisone and Omnicef. No evidence of pulmonary embolism on CTA chest.  Severe acute hypoxic respiratory failure: He is still requiring a lot of oxygen via heated humidified high flow nasal cannula (FiO2 65% and 45 L/min).  CAD: Continue aspirin, Plavix and Lipitor  Paroxysmal atrial fibrillation: Continue amiodarone  Hypotension: Discontinue isosorbide mononitrate, amlodipine and enalapril. Monitor BP closely. Avoiding IV fluids for now in order to keep  his lungs dry.  Hypernatremia: Sodium level is slowly trending down. Monitor sodium off of IV fluids. Encouraged oral hydration.  Right lower extremity swelling: No evidence of DVT on venous duplex.  Elevated troponin: This likely due to demand ischemia.  Comorbidities include anxiety, depression, hypothyroidism        Diet Order            DIET DYS 3 Room service appropriate? Yes with Assist; Fluid consistency: Thin  Diet effective now                    Consultants:  Palliative care team  Procedures:  None    Medications:   . amiodarone  100 mg Oral Daily  . aspirin EC  81 mg Oral Daily  . atorvastatin  40 mg Oral Daily  . baricitinib  4 mg Oral Daily  . cefdinir  300 mg Oral Q12H  . Chlorhexidine Gluconate Cloth  6 each Topical Q0600  . chlorpheniramine-HYDROcodone  5 mL Oral Q12H  . clopidogrel  75 mg Oral Daily  . DULoxetine  30 mg Oral Daily  . enoxaparin (LOVENOX) injection  40 mg Subcutaneous Q24H  . feeding supplement  237 mL Oral BID BM  . ferrous sulfate  325 mg Oral Q breakfast  . insulin aspart  0-15 Units Subcutaneous TID WC  . insulin aspart  0-5 Units Subcutaneous QHS  . insulin glargine  10 Units Subcutaneous Daily  . levothyroxine  75 mcg Oral Q0600  . multivitamin with minerals  1 tablet Oral Daily  . predniSONE  50 mg Oral Daily  . senna-docusate  1 tablet Oral QHS   Continuous Infusions:   Anti-infectives (From admission, onward)   Start     Dose/Rate Route Frequency Ordered  Stop   12/13/20 0800  cefdinir (OMNICEF) capsule 300 mg        300 mg Oral Every 12 hours 12/12/20 1146 12/17/20 0959   12/12/20 1800  cefdinir (OMNICEF) capsule 300 mg  Status:  Discontinued        300 mg Oral Every 12 hours 12/12/20 1135 12/12/20 1146   12/10/20 1000  cefTRIAXone (ROCEPHIN) 1 g in sodium chloride 0.9 % 100 mL IVPB  Status:  Discontinued        1 g 200 mL/hr over 30 Minutes Intravenous Every 24 hours 12/10/20 0911 12/12/20 1135    12/08/20 1000  remdesivir 100 mg in sodium chloride 0.9 % 100 mL IVPB       "Followed by" Linked Group Details   100 mg 200 mL/hr over 30 Minutes Intravenous Daily 12/14/2020 1338 12/11/20 1020   12/08/2020 2345  doxycycline (VIBRAMYCIN) 100 mg in sodium chloride 0.9 % 250 mL IVPB  Status:  Discontinued        100 mg 125 mL/hr over 120 Minutes Intravenous Every 12 hours 12/08/2020 2311 12/08/20 1053   12/01/2020 2315  azithromycin (ZITHROMAX) 500 mg in sodium chloride 0.9 % 250 mL IVPB  Status:  Discontinued        500 mg 250 mL/hr over 60 Minutes Intravenous Every 24 hours 12/12/2020 2307 12/09/2020 2311   12/08/2020 2315  cefTRIAXone (ROCEPHIN) 1 g in sodium chloride 0.9 % 100 mL IVPB  Status:  Discontinued        1 g 200 mL/hr over 30 Minutes Intravenous Every 24 hours 12/02/2020 2307 12/08/20 1053   12/03/2020 1500  remdesivir 200 mg in sodium chloride 0.9% 250 mL IVPB       "Followed by" Linked Group Details   200 mg 580 mL/hr over 30 Minutes Intravenous Once 12/19/2020 1338 12/01/2020 1455             Family Communication/Anticipated D/C date and plan/Code Status   DVT prophylaxis: enoxaparin (LOVENOX) injection 40 mg Start: 11/29/2020 2200 Place TED hose Start: 12/18/2020 1632     Code Status: Partial Code: Okay with intubation and mechanical ventilation but no chest compressions  Family Communication: None Disposition Plan:    Status is: Inpatient  Remains inpatient appropriate because:Unsafe d/c plan and Inpatient level of care appropriate due to severity of illness   Dispo: The patient is from: Home              Anticipated d/c is to: SNF              Anticipated d/c date is: > 3 days              Patient currently is not medically stable to d/c.           Subjective:   Interval events noted. He still feels weak. He is short of breath with minimal exertion.  Objective:    Vitals:   12/14/20 1200 12/14/20 1300 12/14/20 1400 12/14/20 1500  BP: (!) 110/57 (!) 85/45 (!)  106/57 (!) 92/59  Pulse: 62 93 69 74  Resp: (!) 29 (!) 25 (!) 31 (!) 33  Temp: 98 F (36.7 C)     TempSrc: Oral     SpO2: (!) 83% 93% (!) 89% 95%  Weight:      Height:       No data found.   Intake/Output Summary (Last 24 hours) at 12/14/2020 1604 Last data filed at 12/14/2020 1300 Gross per 24 hour  Intake  950 ml  Output 800 ml  Net 150 ml   Filed Weights   12/14/2020 1225 12/10/20 0902  Weight: 79.4 kg 74.9 kg    Exam:  GEN: NAD SKIN: No rash EYES: EOMI ENT: MMM CV: RRR PULM: Bibasilar rales. No wheezing ABD: soft, ND, NT, +BS CNS: AAO x 3, non focal EXT: No edema or tenderness     Data Reviewed:   I have personally reviewed following labs and imaging studies:  Labs: Labs show the following:   Basic Metabolic Panel: Recent Labs  Lab 12/10/20 0412 12/11/20 0312 12/12/20 0315 12/13/20 1007 12/14/20 0649  NA 144 148* 149* 148* 147*  K 4.5 4.9 4.4 4.0 4.3  CL 108 112* 114* 110 110  CO2 23 27 26 28 26   GLUCOSE 221* 234* 206* 133* 157*  BUN 71* 71* 57* 41* 43*  CREATININE 1.28* 1.14 0.97 0.95 0.92  CALCIUM 8.4* 8.5* 8.6* 8.8* 8.6*  MG 2.6*  --   --   --  2.2  PHOS  --   --   --   --  3.6   GFR Estimated Creatinine Clearance: 69 mL/min (by C-G formula based on SCr of 0.92 mg/dL). Liver Function Tests: Recent Labs  Lab 12/08/20 0353 12/09/20 0541 12/10/20 0412 12/11/20 0312 12/12/20 0315  AST 29 31 27 31 24   ALT 17 19 18 24 23   ALKPHOS 33* 33* 34* 43 47  BILITOT 0.8 0.7 0.8 0.8 0.8  PROT 6.4* 6.0* 6.2* 6.6 6.3*  ALBUMIN 3.0* 2.7* 2.7* 2.9* 2.7*   No results for input(s): LIPASE, AMYLASE in the last 168 hours. No results for input(s): AMMONIA in the last 168 hours. Coagulation profile No results for input(s): INR, PROTIME in the last 168 hours.  CBC: Recent Labs  Lab 12/08/20 0353 12/09/20 0541 12/10/20 0412 12/11/20 0312 12/12/20 0315  WBC 14.9* 16.7* 17.5* 10.6* 10.2  NEUTROABS 13.9* 15.5* 16.4* 9.6* 9.2*  HGB 10.7* 10.0* 10.3*  10.1* 10.2*  HCT 32.3* 31.3* 30.8* 31.2* 30.8*  MCV 94.2 96.0 94.5 95.7 94.8  PLT 113* 120* 135* 162 169   Cardiac Enzymes: No results for input(s): CKTOTAL, CKMB, CKMBINDEX, TROPONINI in the last 168 hours. BNP (last 3 results) No results for input(s): PROBNP in the last 8760 hours. CBG: Recent Labs  Lab 12/13/20 1615 12/13/20 2114 12/14/20 0809 12/14/20 1145 12/14/20 1510  GLUCAP 177* 201* 120* 186* 380*   D-Dimer: No results for input(s): DDIMER in the last 72 hours. Hgb A1c: No results for input(s): HGBA1C in the last 72 hours. Lipid Profile: No results for input(s): CHOL, HDL, LDLCALC, TRIG, CHOLHDL, LDLDIRECT in the last 72 hours. Thyroid function studies: No results for input(s): TSH, T4TOTAL, T3FREE, THYROIDAB in the last 72 hours.  Invalid input(s): FREET3 Anemia work up: No results for input(s): VITAMINB12, FOLATE, FERRITIN, TIBC, IRON, RETICCTPCT in the last 72 hours. Sepsis Labs: Recent Labs  Lab 12/09/20 0541 12/10/20 0412 12/11/20 0312 12/12/20 0315  WBC 16.7* 17.5* 10.6* 10.2    Microbiology Recent Results (from the past 240 hour(s))  Culture, blood (Routine x 2)     Status: None   Collection Time: 12/23/2020 12:21 PM   Specimen: BLOOD  Result Value Ref Range Status   Specimen Description BLOOD RIGHT ANTECUBITAL  Final   Special Requests   Final    BOTTLES DRAWN AEROBIC AND ANAEROBIC Blood Culture adequate volume   Culture   Final    NO GROWTH 5 DAYS Performed at [facility], [address] [place], [place] [address]    Report Status 12/12/2020 FINAL  Final  Culture, blood (Routine x 2)     Status: None   Collection Time: 12/01/2020 12:21 PM   Specimen: BLOOD  Result Value Ref Range Status   Specimen Description BLOOD LEFT ANTECUBITAL  Final   Special Requests   Final    BOTTLES DRAWN AEROBIC AND ANAEROBIC Blood Culture adequate volume   Culture   Final    NO GROWTH 5 DAYS Performed at [facility], [address]  [place], [place] [address]    Report Status 12/12/2020 FINAL  Final  MRSA PCR Screening     Status: None   Collection Time: 12/10/20  9:05 AM   Specimen: Nasopharyngeal  Result Value Ref Range Status   MRSA by PCR NEGATIVE NEGATIVE Final    Comment:        The GeneXpert MRSA Assay (FDA approved for NASAL specimens only), is one component of a comprehensive MRSA colonization surveillance program. It is not intended to diagnose MRSA infection nor to guide or monitor treatment for MRSA infections. Performed at [facility], [address] [place], [place] [address]     Procedures and diagnostic studies:  No results found.             LOS: 7 days   Chelisa Hennen  [facility]   Pager on [email or web address]. If 7PM-7AM, please contact night-coverage at [email or web address]     12/14/2020, 4:04 PM

## 2020-12-14 NOTE — Evaluation (Signed)
Physical Therapy Evaluation Patient Details Name: Paul Parker MRN: [id number] DOB: Mar 16, 1941 Today's Date: 12/14/2020   History of Present Illness  presented to ER secondary to progressive SOB; admitted for management of acute hypoxic/hypoxemic respiratory failure due to COVID-19  Clinical Impression  Upon evaluation, patient alert and oriented; follows commands and participates well with session.  Eager for mobility attempts as tolerated.  Generally weak and deconditioned throughout all extremities, but no focal weakness or pain reported.  Able to complete bed mobility with min assist; sit/stand, standing balance and lateral stepping edge of bed (3 steps), mod assist with L HHA. Demonstrates lateral stepping edge of bed, min/mod assist for balance in A/P plane; generally tremulous and unsteady; fatigues quickly, but pleased with performance Moderately SOB with exertional activities; however, sats >90% on HHFNC, HR 80-90s throughout session.  Provided with frequent, intermittent rest periods and cuing for pursed lip breathing, relaxation for recovery. Would benefit from skilled PT to address above deficits and promote optimal return to PLOF.; recommend transition to STR upon discharge from acute hospitalization.     Follow Up Recommendations SNF    Equipment Recommendations       Recommendations for Other Services       Precautions / Restrictions Precautions Precautions: Fall Restrictions Weight Bearing Restrictions: No      Mobility  Bed Mobility Overal bed mobility: Needs Assistance Bed Mobility: Supine to Sit     Supine to sit: Min assist          Transfers Overall transfer level: Needs assistance Equipment used: 1 person hand held assist Transfers: Sit to/from Stand Sit to Stand: Min assist;Mod assist         General transfer comment: assist for lift off and stabilization; increased sway in A/P plane  Ambulation/Gait Ambulation/Gait assistance: Min assist;Mod  assist Gait Distance (Feet): 3 Feet Assistive device: 1 person hand held assist       General Gait Details: lateral stepping edge of bed, min/mod assist for balance in A/P plane; generally tremulous and unsteady; fatigues quickly, but pleased with performance  Stairs            Wheelchair Mobility    Modified Rankin (Stroke Patients Only)       Balance Overall balance assessment: Needs assistance Sitting-balance support: No upper extremity supported;Feet supported Sitting balance-Leahy Scale: Good     Standing balance support: Single extremity supported Standing balance-Leahy Scale: Poor                               Pertinent Vitals/Pain Pain Assessment: No/denies pain    Home Living Family/patient expects to be discharged to:: Private residence Living Arrangements: Spouse/significant other Available Help at Discharge: Family;Available PRN/intermittently Type of Home: House       Home Layout: One level Home Equipment: None      Prior Function Level of Independence: Independent         Comments: Indep with ADLs, household and community mobilization without assist device; denies fall history; intermittent home O2 (?)     Hand Dominance   Dominant Hand: Right    Extremity/Trunk Assessment   Upper Extremity Assessment Upper Extremity Assessment: Generalized weakness    Lower Extremity Assessment Lower Extremity Assessment: Generalized weakness (grossly 4-/5 throughout; no focal weakness, asymmetry)       Communication   Communication: No difficulties  Cognition Arousal/Alertness: Awake/alert Behavior During Therapy: WFL for tasks assessed/performed Overall Cognitive Status: Within Functional Limits  for tasks assessed                                        General Comments      Exercises Other Exercises Other Exercises: Unsupported sitting edge of bed, participated with upper body therex to promote postural  extension, chest expansion and deep breathing; LE therex (ankle pumps, LAQs) for strength and flexibility.   Assessment/Plan    PT Assessment Patient needs continued PT services  PT Problem List Decreased strength;Decreased activity tolerance;Decreased balance;Decreased mobility;Decreased cognition;Decreased knowledge of use of DME;Decreased safety awareness;Decreased knowledge of precautions;Cardiopulmonary status limiting activity       PT Treatment Interventions DME instruction;Gait training;Stair training;Functional mobility training;Therapeutic activities;Therapeutic exercise;Balance training;Patient/family education    PT Goals (Current goals can be found in the Care Plan section)  Acute Rehab PT Goals Patient Stated Goal: to get stronger PT Goal Formulation: With patient Time For Goal Achievement: 01/07/2021 Potential to Achieve Goals: Good    Frequency Min 2X/week   Barriers to discharge        Co-evaluation               AM-PAC PT "6 Clicks" Mobility  Outcome Measure Help needed turning from your back to your side while in a flat bed without using bedrails?: A Little Help needed moving from lying on your back to sitting on the side of a flat bed without using bedrails?: A Little Help needed moving to and from a bed to a chair (including a wheelchair)?: A Lot Help needed standing up from a chair using your arms (e.g., wheelchair or bedside chair)?: A Lot Help needed to walk in hospital room?: A Lot Help needed climbing 3-5 steps with a railing? : A Lot 6 Click Score: 14    End of Session Equipment Utilized During Treatment: Gait belt Activity Tolerance: Patient tolerated treatment well Patient left: in bed;with call bell/phone within reach;with chair alarm set Nurse Communication: Mobility status PT Visit Diagnosis: Muscle weakness (generalized) (M62.81);Difficulty in walking, not elsewhere classified (R26.2)    Time: 8250-5397 PT Time Calculation (min) (ACUTE  ONLY): 24 min   Charges:   PT Evaluation $PT Eval High Complexity: 1 High PT Treatments $Therapeutic Exercise: 8-22 mins        Tiwana Chavis H. Owens Shark, PT, DPT, NCS 12/14/20, 6:12 PM [phone]

## 2020-12-15 ENCOUNTER — Encounter: Payer: Self-pay | Admitting: Nurse Practitioner

## 2020-12-15 DIAGNOSIS — E1169 Type 2 diabetes mellitus with other specified complication: Secondary | ICD-10-CM | POA: Insufficient documentation

## 2020-12-15 DIAGNOSIS — Z87820 Personal history of traumatic brain injury: Secondary | ICD-10-CM | POA: Insufficient documentation

## 2020-12-15 DIAGNOSIS — U071 COVID-19: Secondary | ICD-10-CM | POA: Diagnosis not present

## 2020-12-15 DIAGNOSIS — I700 Atherosclerosis of aorta: Secondary | ICD-10-CM | POA: Insufficient documentation

## 2020-12-15 DIAGNOSIS — J9601 Acute respiratory failure with hypoxia: Secondary | ICD-10-CM | POA: Diagnosis not present

## 2020-12-15 DIAGNOSIS — Z7189 Other specified counseling: Secondary | ICD-10-CM | POA: Diagnosis not present

## 2020-12-15 DIAGNOSIS — E870 Hyperosmolality and hypernatremia: Secondary | ICD-10-CM | POA: Diagnosis not present

## 2020-12-15 LAB — GLUCOSE, CAPILLARY
Glucose-Capillary: 112 mg/dL — ABNORMAL HIGH (ref 70–99)
Glucose-Capillary: 177 mg/dL — ABNORMAL HIGH (ref 70–99)
Glucose-Capillary: 373 mg/dL — ABNORMAL HIGH (ref 70–99)
Glucose-Capillary: 320 mg/dL — ABNORMAL HIGH (ref 70–99)

## 2020-12-15 NOTE — Progress Notes (Signed)
Daily Progress Note   Patient Name: Paul Parker       Date: 12/15/2020 DOB: 17-Mar-1941  Age: 80 y.o. MRN#: [id number] Attending Physician: Jennye Boroughs, MD Primary Care Physician: Volney American, PA-C Admit Date: 12-08-2020  Reason for Consultation/Follow-up: Establishing goals of care  Subjective: Patient states he is feeling better today than he has been. He states he would like to continue his current care to try to improve his health. Spoke with Dawn who states the family has been communicating and would like to continue current care. Unable to reach patient's wife.    Length of Stay: 8  Current Medications: Scheduled Meds:  . amiodarone  100 mg Oral Daily  . aspirin EC  81 mg Oral Daily  . atorvastatin  40 mg Oral Daily  . baricitinib  4 mg Oral Daily  . cefdinir  300 mg Oral Q12H  . chlorpheniramine-HYDROcodone  5 mL Oral Q12H  . clopidogrel  75 mg Oral Daily  . DULoxetine  30 mg Oral Daily  . enoxaparin (LOVENOX) injection  40 mg Subcutaneous Q24H  . feeding supplement  237 mL Oral BID BM  . ferrous sulfate  325 mg Oral Q breakfast  . insulin aspart  0-15 Units Subcutaneous TID WC  . insulin aspart  0-5 Units Subcutaneous QHS  . insulin glargine  10 Units Subcutaneous Daily  . levothyroxine  75 mcg Oral Q0600  . multivitamin with minerals  1 tablet Oral Daily  . predniSONE  50 mg Oral Daily  . senna-docusate  1 tablet Oral QHS    Continuous Infusions:   PRN Meds: acetaminophen, guaiFENesin-dextromethorphan, ondansetron **OR** ondansetron (ZOFRAN) IV, polyethylene glycol  Physical Exam          Vital Signs: BP (!) 98/45 (BP Location: Right Arm)   Pulse 74   Temp 98 F (36.7 C) (Oral)   Resp 18   Ht 5\' 11"  (1.803 m)   Wt 72.5 kg   SpO2 (!) 88%   BMI  22.29 kg/m  SpO2: SpO2: (!) 88 % O2 Device: O2 Device: High Flow Nasal Cannula O2 Flow Rate: O2 Flow Rate (L/min): (S) 50 L/min  Intake/output summary:   Intake/Output Summary (Last 24 hours) at 12/15/2020 1435 Last data filed at 12/15/2020 0619 Gross per 24 hour  Intake 275 ml  Output  200 ml  Net 75 ml   LBM: Last BM Date: 12/10/20 Baseline Weight: Weight: 79.4 kg Most recent weight: Weight: 72.5 kg       Palliative Assessment/Data:    Flowsheet Rows   Flowsheet Row Most Recent Value  Intake Tab   Referral Department Hospitalist  Unit at Time of Referral ER  Palliative Care Primary Diagnosis Sepsis/Infectious Disease  Date Notified 12/11/20  Palliative Care Type New Palliative care  Reason for referral Clarify Goals of Care  Date of Admission 12/04/2020  Date first seen by Palliative Care 12/11/20  # of days Palliative referral response time 0 Day(s)  # of days IP prior to Palliative referral 4  Clinical Assessment   Psychosocial & Spiritual Assessment   Palliative Care Outcomes       Patient Active Problem List   Diagnosis Date Noted  . Malnutrition of moderate degree 12/13/2020  . Hypernatremia   . Atrial fibrillation (HCC)   . Severe protein-calorie malnutrition (HCC)   . Swelling of lower leg   . Acute hypoxemic respiratory failure due to COVID-19 (HCC) 12/01/2020  . Prolonged QT interval 12/12/2020  . SSS (sick sinus syndrome) (HCC) 04/05/2020  . 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 Memory changes 12/02/2019  . Recurrent major depressive disorder, in full remission (HCC) 09/30/2018  . Anemia 09/30/2018  . Seborrheic keratosis 04/27/2018  . Advanced care planning/counseling discussion 10/28/2017  . Senile purpura (HCC) 08/30/2015  . CAD (coronary artery disease) 08/30/2015  . Concussion with coma 08/30/2015  . BPH (benign prostatic hyperplasia) 08/30/2015  . Hypothyroidism 08/30/2015  . Depression, recurrent (HCC) 08/30/2015  . 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 Hyperlipidemia   . Primary hypertension     Palliative Care Assessment & Plan    Recommendations/Plan:  Continue current care.     Code Status:    Code Status Orders  (From admission, onward)         Start     Ordered   12/15/20 1428  Limited resuscitation (code)  Continuous       Question Answer Comment  In the event of cardiac or respiratory ARREST: Initiate Code Blue, Call Rapid Response Yes   In the event of cardiac or respiratory ARREST: Perform CPR No   In the event of cardiac or respiratory ARREST: Perform Intubation/Mechanical Ventilation Yes   In the event of cardiac or respiratory ARREST: Use NIPPV/BiPAp only if indicated Yes   In the event of cardiac or respiratory ARREST: Administer ACLS medications if indicated Yes   In the event of cardiac or respiratory ARREST: Perform Defibrillation or Cardioversion if indicated Yes   Comments Intubate for respiratory distress/failure. In [place]      12/15/20 1427        Code Status History    Date Active Date Inactive Code Status Order ID Comments User Context   12/12/2020 1544 12/15/2020 1427 Partial Code [id number]  Morton Stall, NP Inpatient   11/28/2020 1634 12/12/2020 1544 Full Code [id number]  CoxNadyne Coombes, DO ED   Advance Care Planning Activity       Prognosis:   Unable to determine   Thank you for allowing the Palliative Medicine Team to assist in the care of this patient.   Total Time 15 min Prolonged Time Billed  no  COVID-19 DISASTER DECLARATION:    FULL CONTACT PHYSICAL EXAMINATION WAS NOT POSSIBLE DUE TO TREATMENT OF COVID-19  AND CONSERVATION OF PERSONAL PROTECTIVE EQUIPMENT.   Patient  assessed or the symptoms described in the history of present illness.  In the context of the Global COVID-19 pandemic, which necessitated consideration that the patient might be at risk for infection with the SARS-CoV-2 virus that causes COVID-19, Institutional protocols  and algorithms that pertain to the evaluation of patients at risk for COVID-19 are in a state of rapid change based on information released by regulatory bodies including the CDC and federal and state organizations. These policies and algorithms were followed during the patient's care while in hospital.    Greater than 50%  of this time was spent counseling and coordinating care related to the above assessment and plan.  Asencion Gowda, NP  Please contact Palliative Medicine Team phone at [phone] for questions and concerns.

## 2020-12-15 NOTE — Progress Notes (Signed)
Spoke on phone with daughter, Arrie Aran for updates.  Dawn asked if pt could have any visitors  Wife planning to come and visit tomorrow)and I instructed her of the new guidelines and that, no, he could not have visitors being a Covid patient unless they had permission from doctor and manager of unit or unless  he, patient)  was on comfort care.  She verbalized understand and stated that she will call tomorrow to speak with unit manager.
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
## 2020-12-15 NOTE — Progress Notes (Signed)
Progress Note    Paul Parker  GXQ:[id number] DOB: [birth year]  DOA: 12/13/2020 PCP: Volney American, PA-C      Brief Narrative:    Medical records reviewed and are as summarized below:  Paul Parker is a 79 y.o. male medical history significant forhypertension, hyperlipidemia, coronary artery disease, history of prostate cancer, hypothyroid, depression/anxiety, history of atrial fibrillation converted to sinus rhythm on amiodarone in2013/2014, presented to the emergency department for chief concerns of worsening shortness of breath. He was diagnosed with COVID-19 on 11/27/2020.  He has received first and second dose of COVID-vaccine but has not received a booster yet.   He was admitted to the hospital for [id number] pneumonia complicated by acute hypoxic respiratory failure.   Assessment/Plan:   Active Problems:   Primary hypertension   Acute hypoxemic respiratory failure due to COVID-19 (HCC)   Prolonged QT interval   Hypernatremia   Atrial fibrillation (HCC)   Severe protein-calorie malnutrition (HCC)   Malnutrition of moderate degree   Nutrition Problem: Moderate Malnutrition Etiology: social / environmental circumstances (suspected inadequate oral intake now complicated by acute [id number] infection)  Signs/Symptoms: moderate fat depletion,moderate muscle depletion,severe muscle depletion   Body mass index is 22.29 kg/m.    PLAN  COVID-19 pneumonia: Completed IV remdesivir on 12/11/2020.  Continue baricitinib, prednisone and Omnicef. No evidence of pulmonary embolism on CTA chest.  Severe acute hypoxic respiratory failure: Oxygen requirements increased from FiO2 of 50 to 85% and flow rate increased from 45 to 50% on heated humidified high flow nasal cannula.  CAD: Continue aspirin, Plavix and Lipitor  Paroxysmal atrial fibrillation: Continue amiodarone  Hypotension: BP is still on the low side.  Isosorbide mononitrate, amlodipine and enalapril have been  discontinued  Hypernatremia: Repeat sodium level tomorrow.  Monitor sodium off of IV fluids. Encouraged oral hydration.  Right lower extremity swelling: No evidence of DVT on venous duplex.  Elevated troponin: This likely due to demand ischemia.  Comorbidities include anxiety, depression, hypothyroidism        Diet Order            DIET DYS 3 Room service appropriate? Yes with Assist; Fluid consistency: Thin  Diet effective now                    Consultants:  Palliative care team  Procedures:  None    Medications:   . amiodarone  100 mg Oral Daily  . aspirin EC  81 mg Oral Daily  . atorvastatin  40 mg Oral Daily  . baricitinib  4 mg Oral Daily  . cefdinir  300 mg Oral Q12H  . chlorpheniramine-HYDROcodone  5 mL Oral Q12H  . clopidogrel  75 mg Oral Daily  . DULoxetine  30 mg Oral Daily  . enoxaparin (LOVENOX) injection  40 mg Subcutaneous Q24H  . feeding supplement  237 mL Oral BID BM  . ferrous sulfate  325 mg Oral Q breakfast  . insulin aspart  0-15 Units Subcutaneous TID WC  . insulin aspart  0-5 Units Subcutaneous QHS  . insulin glargine  10 Units Subcutaneous Daily  . levothyroxine  75 mcg Oral Q0600  . multivitamin with minerals  1 tablet Oral Daily  . predniSONE  50 mg Oral Daily  . senna-docusate  1 tablet Oral QHS   Continuous Infusions:   Anti-infectives (From admission, onward)   Start     Dose/Rate Route Frequency Ordered Stop   12/13/20 0800  cefdinir (OMNICEF) capsule 300 mg  300 mg Oral Every 12 hours 12/12/20 1146 12/17/20 0959   12/12/20 1800  cefdinir (OMNICEF) capsule 300 mg  Status:  Discontinued        300 mg Oral Every 12 hours 12/12/20 1135 12/12/20 1146   12/10/20 1000  cefTRIAXone (ROCEPHIN) 1 g in sodium chloride 0.9 % 100 mL IVPB  Status:  Discontinued        1 g 200 mL/hr over 30 Minutes Intravenous Every 24 hours 12/10/20 0911 12/12/20 1135   12/08/20 1000  remdesivir 100 mg in sodium chloride 0.9 % 100 mL IVPB        "Followed by" Linked Group Details   100 mg 200 mL/hr over 30 Minutes Intravenous Daily 12/02/2020 1338 12/11/20 1020   12/13/2020 2345  doxycycline (VIBRAMYCIN) 100 mg in sodium chloride 0.9 % 250 mL IVPB  Status:  Discontinued        100 mg 125 mL/hr over 120 Minutes Intravenous Every 12 hours 12/13/2020 2311 12/08/20 1053   12/11/2020 2315  azithromycin (ZITHROMAX) 500 mg in sodium chloride 0.9 % 250 mL IVPB  Status:  Discontinued        500 mg 250 mL/hr over 60 Minutes Intravenous Every 24 hours 12/02/2020 2307 12/16/2020 2311   12/16/2020 2315  cefTRIAXone (ROCEPHIN) 1 g in sodium chloride 0.9 % 100 mL IVPB  Status:  Discontinued        1 g 200 mL/hr over 30 Minutes Intravenous Every 24 hours 12/13/2020 2307 12/08/20 1053   12/10/2020 1500  remdesivir 200 mg in sodium chloride 0.9% 250 mL IVPB       "Followed by" Linked Group Details   200 mg 580 mL/hr over 30 Minutes Intravenous Once 11/27/2020 1338 11/27/2020 1455             Family Communication/Anticipated D/C date and plan/Code Status   DVT prophylaxis: enoxaparin (LOVENOX) injection 40 mg Start: 12/20/2020 2200 Place TED hose Start: 12/17/2020 1632     Code Status: Partial Code: Okay with intubation and mechanical ventilation but no chest compressions  Family Communication: None Disposition Plan:    Status is: Inpatient  Remains inpatient appropriate because:Unsafe d/c plan and Inpatient level of care appropriate due to severity of illness   Dispo: The patient is from: Home              Anticipated d/c is to: SNF              Anticipated d/c date is: > 3 days              Patient currently is not medically stable to d/c.           Subjective:   Interval events noted.  He was asking when he could go home.  He still feels short of breath.  Objective:    Vitals:   12/15/20 0840 12/15/20 0849 12/15/20 1312 12/15/20 1345  BP:  131/69 (!) 98/45   Pulse:  67 74   Resp:  18 18   Temp:  97.7 F (36.5 C) 98 F (36.7  C)   TempSrc:  Oral Oral   SpO2: 96% 91% 90% (!) 88%  Weight:      Height:       No data found.   Intake/Output Summary (Last 24 hours) at 12/15/2020 1534 Last data filed at 12/15/2020 0619 Gross per 24 hour  Intake 275 ml  Output 200 ml  Net 75 ml   Filed Weights   12/10/20 0902 12/14/20  1835 12/15/20 0246  Weight: 74.9 kg 72.5 kg 72.5 kg    Exam:  GEN: NAD SKIN: Warm and dry EYES: No pallor or icterus ENT: MMM CV: RRR PULM: CTA B ABD: soft, ND, NT, +BS CNS: AAO x 3, non focal EXT: No edema or tenderness        Data Reviewed:   I have personally reviewed following labs and imaging studies:  Labs: Labs show the following:   Basic Metabolic Panel: Recent Labs  Lab 12/10/20 0412 12/11/20 0312 12/12/20 0315 12/13/20 1007 12/14/20 0649  NA 144 148* 149* 148* 147*  K 4.5 4.9 4.4 4.0 4.3  CL 108 112* 114* 110 110  CO2 23 27 26 28 26   GLUCOSE 221* 234* 206* 133* 157*  BUN 71* 71* 57* 41* 43*  CREATININE 1.28* 1.14 0.97 0.95 0.92  CALCIUM 8.4* 8.5* 8.6* 8.8* 8.6*  MG 2.6*  --   --   --  2.2  PHOS  --   --   --   --  3.6   GFR Estimated Creatinine Clearance: 66.8 mL/min (by C-G formula based on SCr of 0.92 mg/dL). Liver Function Tests: Recent Labs  Lab 12/09/20 0541 12/10/20 0412 12/11/20 0312 12/12/20 0315  AST 31 27 31 24   ALT 19 18 24 23   ALKPHOS 33* 34* 43 47  BILITOT 0.7 0.8 0.8 0.8  PROT 6.0* 6.2* 6.6 6.3*  ALBUMIN 2.7* 2.7* 2.9* 2.7*   No results for input(s): LIPASE, AMYLASE in the last 168 hours. No results for input(s): AMMONIA in the last 168 hours. Coagulation profile No results for input(s): INR, PROTIME in the last 168 hours.  CBC: Recent Labs  Lab 12/09/20 0541 12/10/20 0412 12/11/20 0312 12/12/20 0315  WBC 16.7* 17.5* 10.6* 10.2  NEUTROABS 15.5* 16.4* 9.6* 9.2*  HGB 10.0* 10.3* 10.1* 10.2*  HCT 31.3* 30.8* 31.2* 30.8*  MCV 96.0 94.5 95.7 94.8  PLT 120* 135* 162 169   Cardiac Enzymes: No results for input(s):  CKTOTAL, CKMB, CKMBINDEX, TROPONINI in the last 168 hours. BNP (last 3 results) No results for input(s): PROBNP in the last 8760 hours. CBG: Recent Labs  Lab 12/14/20 1510 12/14/20 1642 12/14/20 2138 12/15/20 0846 12/15/20 1255  GLUCAP 380* 393* 276* 112* 177*   D-Dimer: No results for input(s): DDIMER in the last 72 hours. Hgb A1c: No results for input(s): HGBA1C in the last 72 hours. Lipid Profile: No results for input(s): CHOL, HDL, LDLCALC, TRIG, CHOLHDL, LDLDIRECT in the last 72 hours. Thyroid function studies: No results for input(s): TSH, T4TOTAL, T3FREE, THYROIDAB in the last 72 hours.  Invalid input(s): FREET3 Anemia work up: No results for input(s): VITAMINB12, FOLATE, FERRITIN, TIBC, IRON, RETICCTPCT in the last 72 hours. Sepsis Labs: Recent Labs  Lab 12/09/20 0541 12/10/20 0412 12/11/20 0312 12/12/20 0315  WBC 16.7* 17.5* 10.6* 10.2    Microbiology Recent Results (from the past 240 hour(s))  Culture, blood (Routine x 2)     Status: None   Collection Time: 12/26/20 12:21 PM   Specimen: BLOOD  Result Value Ref Range Status   Specimen Description BLOOD RIGHT ANTECUBITAL  Final   Special Requests   Final    BOTTLES DRAWN AEROBIC AND ANAEROBIC Blood Culture adequate volume   Culture   Final    NO GROWTH 5 DAYS Performed at [facility], [address] [place], [facility] [address]    Report Status 12/12/2020 FINAL  Final  Culture, blood (Routine x 2)     Status:  None   Collection Time: 12-08-20 12:21 PM   Specimen: BLOOD  Result Value Ref Range Status   Specimen Description BLOOD LEFT ANTECUBITAL  Final   Special Requests   Final    BOTTLES DRAWN AEROBIC AND ANAEROBIC Blood Culture adequate volume   Culture   Final    NO GROWTH 5 DAYS Performed at [facility], [address] [place], [place] [address]    Report Status 12/12/2020 FINAL  Final  MRSA PCR Screening     Status: None   Collection Time: 12/10/20  9:05 AM   Specimen:  Nasopharyngeal  Result Value Ref Range Status   MRSA by PCR NEGATIVE NEGATIVE Final    Comment:        The GeneXpert MRSA Assay (FDA approved for NASAL specimens only), is one component of a comprehensive MRSA colonization surveillance program. It is not intended to diagnose MRSA infection nor to guide or monitor treatment for MRSA infections. Performed at [facility], [address] [place], [place] [address]     Procedures and diagnostic studies:  No results found.             LOS: 8 days   Paul Parker  [facility]   Pager on [email or web address]. If 7PM-7AM, please contact night-coverage at [email or web address]     12/15/2020, 3:34 PM

## 2020-12-15 NOTE — Progress Notes (Signed)
Physical Therapy Treatment Patient Details Name: Paul Parker MRN: [id number] DOB: May 25, 1941 Today's Date: 12/15/2020    History of Present Illness presented to ER secondary to progressive SOB; admitted for management of acute hypoxic/hypoxemic respiratory failure due to COVID-19    PT Comments    Pt seen this pm, spoke with nursing prior to entering room. Pt cleared for PT however nursing notified therapist that pt desated earlier. Pt received in bed, long sitting on HHFNC with resting sats at 91-93%. Pt with c/o fatigue but willing to complete strengthening exercises. B LE AROM in supine consisting of AP x 20, heel slides, hip ABD/ADD, hip ER/IR, SAQ x 10 reps each. Pt required several rest breaks in between exercises due to sats dropping to 80% with increased time to recover. Verbal and visual cues for PLB technique. Pt unable to tolerate sitting EOB and declined offer to assist. Will try and progress to chair next visit if pt able. Continue PT per POC.   Follow Up Recommendations  SNF     Equipment Recommendations       Recommendations for Other Services       Precautions / Restrictions Precautions Precautions: Fall Restrictions Weight Bearing Restrictions: No    Mobility  Bed Mobility                  Transfers                    Ambulation/Gait                 Stairs             Wheelchair Mobility    Modified Rankin (Stroke Patients Only)       Balance                                            Cognition Arousal/Alertness: Awake/alert Behavior During Therapy: WFL for tasks assessed/performed Overall Cognitive Status: Within Functional Limits for tasks assessed                                        Exercises General Exercises - Lower Extremity Ankle Circles/Pumps: AROM;Both;20 reps;Supine Short Arc Quad: AROM;Both;10 reps;Supine Heel Slides: AROM;Both;10 reps;Supine Hip  ABduction/ADduction: AROM;Both;10 reps;Supine    General Comments General comments (skin integrity, edema, etc.): Pt on HHFNC O2 sats 91%, dropping to 80% with minimal activity.      Pertinent Vitals/Pain Pain Assessment: No/denies pain    Home Living                      Prior Function            PT Goals (current goals can now be found in the care plan section)      Frequency    Min 2X/week      PT Plan      Co-evaluation              AM-PAC PT "6 Clicks" Mobility   Outcome Measure  Help needed turning from your back to your side while in a flat bed without using bedrails?: A Little Help needed moving from lying on your back to sitting on the side of a flat bed without using bedrails?: A Little Help needed moving  to and from a bed to a chair (including a wheelchair)?: A Lot Help needed standing up from a chair using your arms (e.g., wheelchair or bedside chair)?: A Lot Help needed to walk in hospital room?: A Lot Help needed climbing 3-5 steps with a railing? : A Lot 6 Click Score: 14    End of Session   Activity Tolerance: Patient limited by fatigue (limited by drop in O2 sats) Patient left: in bed;with call bell/phone within reach;with bed alarm set Nurse Communication: Mobility status (O2 sats) PT Visit Diagnosis: Muscle weakness (generalized) (M62.81);Difficulty in walking, not elsewhere classified (R26.2)     Time: 7616-0737 PT Time Calculation (min) (ACUTE ONLY): 24 min  Charges:  $Therapeutic Exercise: 23-37 mins                     Mikel Cella, PTA   Josie Dixon 12/15/2020, 3:44 PM

## 2020-12-15 NOTE — Progress Notes (Signed)
Patient was eating and HFNC became dislodged from nares, replaced and oxygen titrated to obtain sat greater than 85%

## 2020-12-16 DIAGNOSIS — U071 COVID-19: Secondary | ICD-10-CM | POA: Diagnosis present

## 2020-12-16 DIAGNOSIS — J1282 Pneumonia due to coronavirus disease 2019: Secondary | ICD-10-CM | POA: Diagnosis not present

## 2020-12-16 DIAGNOSIS — J9601 Acute respiratory failure with hypoxia: Secondary | ICD-10-CM | POA: Diagnosis not present

## 2020-12-16 LAB — BASIC METABOLIC PANEL
Anion gap: 10 (ref 5–15)
BUN: 45 mg/dL — ABNORMAL HIGH (ref 8–23)
CO2: 29 mmol/L (ref 22–32)
Calcium: 8.9 mg/dL (ref 8.9–10.3)
Chloride: 108 mmol/L (ref 98–111)
Creatinine, Ser: 0.94 mg/dL (ref 0.61–1.24)
GFR, Estimated: >60 mL/min (ref >60)
Glucose, Bld: 93 mg/dL (ref 70–99)
Potassium: 4.3 mmol/L (ref 3.5–5.1)
Sodium: 147 mmol/L — ABNORMAL HIGH (ref 135–145)

## 2020-12-16 LAB — GLUCOSE, CAPILLARY
Glucose-Capillary: 142 mg/dL — ABNORMAL HIGH (ref 70–99)
Glucose-Capillary: 131 mg/dL — ABNORMAL HIGH (ref 70–99)
Glucose-Capillary: 382 mg/dL — ABNORMAL HIGH (ref 70–99)
Glucose-Capillary: 285 mg/dL — ABNORMAL HIGH (ref 70–99)

## 2020-12-16 NOTE — Progress Notes (Addendum)
Progress Note    Paul Parker  HUT:[id number] DOB: 1941-11-01  DOA: 12/05/2020 PCP: Volney American, PA-C      Brief Narrative:    Medical records reviewed and are as summarized below:  Paul Parker is a 80 y.o. male medical history significant forhypertension, hyperlipidemia, coronary artery disease, history of prostate cancer, hypothyroid, depression/anxiety, history of atrial fibrillation converted to sinus rhythm on amiodarone in2013/2014, presented to the emergency department for chief concerns of worsening shortness of breath. He was diagnosed with COVID-19 on 11/27/2020.  He has received first and second dose of COVID-vaccine but has not received a booster yet.   He was admitted to the hospital for [id number]-[id number] pneumonia complicated by acute hypoxic respiratory failure.   Assessment/Plan:   Active Problems:   Hypertension associated with diabetes ([place])   Acute hypoxemic respiratory failure due to COVID-19 (HCC)   Prolonged QT interval   Hypernatremia   Atrial fibrillation (HCC)   Severe protein-calorie malnutrition (HCC)   Malnutrition of moderate degree   Pneumonia due to COVID-19 virus   Nutrition Problem: Moderate Malnutrition Etiology: social / environmental circumstances (suspected inadequate oral intake now complicated by acute NTZGY-17 infection)  Signs/Symptoms: moderate fat depletion,moderate muscle depletion,severe muscle depletion   Body mass index is 22.29 kg/m.    PLAN  COVID-19 pneumonia: Completed IV remdesivir on 12/11/2020.  Continue baricitinib, prednisone and Omnicef. No evidence of pulmonary embolism on CTA chest.  Severe acute hypoxic respiratory failure: He remains on oxygen via heated humidified high flow nasal cannula (FiO2 100% and flow rate 55 L/min)  CAD: Continue aspirin, Plavix and Lipitor  Paroxysmal atrial fibrillation: Continue amiodarone  Hypotension: BP is better.  Isosorbide mononitrate, amlodipine and enalapril have been  discontinued  Hypernatremia: Sodium level is stable at 147.  Continue to monitor. Encouraged oral hydration.  Right lower extremity swelling: No evidence of DVT on venous duplex.  Elevated troponin: This likely due to demand ischemia.  Comorbidities include anxiety, depression, hypothyroidism        Diet Order            DIET DYS 2 Room service appropriate? Yes with Assist; Fluid consistency: Nectar Thick  Diet effective now                    Consultants:  Palliative care team  Procedures:  None    Medications:   . amiodarone  100 mg Oral Daily  . aspirin EC  81 mg Oral Daily  . atorvastatin  40 mg Oral Daily  . baricitinib  4 mg Oral Daily  . cefdinir  300 mg Oral Q12H  . chlorpheniramine-HYDROcodone  5 mL Oral Q12H  . clopidogrel  75 mg Oral Daily  . DULoxetine  30 mg Oral Daily  . enoxaparin (LOVENOX) injection  40 mg Subcutaneous Q24H  . feeding supplement  237 mL Oral BID BM  . ferrous sulfate  325 mg Oral Q breakfast  . insulin aspart  0-15 Units Subcutaneous TID WC  . insulin aspart  0-5 Units Subcutaneous QHS  . insulin glargine  10 Units Subcutaneous Daily  . levothyroxine  75 mcg Oral Q0600  . multivitamin with minerals  1 tablet Oral Daily  . predniSONE  50 mg Oral Daily  . senna-docusate  1 tablet Oral QHS   Continuous Infusions:   Anti-infectives (From admission, onward)   Start     Dose/Rate Route Frequency Ordered Stop   12/13/20 0800  cefdinir (OMNICEF) capsule 300 mg  300 mg Oral Every 12 hours 12/12/20 1146 12/17/20 0959   12/12/20 1800  cefdinir (OMNICEF) capsule 300 mg  Status:  Discontinued        300 mg Oral Every 12 hours 12/12/20 1135 12/12/20 1146   12/10/20 1000  cefTRIAXone (ROCEPHIN) 1 g in sodium chloride 0.9 % 100 mL IVPB  Status:  Discontinued        1 g 200 mL/hr over 30 Minutes Intravenous Every 24 hours 12/10/20 0911 12/12/20 1135   12/08/20 1000  remdesivir 100 mg in sodium chloride 0.9 % 100 mL IVPB        "Followed by" Linked Group Details   100 mg 200 mL/hr over 30 Minutes Intravenous Daily 12/19/2020 1338 12/11/20 1020   12/06/2020 2345  doxycycline (VIBRAMYCIN) 100 mg in sodium chloride 0.9 % 250 mL IVPB  Status:  Discontinued        100 mg 125 mL/hr over 120 Minutes Intravenous Every 12 hours 11/29/2020 2311 12/08/20 1053   12/13/2020 2315  azithromycin (ZITHROMAX) 500 mg in sodium chloride 0.9 % 250 mL IVPB  Status:  Discontinued        500 mg 250 mL/hr over 60 Minutes Intravenous Every 24 hours 12/05/2020 2307 12/23/2020 2311   12/05/2020 2315  cefTRIAXone (ROCEPHIN) 1 g in sodium chloride 0.9 % 100 mL IVPB  Status:  Discontinued        1 g 200 mL/hr over 30 Minutes Intravenous Every 24 hours 12/19/2020 2307 12/08/20 1053   12/03/2020 1500  remdesivir 200 mg in sodium chloride 0.9% 250 mL IVPB       "Followed by" Linked Group Details   200 mg 580 mL/hr over 30 Minutes Intravenous Once 11/25/2020 1338 12/13/2020 1455             Family Communication/Anticipated D/C date and plan/Code Status   DVT prophylaxis: enoxaparin (LOVENOX) injection 40 mg Start: 11/30/2020 2200 Place TED hose Start: 12/12/2020 1632     Code Status: Partial Code: Okay with intubation and mechanical ventilation but no chest compressions  Family Communication: None Disposition Plan:    Status is: Inpatient  Remains inpatient appropriate because:Unsafe d/c plan and Inpatient level of care appropriate due to severity of illness   Dispo: The patient is from: Home              Anticipated d/c is to: SNF              Anticipated d/c date is: > 3 days              Patient currently is not medically stable to d/c.           Subjective:   Interval events noted.  He was coughing repeatedly while I was at the bedside.  No shortness of breath.  Objective:    Vitals:   12/16/20 0416 12/16/20 0800 12/16/20 0819 12/16/20 1152  BP: 134/74 128/66  113/66  Pulse: 60 60  66  Resp: 17   19  Temp: 98 F (36.7 C)  98.8 F (37.1 C)  98.3 F (36.8 C)  TempSrc: Oral Oral  Oral  SpO2: 94% 91% 90% 93%  Weight:      Height:       No data found.   Intake/Output Summary (Last 24 hours) at 12/16/2020 1442 Last data filed at 12/15/2020 2025 Gross per 24 hour  Intake --  Output 1000 ml  Net -1000 ml   Filed Weights   12/10/20 0902 12/14/20  1835 12/15/20 0246  Weight: 74.9 kg 72.5 kg 72.5 kg    Exam:  GEN: NAD SKIN: No rash EYES: EOMI ENT: MMM CV: RRR PULM: CTA B ABD: soft, ND, NT, +BS CNS: AAO x 3, non focal EXT: No edema or tenderness        Data Reviewed:   I have personally reviewed following labs and imaging studies:  Labs: Labs show the following:   Basic Metabolic Panel: Recent Labs  Lab 12/10/20 0412 12/11/20 0312 12/12/20 0315 12/13/20 1007 12/14/20 0649 12/16/20 0605  NA 144 148* 149* 148* 147* 147*  K 4.5 4.9 4.4 4.0 4.3 4.3  CL 108 112* 114* 110 110 108  CO2 23 27 26 28 26 29   GLUCOSE 221* 234* 206* 133* 157* 93  BUN 71* 71* 57* 41* 43* 45*  CREATININE 1.28* 1.14 0.97 0.95 0.92 0.94  CALCIUM 8.4* 8.5* 8.6* 8.8* 8.6* 8.9  MG 2.6*  --   --   --  2.2  --   PHOS  --   --   --   --  3.6  --    GFR Estimated Creatinine Clearance: 65.3 mL/min (by C-G formula based on SCr of 0.94 mg/dL). Liver Function Tests: Recent Labs  Lab 12/10/20 0412 12/11/20 0312 12/12/20 0315  AST 27 31 24   ALT 18 24 23   ALKPHOS 34* 43 47  BILITOT 0.8 0.8 0.8  PROT 6.2* 6.6 6.3*  ALBUMIN 2.7* 2.9* 2.7*   No results for input(s): LIPASE, AMYLASE in the last 168 hours. No results for input(s): AMMONIA in the last 168 hours. Coagulation profile No results for input(s): INR, PROTIME in the last 168 hours.  CBC: Recent Labs  Lab 12/10/20 0412 12/11/20 0312 12/12/20 0315  WBC 17.5* 10.6* 10.2  NEUTROABS 16.4* 9.6* 9.2*  HGB 10.3* 10.1* 10.2*  HCT 30.8* 31.2* 30.8*  MCV 94.5 95.7 94.8  PLT 135* 162 169   Cardiac Enzymes: No results for input(s): CKTOTAL, CKMB,  CKMBINDEX, TROPONINI in the last 168 hours. BNP (last 3 results) No results for input(s): PROBNP in the last 8760 hours. CBG: Recent Labs  Lab 12/15/20 1255 12/15/20 1655 12/15/20 2026 12/16/20 0828 12/16/20 1120  GLUCAP 177* 373* 320* 142* 131*   D-Dimer: No results for input(s): DDIMER in the last 72 hours. Hgb A1c: No results for input(s): HGBA1C in the last 72 hours. Lipid Profile: No results for input(s): CHOL, HDL, LDLCALC, TRIG, CHOLHDL, LDLDIRECT in the last 72 hours. Thyroid function studies: No results for input(s): TSH, T4TOTAL, T3FREE, THYROIDAB in the last 72 hours.  Invalid input(s): FREET3 Anemia work up: No results for input(s): VITAMINB12, FOLATE, FERRITIN, TIBC, IRON, RETICCTPCT in the last 72 hours. Sepsis Labs: Recent Labs  Lab 12/10/20 0412 12/11/20 0312 12/12/20 0315  WBC 17.5* 10.6* 10.2    Microbiology Recent Results (from the past 240 hour(s))  Culture, blood (Routine x 2)     Status: None   Collection Time: 12/22/2020 12:21 PM   Specimen: BLOOD  Result Value Ref Range Status   Specimen Description BLOOD RIGHT ANTECUBITAL  Final   Special Requests   Final    BOTTLES DRAWN AEROBIC AND ANAEROBIC Blood Culture adequate volume   Culture   Final    NO GROWTH 5 DAYS Performed at [facility], [address] [place], [place] [address]    Report Status 12/12/2020 FINAL  Final  Culture, blood (Routine x 2)     Status: None   Collection Time: 12/10/2020 12:21  PM   Specimen: BLOOD  Result Value Ref Range Status   Specimen Description BLOOD LEFT ANTECUBITAL  Final   Special Requests   Final    BOTTLES DRAWN AEROBIC AND ANAEROBIC Blood Culture adequate volume   Culture   Final    NO GROWTH 5 DAYS Performed at [facility], [address] [place], [place] [address]    Report Status 12/12/2020 FINAL  Final  MRSA PCR Screening     Status: None   Collection Time: 12/10/20  9:05 AM   Specimen: Nasopharyngeal  Result Value Ref  Range Status   MRSA by PCR NEGATIVE NEGATIVE Final    Comment:        The GeneXpert MRSA Assay (FDA approved for NASAL specimens only), is one component of a comprehensive MRSA colonization surveillance program. It is not intended to diagnose MRSA infection nor to guide or monitor treatment for MRSA infections. Performed at [facility], [address] [place], [place] [address]     Procedures and diagnostic studies:  No results found.             LOS: 9 days   Dexton Zwilling  [employer], advertising on [email or web address]. If 7PM-7AM, please contact night-coverage at [email or web address]     12/16/2020, 2:42 PM

## 2020-12-17 DIAGNOSIS — E870 Hyperosmolality and hypernatremia: Secondary | ICD-10-CM | POA: Diagnosis not present

## 2020-12-17 DIAGNOSIS — I152 Hypertension secondary to endocrine disorders: Secondary | ICD-10-CM

## 2020-12-17 DIAGNOSIS — U071 COVID-19: Secondary | ICD-10-CM | POA: Diagnosis not present

## 2020-12-17 DIAGNOSIS — E1159 Type 2 diabetes mellitus with other circulatory complications: Secondary | ICD-10-CM | POA: Diagnosis not present

## 2020-12-17 DIAGNOSIS — J9601 Acute respiratory failure with hypoxia: Secondary | ICD-10-CM | POA: Diagnosis not present

## 2020-12-17 LAB — GLUCOSE, CAPILLARY
Glucose-Capillary: 84 mg/dL (ref 70–99)
Glucose-Capillary: 214 mg/dL — ABNORMAL HIGH (ref 70–99)
Glucose-Capillary: 127 mg/dL — ABNORMAL HIGH (ref 70–99)
Glucose-Capillary: 132 mg/dL — ABNORMAL HIGH (ref 70–99)

## 2020-12-17 MED ORDER — LORAZEPAM 2 MG/ML IJ SOLN
1.0000 mg | Freq: Once | INTRAMUSCULAR | Status: AC
  Administered 2020-12-17: 1 mg via INTRAVENOUS
  Filled 2020-12-17: qty 1

## 2020-12-17 NOTE — Progress Notes (Signed)
Progress Note    Paul Parker  BMW:[id number] DOB: [birth year]  DOA: 26-Dec-2020 PCP: Volney American, PA-C      Brief Narrative:    Medical records reviewed and are as summarized below:  Paul Parker is a 80 y.o. male medical history significant forhypertension, hyperlipidemia, coronary artery disease, history of prostate cancer, hypothyroid, depression/anxiety, history of atrial fibrillation converted to sinus rhythm on amiodarone in2013/2014, presented to the emergency department for chief concerns of worsening shortness of breath. He was diagnosed with COVID-19 on 11/27/2020.  He has received first and second dose of COVID-vaccine but has not received a booster yet.   He was admitted to the hospital for [id number] pneumonia complicated by acute hypoxic respiratory failure.   Assessment/Plan:   Active Problems:   Hypertension associated with diabetes ([place])   Acute hypoxemic respiratory failure due to COVID-19 (HCC)   Prolonged QT interval   Hypernatremia   Atrial fibrillation (HCC)   Severe protein-calorie malnutrition (HCC)   Malnutrition of moderate degree   Pneumonia due to COVID-19 virus Type II DM with hyperglycemia  Nutrition Problem: Moderate Malnutrition Etiology: social / environmental circumstances (suspected inadequate oral intake now complicated by acute YQIHK-74 infection)  Signs/Symptoms: moderate fat depletion,moderate muscle depletion,severe muscle depletion   Body mass index is 21.39 kg/m.    PLAN  COVID-19 pneumonia: Completed IV remdesivir on 12/11/2020.  Completed prednisone on 12/17/2020.  Completed Omnicef on 12/16/2020.  Continue baricitinib.  No evidence of pulmonary embolism on CTA chest.  Severe acute hypoxic respiratory failure: Difficulty with weaning oxygen.  Continue weaning attempts.  He remains on oxygen via heated humidified high flow nasal cannula (FiO2 100% and flow rate 55 L/min)  CAD: Continue aspirin, Plavix and  Lipitor  Paroxysmal atrial fibrillation: Continue amiodarone  Hypotension: BP is better.  Isosorbide mononitrate, amlodipine and enalapril have been discontinued  Continue Lantus and NovoLog as needed for hyperglycemia.  Hemoglobin A1c 7.7.  Hypernatremia: Sodium level is stable at 147.  Continue to monitor. Encouraged oral hydration.  Right lower extremity swelling: No evidence of DVT on venous duplex.  Elevated troponin: This likely due to demand ischemia.  Comorbidities include anxiety, depression, hypothyroidism        Diet Order            DIET DYS 2 Room service appropriate? Yes with Assist; Fluid consistency: Nectar Thick  Diet effective now                    Consultants:  Palliative care team  Procedures:  None    Medications:   . amiodarone  100 mg Oral Daily  . aspirin EC  81 mg Oral Daily  . atorvastatin  40 mg Oral Daily  . baricitinib  4 mg Oral Daily  . chlorpheniramine-HYDROcodone  5 mL Oral Q12H  . clopidogrel  75 mg Oral Daily  . DULoxetine  30 mg Oral Daily  . enoxaparin (LOVENOX) injection  40 mg Subcutaneous Q24H  . feeding supplement  237 mL Oral BID BM  . ferrous sulfate  325 mg Oral Q breakfast  . insulin aspart  0-15 Units Subcutaneous TID WC  . insulin aspart  0-5 Units Subcutaneous QHS  . insulin glargine  10 Units Subcutaneous Daily  . levothyroxine  75 mcg Oral Q0600  . multivitamin with minerals  1 tablet Oral Daily  . senna-docusate  1 tablet Oral QHS   Continuous Infusions:   Anti-infectives (From admission, onward)   Start  Dose/Rate Route Frequency Ordered Stop   12/13/20 0800  cefdinir (OMNICEF) capsule 300 mg        300 mg Oral Every 12 hours 12/12/20 1146 12/16/20 2316   12/12/20 1800  cefdinir (OMNICEF) capsule 300 mg  Status:  Discontinued        300 mg Oral Every 12 hours 12/12/20 1135 12/12/20 1146   12/10/20 1000  cefTRIAXone (ROCEPHIN) 1 g in sodium chloride 0.9 % 100 mL IVPB  Status:  Discontinued         1 g 200 mL/hr over 30 Minutes Intravenous Every 24 hours 12/10/20 0911 12/12/20 1135   12/08/20 1000  remdesivir 100 mg in sodium chloride 0.9 % 100 mL IVPB       "Followed by" Linked Group Details   100 mg 200 mL/hr over 30 Minutes Intravenous Daily 12-31-2020 1338 12/11/20 1020   31-Dec-2020 2345  doxycycline (VIBRAMYCIN) 100 mg in sodium chloride 0.9 % 250 mL IVPB  Status:  Discontinued        100 mg 125 mL/hr over 120 Minutes Intravenous Every 12 hours 2020/12/31 2311 12/08/20 1053   December 31, 2020 2315  azithromycin (ZITHROMAX) 500 mg in sodium chloride 0.9 % 250 mL IVPB  Status:  Discontinued        500 mg 250 mL/hr over 60 Minutes Intravenous Every 24 hours 12-31-2020 2307 12/31/20 2311   31-Dec-2020 2315  cefTRIAXone (ROCEPHIN) 1 g in sodium chloride 0.9 % 100 mL IVPB  Status:  Discontinued        1 g 200 mL/hr over 30 Minutes Intravenous Every 24 hours 12/31/2020 2307 12/08/20 1053   2020-12-31 1500  remdesivir 200 mg in sodium chloride 0.9% 250 mL IVPB       "Followed by" Linked Group Details   200 mg 580 mL/hr over 30 Minutes Intravenous Once 12-31-2020 1338 2020-12-31 1455             Family Communication/Anticipated D/C date and plan/Code Status   DVT prophylaxis: enoxaparin (LOVENOX) injection 40 mg Start: 12-31-2020 2200 Place TED hose Start: December 31, 2020 1632     Code Status: Partial Code: Okay with intubation and mechanical ventilation but no chest compressions  Family Communication: None Disposition Plan:    Status is: Inpatient  Remains inpatient appropriate because:Unsafe d/c plan and Inpatient level of care appropriate due to severity of illness   Dispo: The patient is from: Home              Anticipated d/c is to: SNF              Anticipated d/c date is: > 3 days              Patient currently is not medically stable to d/c.           Subjective:   Interval events noted.  He still has a cough.  He is short of breath with exertion.  Objective:    Vitals:    12/16/20 2300 12/17/20 0349 12/17/20 0730 12/17/20 1254  BP:  127/72 (!) 149/69 (!) 152/71  Pulse:  (!) 59 90 80  Resp:  18    Temp:  98.7 F (37.1 C) 99 F (37.2 C) 99.1 F (37.3 C)  TempSrc:  Oral Axillary Axillary  SpO2: 91% 92% 94% 94%  Weight:  69.6 kg    Height:       No data found.  No intake or output data in the 24 hours ending 12/17/20 [place]  12/14/20 1835 12/15/20 0246 12/17/20 0349  Weight: 72.5 kg 72.5 kg 69.6 kg    Exam:  GEN: NAD SKIN: No rash EYES: EOMI ENT: MMM CV: RRR PULM: CTA B ABD: soft, ND, NT, +BS CNS: AAO x 3, non focal EXT: No edema or tenderness        Data Reviewed:   I have personally reviewed following labs and imaging studies:  Labs: Labs show the following:   Basic Metabolic Panel: Recent Labs  Lab 12/11/20 0312 12/12/20 0315 12/13/20 1007 12/14/20 0649 12/16/20 0605  NA 148* 149* 148* 147* 147*  K 4.9 4.4 4.0 4.3 4.3  CL 112* 114* 110 110 108  CO2 27 26 28 26 29   GLUCOSE 234* 206* 133* 157* 93  BUN 71* 57* 41* 43* 45*  CREATININE 1.14 0.97 0.95 0.92 0.94  CALCIUM 8.5* 8.6* 8.8* 8.6* 8.9  MG  --   --   --  2.2  --   PHOS  --   --   --  3.6  --    GFR Estimated Creatinine Clearance: 62.7 mL/min (by C-G formula based on SCr of 0.94 mg/dL). Liver Function Tests: Recent Labs  Lab 12/11/20 0312 12/12/20 0315  AST 31 24  ALT 24 23  ALKPHOS 43 47  BILITOT 0.8 0.8  PROT 6.6 6.3*  ALBUMIN 2.9* 2.7*   No results for input(s): LIPASE, AMYLASE in the last 168 hours. No results for input(s): AMMONIA in the last 168 hours. Coagulation profile No results for input(s): INR, PROTIME in the last 168 hours.  CBC: Recent Labs  Lab 12/11/20 0312 12/12/20 0315  WBC 10.6* 10.2  NEUTROABS 9.6* 9.2*  HGB 10.1* 10.2*  HCT 31.2* 30.8*  MCV 95.7 94.8  PLT 162 169   Cardiac Enzymes: No results for input(s): CKTOTAL, CKMB, CKMBINDEX, TROPONINI in the last 168 hours. BNP (last 3 results) No results for  input(s): PROBNP in the last 8760 hours. CBG: Recent Labs  Lab 12/16/20 1120 12/16/20 1704 12/16/20 2301 12/17/20 0852 12/17/20 1241  GLUCAP 131* 382* 285* 84 214*   D-Dimer: No results for input(s): DDIMER in the last 72 hours. Hgb A1c: No results for input(s): HGBA1C in the last 72 hours. Lipid Profile: No results for input(s): CHOL, HDL, LDLCALC, TRIG, CHOLHDL, LDLDIRECT in the last 72 hours. Thyroid function studies: No results for input(s): TSH, T4TOTAL, T3FREE, THYROIDAB in the last 72 hours.  Invalid input(s): FREET3 Anemia work up: No results for input(s): VITAMINB12, FOLATE, FERRITIN, TIBC, IRON, RETICCTPCT in the last 72 hours. Sepsis Labs: Recent Labs  Lab 12/11/20 0312 12/12/20 0315  WBC 10.6* 10.2    Microbiology Recent Results (from the past 240 hour(s))  MRSA PCR Screening     Status: None   Collection Time: 12/10/20  9:05 AM   Specimen: Nasopharyngeal  Result Value Ref Range Status   MRSA by PCR NEGATIVE NEGATIVE Final    Comment:        The GeneXpert MRSA Assay (FDA approved for NASAL specimens only), is one component of a comprehensive MRSA colonization surveillance program. It is not intended to diagnose MRSA infection nor to guide or monitor treatment for MRSA infections. Performed at [facility], [address] [place], [place] [address]     Procedures and diagnostic studies:  No results found.             LOS: 10 days   Vash Quezada  [employer]   Pager on [email or web address]. If 7PM-7AM, please contact  night-coverage at [email or web address]     12/17/2020, 1:48 PM

## 2020-12-17 NOTE — Plan of Care (Signed)
  Problem: Clinical Measurements: Goal: Respiratory complications will improve Outcome: Not Progressing  Pt becoming agitated and beginning to pull at HFNC and nonrebreather. Once off, pt begins to desat

## 2020-12-18 DIAGNOSIS — E870 Hyperosmolality and hypernatremia: Secondary | ICD-10-CM | POA: Diagnosis not present

## 2020-12-18 DIAGNOSIS — Z7189 Other specified counseling: Secondary | ICD-10-CM | POA: Diagnosis not present

## 2020-12-18 DIAGNOSIS — J9601 Acute respiratory failure with hypoxia: Secondary | ICD-10-CM | POA: Diagnosis not present

## 2020-12-18 DIAGNOSIS — Z515 Encounter for palliative care: Secondary | ICD-10-CM | POA: Diagnosis not present

## 2020-12-18 DIAGNOSIS — E1159 Type 2 diabetes mellitus with other circulatory complications: Secondary | ICD-10-CM | POA: Diagnosis not present

## 2020-12-18 DIAGNOSIS — U071 COVID-19: Secondary | ICD-10-CM | POA: Diagnosis not present

## 2020-12-18 LAB — CBC
HCT: 37.9 % — ABNORMAL LOW (ref 39.0–52.0)
Hemoglobin: 12.4 g/dL — ABNORMAL LOW (ref 13.0–17.0)
MCH: 31.2 pg (ref 26.0–34.0)
MCHC: 32.7 g/dL (ref 30.0–36.0)
MCV: 95.2 fL (ref 80.0–100.0)
Platelets: 200 10*3/uL (ref 150–400)
RBC: 3.98 MIL/uL — ABNORMAL LOW (ref 4.22–5.81)
RDW: 13.6 % (ref 11.5–15.5)
WBC: 13.8 10*3/uL — ABNORMAL HIGH (ref 4.0–10.5)
nRBC: 0 % (ref 0.0–0.2)

## 2020-12-18 LAB — GLUCOSE, CAPILLARY
Glucose-Capillary: 101 mg/dL — ABNORMAL HIGH (ref 70–99)
Glucose-Capillary: 264 mg/dL — ABNORMAL HIGH (ref 70–99)
Glucose-Capillary: 58 mg/dL — ABNORMAL LOW (ref 70–99)
Glucose-Capillary: 78 mg/dL (ref 70–99)
Glucose-Capillary: 179 mg/dL — ABNORMAL HIGH (ref 70–99)

## 2020-12-18 MED ORDER — DEXTROSE 50 % IV SOLN
INTRAVENOUS | Status: AC
  Filled 2020-12-18: qty 50

## 2020-12-18 NOTE — Progress Notes (Addendum)
Progress Note    Paul Parker  LPF:[id number] DOB: 06/05/1941  DOA: 12/12/2020 PCP: Volney American, PA-C      Brief Narrative:    Medical records reviewed and are as summarized below:  Paul Parker is a 80 y.o. male medical history significant forhypertension, hyperlipidemia, coronary artery disease, history of prostate cancer, hypothyroid, depression/anxiety, history of atrial fibrillation converted to sinus rhythm on amiodarone in2013/2014, presented to the emergency department for chief concerns of worsening shortness of breath. He was diagnosed with COVID-19 on 11/27/2020.  He has received first and second dose of COVID-vaccine but has not received a booster yet.   He was admitted to the hospital for ZHGDJ-24 pneumonia complicated by acute hypoxic respiratory failure.   Assessment/Plan:   Active Problems:   Hypertension associated with diabetes ([place])   Acute hypoxemic respiratory failure due to COVID-19 (HCC)   Prolonged QT interval   Hypernatremia   Atrial fibrillation (HCC)   Severe protein-calorie malnutrition (HCC)   Malnutrition of moderate degree   Pneumonia due to COVID-19 virus Type II DM with hyperglycemia  Nutrition Problem: Moderate Malnutrition Etiology: social / environmental circumstances (suspected inadequate oral intake now complicated by acute QASTM-19 infection)  Signs/Symptoms: moderate fat depletion,moderate muscle depletion,severe muscle depletion   Body mass index is 21.28 kg/m.    PLAN  COVID-19 pneumonia: Completed IV remdesivir on 12/11/2020.  Completed prednisone on 12/17/2020.  Completed Omnicef on 12/16/2020.  Continue baricitinib.  No evidence of pulmonary embolism on CTA chest.  Severe acute hypoxic respiratory failure: Difficulty weaning oxygen.  He is not improving.  He is still on 100% oxygen via heated humidified high flow nasal cannula.    CAD: Continue aspirin, Plavix and Lipitor  Paroxysmal atrial fibrillation: Continue  amiodarone  Hypotension: BP is better.  Isosorbide mononitrate, amlodipine and enalapril have been discontinued  Continue Lantus and NovoLog as needed for hyperglycemia.  Hemoglobin A1c 7.7.  Hypernatremia: Sodium level is stable. Continue to monitor. Encouraged oral hydration.  Right lower extremity swelling: No evidence of DVT on venous duplex.  Elevated troponin: This likely due to demand ischemia.  Comorbidities include anxiety, depression, hypothyroidism  Plan discussed with his daughter-in-law, Dawn      Diet Order            DIET DYS 2 Room service appropriate? Yes with Assist; Fluid consistency: Nectar Thick  Diet effective now                    Consultants:  Palliative care team  Procedures:  None    Medications:   . amiodarone  100 mg Oral Daily  . aspirin EC  81 mg Oral Daily  . atorvastatin  40 mg Oral Daily  . baricitinib  4 mg Oral Daily  . chlorpheniramine-HYDROcodone  5 mL Oral Q12H  . clopidogrel  75 mg Oral Daily  . DULoxetine  30 mg Oral Daily  . enoxaparin (LOVENOX) injection  40 mg Subcutaneous Q24H  . feeding supplement  237 mL Oral BID BM  . ferrous sulfate  325 mg Oral Q breakfast  . insulin aspart  0-15 Units Subcutaneous TID WC  . insulin aspart  0-5 Units Subcutaneous QHS  . insulin glargine  10 Units Subcutaneous Daily  . levothyroxine  75 mcg Oral Q0600  . multivitamin with minerals  1 tablet Oral Daily  . senna-docusate  1 tablet Oral QHS   Continuous Infusions:   Anti-infectives (From admission, onward)   Start  Dose/Rate Route Frequency Ordered Stop   12/13/20 0800  cefdinir (OMNICEF) capsule 300 mg        300 mg Oral Every 12 hours 12/12/20 1146 12/16/20 2316   12/12/20 1800  cefdinir (OMNICEF) capsule 300 mg  Status:  Discontinued        300 mg Oral Every 12 hours 12/12/20 1135 12/12/20 1146   12/10/20 1000  cefTRIAXone (ROCEPHIN) 1 g in sodium chloride 0.9 % 100 mL IVPB  Status:  Discontinued        1 g 200  mL/hr over 30 Minutes Intravenous Every 24 hours 12/10/20 0911 12/12/20 1135   12/08/20 1000  remdesivir 100 mg in sodium chloride 0.9 % 100 mL IVPB       "Followed by" Linked Group Details   100 mg 200 mL/hr over 30 Minutes Intravenous Daily Jan 01, 2021 1338 12/11/20 1020   01-01-21 2345  doxycycline (VIBRAMYCIN) 100 mg in sodium chloride 0.9 % 250 mL IVPB  Status:  Discontinued        100 mg 125 mL/hr over 120 Minutes Intravenous Every 12 hours 2021-01-01 2311 12/08/20 1053   01-01-2021 2315  azithromycin (ZITHROMAX) 500 mg in sodium chloride 0.9 % 250 mL IVPB  Status:  Discontinued        500 mg 250 mL/hr over 60 Minutes Intravenous Every 24 hours 2021-01-01 2307 January 01, 2021 2311   01-Jan-2021 2315  cefTRIAXone (ROCEPHIN) 1 g in sodium chloride 0.9 % 100 mL IVPB  Status:  Discontinued        1 g 200 mL/hr over 30 Minutes Intravenous Every 24 hours January 01, 2021 2307 12/08/20 1053   January 01, 2021 1500  remdesivir 200 mg in sodium chloride 0.9% 250 mL IVPB       "Followed by" Linked Group Details   200 mg 580 mL/hr over 30 Minutes Intravenous Once 01/01/21 1338 01-Jan-2021 1455             Family Communication/Anticipated D/C date and plan/Code Status   DVT prophylaxis: enoxaparin (LOVENOX) injection 40 mg Start: 01-01-2021 2200 Place TED hose Start: 2021/01/01 1632     Code Status: Partial Code: Okay with intubation and mechanical ventilation but no chest compressions  Family Communication: Daughter-in-law, Dawn Disposition Plan:    Status is: Inpatient  Remains inpatient appropriate because:Unsafe d/c plan and Inpatient level of care appropriate due to severity of illness   Dispo: The patient is from: Home              Anticipated d/c is to: SNF              Anticipated d/c date is: > 3 days              Patient currently is not medically stable to d/c.           Subjective:   Interval events noted. He doesn't provide much history.  Objective:    Vitals:   12/17/20 1950 12/17/20  2056 12/18/20 0330 12/18/20 0757  BP:  127/73 119/83 125/62  Pulse:  64 85 85  Resp:  17 16   Temp:   98.8 F (37.1 C)   TempSrc:   Axillary   SpO2: 93% 100% 100% 96%  Weight:   69.2 kg   Height:       No data found.   Intake/Output Summary (Last 24 hours) at 12/18/2020 1533 Last data filed at 12/18/2020 0455 Gross per 24 hour  Intake --  Output 575 ml  Net -575 ml  Filed Weights   12/15/20 0246 12/17/20 0349 12/18/20 0330  Weight: 72.5 kg 69.6 kg 69.2 kg    Exam:   GEN: NAD SKIN: No rash EYES: EOMI ENT: MMM, on heated high flow oxygen CV: RRR PULM: CTA B ABD: soft, ND, NT, +BS CNS: AAO x 3, non focal EXT: No edema or tenderness      Data Reviewed:   I have personally reviewed following labs and imaging studies:  Labs: Labs show the following:   Basic Metabolic Panel: Recent Labs  Lab 12/12/20 0315 12/13/20 1007 12/14/20 0649 12/16/20 0605  NA 149* 148* 147* 147*  K 4.4 4.0 4.3 4.3  CL 114* 110 110 108  CO2 26 28 26 29   GLUCOSE 206* 133* 157* 93  BUN 57* 41* 43* 45*  CREATININE 0.97 0.95 0.92 0.94  CALCIUM 8.6* 8.8* 8.6* 8.9  MG  --   --  2.2  --   PHOS  --   --  3.6  --    GFR Estimated Creatinine Clearance: 62.4 mL/min (by C-G formula based on SCr of 0.94 mg/dL). Liver Function Tests: Recent Labs  Lab 12/12/20 0315  AST 24  ALT 23  ALKPHOS 47  BILITOT 0.8  PROT 6.3*  ALBUMIN 2.7*   No results for input(s): LIPASE, AMYLASE in the last 168 hours. No results for input(s): AMMONIA in the last 168 hours. Coagulation profile No results for input(s): INR, PROTIME in the last 168 hours.  CBC: Recent Labs  Lab 12/12/20 0315 12/18/20 0318  WBC 10.2 13.8*  NEUTROABS 9.2*  --   HGB 10.2* 12.4*  HCT 30.8* 37.9*  MCV 94.8 95.2  PLT 169 200   Cardiac Enzymes: No results for input(s): CKTOTAL, CKMB, CKMBINDEX, TROPONINI in the last 168 hours. BNP (last 3 results) No results for input(s): PROBNP in the last 8760 hours. CBG: Recent  Labs  Lab 12/17/20 1241 12/17/20 1727 12/17/20 2149 12/18/20 0734 12/18/20 1136  GLUCAP 214* 127* 132* 101* 264*   D-Dimer: No results for input(s): DDIMER in the last 72 hours. Hgb A1c: No results for input(s): HGBA1C in the last 72 hours. Lipid Profile: No results for input(s): CHOL, HDL, LDLCALC, TRIG, CHOLHDL, LDLDIRECT in the last 72 hours. Thyroid function studies: No results for input(s): TSH, T4TOTAL, T3FREE, THYROIDAB in the last 72 hours.  Invalid input(s): FREET3 Anemia work up: No results for input(s): VITAMINB12, FOLATE, FERRITIN, TIBC, IRON, RETICCTPCT in the last 72 hours. Sepsis Labs: Recent Labs  Lab 12/12/20 0315 12/18/20 0318  WBC 10.2 13.8*    Microbiology Recent Results (from the past 240 hour(s))  MRSA PCR Screening     Status: None   Collection Time: 12/10/20  9:05 AM   Specimen: Nasopharyngeal  Result Value Ref Range Status   MRSA by PCR NEGATIVE NEGATIVE Final    Comment:        The GeneXpert MRSA Assay (FDA approved for NASAL specimens only), is one component of a comprehensive MRSA colonization surveillance program. It is not intended to diagnose MRSA infection nor to guide or monitor treatment for MRSA infections. Performed at [facility], [address] [place], [place] [address]     Procedures and diagnostic studies:  No results found.             LOS: 11 days   [employer], advertising on [email or web address]. If 7PM-7AM, please contact night-coverage at [email or web address]     12/18/2020, 3:33 PM

## 2020-12-18 NOTE — Progress Notes (Signed)
Daily Progress Note   Patient Name: Paul Parker       Date: 12/18/2020 DOB: 1941-04-18  Age: 80 y.o. MRN#: [id number] Attending Physician: Jennye Boroughs, MD Primary Care Physician: Volney American, PA-C Admit Date: 11/30/2020  Reason for Consultation/Follow-up: Establishing goals of care  Subjective: Patient is on covid precautions. He has a Actuary at bedside. He sounds tired. He denies feeling any better or worse, and would like to continue to try to improve. Spoke with his wife. She states the family is being updated and that DIL Dawn is the primary contact for Paul Parker's care. She states she remains hopeful her husband will improve. Spoke with [employer] Dawns states they have been updated. She discusses that her husband came to bedside yesterday because of Paul Parker's agitation. She states she and her husband understand his tenuous status and oxygen needs. She states her mother in law and brother in law are hoping for improvement. Plans to continue current care.    Length of Stay: 11  Current Medications: Scheduled Meds:  . amiodarone  100 mg Oral Daily  . aspirin EC  81 mg Oral Daily  . atorvastatin  40 mg Oral Daily  . baricitinib  4 mg Oral Daily  . chlorpheniramine-HYDROcodone  5 mL Oral Q12H  . clopidogrel  75 mg Oral Daily  . DULoxetine  30 mg Oral Daily  . enoxaparin (LOVENOX) injection  40 mg Subcutaneous Q24H  . feeding supplement  237 mL Oral BID BM  . ferrous sulfate  325 mg Oral Q breakfast  . insulin aspart  0-15 Units Subcutaneous TID WC  . insulin aspart  0-5 Units Subcutaneous QHS  . insulin glargine  10 Units Subcutaneous Daily  . levothyroxine  75 mcg Oral Q0600  . multivitamin with minerals  1 tablet Oral Daily  . senna-docusate  1 tablet Oral QHS    Continuous  Infusions:   PRN Meds: acetaminophen, guaiFENesin-dextromethorphan, ondansetron **OR** ondansetron (ZOFRAN) IV, polyethylene glycol     Vital Signs: BP 125/62 (BP Location: Right Arm)   Pulse 85   Temp 98.8 F (37.1 C) (Axillary)   Resp 16   Ht 5\' 11"  (1.803 m)   Wt 69.2 kg   SpO2 96%   BMI 21.28 kg/m  SpO2: SpO2: 96 % O2 Device: O2 Device: High Flow  Nasal Cannula O2 Flow Rate: O2 Flow Rate (L/min): 55 L/min  Intake/output summary:   Intake/Output Summary (Last 24 hours) at 12/18/2020 1208 Last data filed at 12/18/2020 0455 Gross per 24 hour  Intake -  Output 575 ml  Net -575 ml   LBM: Last BM Date: 12/10/20 Baseline Weight: Weight: 79.4 kg Most recent weight: Weight: 69.2 kg        Flowsheet Rows   Flowsheet Row Most Recent Value  Intake Tab   Referral Department Hospitalist  Unit at Time of Referral ER  Palliative Care Primary Diagnosis Sepsis/Infectious Disease  Date Notified 12/11/20  Palliative Care Type New Palliative care  Reason for referral Clarify Goals of Care  Date of Admission 11/27/2020  Date first seen by Palliative Care 12/11/20  # of days Palliative referral response time 0 Day(s)  # of days IP prior to Palliative referral 4  Clinical Assessment   Psychosocial & Spiritual Assessment   Palliative Care Outcomes       Patient Active Problem List   Diagnosis Date Noted  . Pneumonia due to COVID-19 virus 12/16/2020  . Aortic atherosclerosis ([place]) 12/15/2020  . History of concussion 12/15/2020  . Hyperlipidemia associated with type 2 diabetes mellitus ([place]) 12/15/2020  . Malnutrition of moderate degree 12/13/2020  . Hypernatremia   . Atrial fibrillation ([place])   . Severe protein-calorie malnutrition ([place])   . Swelling of lower leg   . Acute hypoxemic respiratory failure due to COVID-19 ([place]) 11/29/2020  . Prolonged QT interval 12/11/2020  . SSS (sick sinus syndrome) ([place]) 04/05/2020  . 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 Memory changes  12/02/2019  . Recurrent major depressive disorder, in full remission ([place]) 09/30/2018  . Anemia 09/30/2018  . Advanced care planning/counseling discussion 10/28/2017  . Senile purpura ([place]) 08/30/2015  . CAD (coronary artery disease) 08/30/2015  . BPH (benign prostatic hyperplasia) 08/30/2015  . Hypothyroidism 08/30/2015  . Type 2 diabetes mellitus without obesity ([place])   . Hypertension associated with diabetes [facility])     Palliative Care Assessment & Plan    Recommendations/Plan:  Continue current care.    Code Status:    Code Status Orders  (From admission, onward)         Start     Ordered   12/15/20 1428  Limited resuscitation (code)  Continuous       Question Answer Comment  In the event of cardiac or respiratory ARREST: Initiate Code Blue, Call Rapid Response Yes   In the event of cardiac or respiratory ARREST: Perform CPR No   In the event of cardiac or respiratory ARREST: Perform Intubation/Mechanical Ventilation Yes   In the event of cardiac or respiratory ARREST: Use NIPPV/BiPAp only if indicated Yes   In the event of cardiac or respiratory ARREST: Administer ACLS medications if indicated Yes   In the event of cardiac or respiratory ARREST: Perform Defibrillation or Cardioversion if indicated Yes   Comments Intubate for respiratory distress/failure. In [place]      12/15/20 1427        Code Status History    Date Active Date Inactive Code Status Order ID Comments User Context   12/12/2020 1544 12/15/2020 1427 Partial Code [id number]  Asencion Gowda, NP Inpatient   12/12/2020 [id number] 12/12/2020 1544 Full Code [id number]  CoxBriant Cedar, DO ED   Advance Care Planning Activity       Prognosis:   < 6 months   Thank you for allowing the Palliative Medicine Team to  assist in the care of this patient.   Total Time 15 min Prolonged Time Billed  no      Greater than 50%  of this time was spent counseling and coordinating care related to the above assessment and  plan.  Asencion Gowda, NP  Please contact Palliative Medicine Team phone at [phone] for questions and concerns.

## 2020-12-19 ENCOUNTER — Encounter: Payer: Medicare Other | Admitting: Family Medicine

## 2020-12-19 DIAGNOSIS — E1159 Type 2 diabetes mellitus with other circulatory complications: Secondary | ICD-10-CM | POA: Diagnosis not present

## 2020-12-19 DIAGNOSIS — E440 Moderate protein-calorie malnutrition: Secondary | ICD-10-CM | POA: Diagnosis not present

## 2020-12-19 DIAGNOSIS — J9601 Acute respiratory failure with hypoxia: Secondary | ICD-10-CM | POA: Diagnosis not present

## 2020-12-19 DIAGNOSIS — U071 COVID-19: Secondary | ICD-10-CM | POA: Diagnosis not present

## 2020-12-19 LAB — GLUCOSE, CAPILLARY
Glucose-Capillary: 137 mg/dL — ABNORMAL HIGH (ref 70–99)
Glucose-Capillary: 276 mg/dL — ABNORMAL HIGH (ref 70–99)
Glucose-Capillary: 287 mg/dL — ABNORMAL HIGH (ref 70–99)
Glucose-Capillary: 255 mg/dL — ABNORMAL HIGH (ref 70–99)

## 2020-12-19 MED ORDER — NEPRO/CARBSTEADY PO LIQD
237.0000 mL | Freq: Three times a day (TID) | ORAL | Status: DC
  Administered 2020-12-19 – 2020-12-27 (×21): 237 mL via ORAL

## 2020-12-19 NOTE — Progress Notes (Addendum)
Progress Note    Paul Parker  [id number] DOB: [birth year]  DOA: 11/26/2020 PCP: Volney American, PA-C      Brief Narrative:    Medical records reviewed and are as summarized below:  Paul Parker is a 80 y.o. male medical history significant forhypertension, hyperlipidemia, coronary artery disease, history of prostate cancer, hypothyroid, depression/anxiety, history of atrial fibrillation converted to sinus rhythm on amiodarone in2013/2014, presented to the emergency department for chief concerns of worsening shortness of breath. He was diagnosed with COVID-19 on 11/27/2020.  He has received first and second dose of COVID-vaccine but has not received a booster yet.   He was admitted to the hospital for [id number] pneumonia complicated by acute hypoxic respiratory failure.  He was treated with cefepime and Cipro, steroids, Omnicef and baricitinib.  He also required high amount of oxygen via heated humidified high flow nasal cannula.   Assessment/Plan:   Active Problems:   Hypertension associated with diabetes ([place])   Acute hypoxemic respiratory failure due to COVID-19 (HCC)   Prolonged QT interval   Hypernatremia   Atrial fibrillation (HCC)   Severe protein-calorie malnutrition (HCC)   Malnutrition of moderate degree   Pneumonia due to COVID-19 virus Type II DM with hyperglycemia  Nutrition Problem: Moderate Malnutrition Etiology: social / environmental circumstances (suspected inadequate oral intake now complicated by acute [id number] infection)  Signs/Symptoms: moderate fat depletion,moderate muscle depletion,severe muscle depletion   Body mass index is 21.34 kg/m.    PLAN  COVID-19 pneumonia: Completed IV remdesivir on 12/11/2020.  Completed prednisone on 12/17/2020.  Completed Omnicef on 12/16/2020.  Continue baricitinib.  No evidence of pulmonary embolism on CTA chest.  Severe acute hypoxic respiratory failure: Difficulty weaning oxygen.  No progress has been  made thus far in terms of weaning oxygen.  Continue oxygen via heated humidified high flow nasal cannula. FiO2 is about 98 to 100% at 60 L/min.   CAD: Continue aspirin, Plavix and Lipitor  Paroxysmal atrial fibrillation: Continue amiodarone  Hypotension: BP is better.  Isosorbide mononitrate, amlodipine and enalapril have been discontinued  Continue Lantus and NovoLog as needed for hyperglycemia.  Hemoglobin A1c 7.7.  Hypernatremia: Sodium level is stable. Continue to monitor. Encouraged oral hydration.  Right lower extremity swelling: No evidence of DVT on venous duplex.  Elevated troponin: This likely due to demand ischemia.  Comorbidities include anxiety, depression, hypothyroidism       Diet Order            DIET DYS 2 Room service appropriate? Yes with Assist; Fluid consistency: Nectar Thick  Diet effective now                    Consultants:  Palliative care team  Procedures:  None    Medications:   . amiodarone  100 mg Oral Daily  . aspirin EC  81 mg Oral Daily  . atorvastatin  40 mg Oral Daily  . baricitinib  4 mg Oral Daily  . chlorpheniramine-HYDROcodone  5 mL Oral Q12H  . clopidogrel  75 mg Oral Daily  . DULoxetine  30 mg Oral Daily  . enoxaparin (LOVENOX) injection  40 mg Subcutaneous Q24H  . feeding supplement (NEPRO CARB STEADY)  237 mL Oral TID BM  . ferrous sulfate  325 mg Oral Q breakfast  . insulin aspart  0-15 Units Subcutaneous TID WC  . insulin aspart  0-5 Units Subcutaneous QHS  . insulin glargine  10 Units Subcutaneous Daily  . levothyroxine  75 mcg Oral Q0600  . multivitamin with minerals  1 tablet Oral Daily  . senna-docusate  1 tablet Oral QHS   Continuous Infusions:   Anti-infectives (From admission, onward)   Start     Dose/Rate Route Frequency Ordered Stop   12/13/20 0800  cefdinir (OMNICEF) capsule 300 mg        300 mg Oral Every 12 hours 12/12/20 1146 12/16/20 2316   12/12/20 1800  cefdinir (OMNICEF) capsule 300 mg   Status:  Discontinued        300 mg Oral Every 12 hours 12/12/20 1135 12/12/20 1146   12/10/20 1000  cefTRIAXone (ROCEPHIN) 1 g in sodium chloride 0.9 % 100 mL IVPB  Status:  Discontinued        1 g 200 mL/hr over 30 Minutes Intravenous Every 24 hours 12/10/20 0911 12/12/20 1135   12/08/20 1000  remdesivir 100 mg in sodium chloride 0.9 % 100 mL IVPB       "Followed by" Linked Group Details   100 mg 200 mL/hr over 30 Minutes Intravenous Daily 12/22/2020 1338 12/11/20 1020   12/11/2020 2345  doxycycline (VIBRAMYCIN) 100 mg in sodium chloride 0.9 % 250 mL IVPB  Status:  Discontinued        100 mg 125 mL/hr over 120 Minutes Intravenous Every 12 hours 12/02/2020 2311 12/08/20 1053   12/11/2020 2315  azithromycin (ZITHROMAX) 500 mg in sodium chloride 0.9 % 250 mL IVPB  Status:  Discontinued        500 mg 250 mL/hr over 60 Minutes Intravenous Every 24 hours 12/04/2020 2307 11/26/2020 2311   12/19/2020 2315  cefTRIAXone (ROCEPHIN) 1 g in sodium chloride 0.9 % 100 mL IVPB  Status:  Discontinued        1 g 200 mL/hr over 30 Minutes Intravenous Every 24 hours 12/08/2020 2307 12/08/20 1053   12/06/2020 1500  remdesivir 200 mg in sodium chloride 0.9% 250 mL IVPB       "Followed by" Linked Group Details   200 mg 580 mL/hr over 30 Minutes Intravenous Once 12/22/2020 1338 12/05/2020 1455             Family Communication/Anticipated D/C date and plan/Code Status   DVT prophylaxis: enoxaparin (LOVENOX) injection 40 mg Start: 12/19/2020 2200 Place TED hose Start: 12/06/2020 1632     Code Status: Partial Code: Okay with intubation and mechanical ventilation but no chest compressions  Family Communication: None Disposition Plan:    Status is: Inpatient  Remains inpatient appropriate because:Unsafe d/c plan and Inpatient level of care appropriate due to severity of illness   Dispo: The patient is from: Home              Anticipated d/c is to: SNF              Anticipated d/c date is: > 3 days               Patient currently is not medically stable to d/c.           Subjective:   Interval events noted.  He denies any complaints.  He said he feels okay.      Objective:    Vitals:   12/18/20 2036 12/19/20 0332 12/19/20 0811 12/19/20 1200  BP: 111/73 (!) 86/62 110/69 134/65  Pulse: 62 (!) 56 71 65  Resp: 18 18 20 19   Temp: 97.9 F (36.6 C) 98.7 F (37.1 C) 97.8 F (36.6 C) 98.3 F (36.8 C)  TempSrc: Oral  SpO2: 100% 100% 98% 98%  Weight:  69.4 kg    Height:       No data found.   Intake/Output Summary (Last 24 hours) at 12/19/2020 1524 Last data filed at 12/19/2020 1300 Gross per 24 hour  Intake 360 ml  Output 250 ml  Net 110 ml   Filed Weights   12/17/20 0349 12/18/20 0330 12/19/20 0332  Weight: 69.6 kg 69.2 kg 69.4 kg    Exam:  GEN: NAD SKIN: Warm and dry EYES: EOMI ENT: MMM CV: RRR PULM: CTA B ABD: soft, ND, NT, +BS CNS: AAO x 3, non focal EXT: No edema or tenderness      Data Reviewed:   I have personally reviewed following labs and imaging studies:  Labs: Labs show the following:   Basic Metabolic Panel: Recent Labs  Lab 12/13/20 1007 12/14/20 0649 12/16/20 0605  NA 148* 147* 147*  K 4.0 4.3 4.3  CL 110 110 108  CO2 28 26 29   GLUCOSE 133* 157* 93  BUN 41* 43* 45*  CREATININE 0.95 0.92 0.94  CALCIUM 8.8* 8.6* 8.9  MG  --  2.2  --   PHOS  --  3.6  --    GFR Estimated Creatinine Clearance: 62.6 mL/min (by C-G formula based on SCr of 0.94 mg/dL). Liver Function Tests: No results for input(s): AST, ALT, ALKPHOS, BILITOT, PROT, ALBUMIN in the last 168 hours. No results for input(s): LIPASE, AMYLASE in the last 168 hours. No results for input(s): AMMONIA in the last 168 hours. Coagulation profile No results for input(s): INR, PROTIME in the last 168 hours.  CBC: Recent Labs  Lab 12/18/20 0318  WBC 13.8*  HGB 12.4*  HCT 37.9*  MCV 95.2  PLT 200   Cardiac Enzymes: No results for input(s): CKTOTAL, CKMB, CKMBINDEX,  TROPONINI in the last 168 hours. BNP (last 3 results) No results for input(s): PROBNP in the last 8760 hours. CBG: Recent Labs  Lab 12/18/20 1629 12/18/20 1701 12/18/20 2027 12/19/20 0800 12/19/20 1139  GLUCAP 58* 78 179* 137* 276*   D-Dimer: No results for input(s): DDIMER in the last 72 hours. Hgb A1c: No results for input(s): HGBA1C in the last 72 hours. Lipid Profile: No results for input(s): CHOL, HDL, LDLCALC, TRIG, CHOLHDL, LDLDIRECT in the last 72 hours. Thyroid function studies: No results for input(s): TSH, T4TOTAL, T3FREE, THYROIDAB in the last 72 hours.  Invalid input(s): FREET3 Anemia work up: No results for input(s): VITAMINB12, FOLATE, FERRITIN, TIBC, IRON, RETICCTPCT in the last 72 hours. Sepsis Labs: Recent Labs  Lab 12/18/20 0318  WBC 13.8*    Microbiology Recent Results (from the past 240 hour(s))  MRSA PCR Screening     Status: None   Collection Time: 12/10/20  9:05 AM   Specimen: Nasopharyngeal  Result Value Ref Range Status   MRSA by PCR NEGATIVE NEGATIVE Final    Comment:        The GeneXpert MRSA Assay (FDA approved for NASAL specimens only), is one component of a comprehensive MRSA colonization surveillance program. It is not intended to diagnose MRSA infection nor to guide or monitor treatment for MRSA infections. Performed at [facility], [address] [place], [place] [address]     Procedures and diagnostic studies:  No results found.             LOS: 12 days   [employer] Copywriter, advertising on [email or web address]. If 7PM-7AM, please contact night-coverage at [email or web address]  12/19/2020, 3:24 PM

## 2020-12-19 NOTE — Progress Notes (Signed)
Inpatient Diabetes Program Recommendations  AACE/ADA: New Consensus Statement on Inpatient Glycemic Control   Target Ranges:  Prepandial:   less than 140 mg/dL      Peak postprandial:   less than 180 mg/dL (1-2 hours)      Critically ill patients:  140 - 180 mg/dL   Results for [facility] (MRN [id number]) as of 12/19/2020 10:11  Ref. Range 12/18/2020 07:34 12/18/2020 11:36 12/18/2020 16:29 12/18/2020 17:01 12/18/2020 20:27 12/19/2020 08:00 12/19/2020 8:45  Glucose-Capillary Latest Ref Range: 70 - 99 mg/dL 101 (H) 264 (H)  Novolog 8 units  Lantus 10 units 58 (L) 78 179 (H) 137 (H)      Lantus 10 units   Review of Glycemic Control  Diabetes history: DM2 Outpatient Diabetes medications: Metformin 500 mg BID, Actos 45 mg daily Current orders for Inpatient glycemic control: Lantus 10 units daily, Novolog 0-15 units TID with meals, Novolog 0-5 units QHS  Inpatient Diabetes Program Recommendations:    Insulin: Please consider decreasing Novolog correction to sensitive scale (0-9 units TID).  Thanks, Barnie Alderman, RN, MSN, CDE Diabetes Coordinator Inpatient Diabetes Program [phone] (Team Pager from 8am to 5pm)

## 2020-12-19 NOTE — Progress Notes (Signed)
Nutrition Follow-up  DOCUMENTATION CODES:   Non-severe (moderate) malnutrition in context of social or environmental circumstances  INTERVENTION:  D/c Ensure Enlive  Nepro Shake po TID, each supplement provides 425 kcal and 19 grams protein  Continue Magic cup TID with meals, each supplement provides 290 kcal and 9 grams of protein  Continue MVI po daily  NUTRITION DIAGNOSIS:   Moderate Malnutrition related to social / environmental circumstances (suspected inadequate oral intake now complicated by acute [id number] infection) as evidenced by moderate fat depletion,moderate muscle depletion,severe muscle depletion. -ongoing   GOAL:   Patient will meet greater than or equal to 90% of their needs -unmet  MONITOR:   PO intake,Supplement acceptance,Labs,Weight trends,I & O's  REASON FOR ASSESSMENT:   Consult Assessment of nutrition requirement/status  ASSESSMENT:   80 year old male with PMHx of CAD, DM, HLD, HTN admitted with COVID-19.  RD working remotely.  1/21 - diet downgraded to dysphagia 2 with nectar thick by SLP  Pt remains on 100% oxygen via heated high flow nasal canula.  RD attempted to contact pt via phone this afternoon for nutrition follow-up, however he did not answer. Pt with varying intake, eating 25-100% of meals. Pt continues to receive Ensure supplement, will discontinue and order Nepro to provide supplement with nectar thick consistency.   Weight 69.4 kg today down 5.5kg (12 lbs) from 74.9 kg on 1/16; significant (7.3%)  Medications reviewed and include: Tussionex, Cymbalta, Ferrous sulfate, SSI, Lantus 10 units daily, MVI, senokot  Labs: CBGs 276,137,179,78,58 x 24 hrs   Diet Order:   Diet Order            DIET DYS 2 Room service appropriate? Yes with Assist; Fluid consistency: Nectar Thick  Diet effective now                 EDUCATION NEEDS:   No education needs have been identified at this time  Skin:  Skin Assessment: Reviewed RN  Assessment  Last BM:  12/10/2020 per chart  Height:   Ht Readings from Last 1 Encounters:  12/14/20 5\' 11"  (1.803 m)    Weight:   Wt Readings from Last 1 Encounters:  12/19/20 69.4 kg     BMI:  Body mass index is 21.34 kg/m.  Estimated Nutritional Needs:   Kcal:  1950-2150  Protein:  98-108 grams  Fluid:  1.9 L/day   Lajuan Lines, RD, LDN Clinical Nutrition After Hours/Weekend Pager # in [place]

## 2020-12-19 NOTE — Progress Notes (Signed)
Physical Therapy Treatment Patient Details Name: Paul Parker MRN: [id number] DOB: 06/17/1941 Today's Date: 12/19/2020    History of Present Illness presented to ER secondary to progressive SOB; admitted for management of acute hypoxic/hypoxemic respiratory failure due to COVID-19    PT Comments    Patient sitting up in chair on arrival to room with sitter present in room. Patient required Mod A for sit to stand transfer from chair with cues for hand placement and technique. Sp02 99% after standing activity. Patient has poor standing balance and required Min A- Mod A for midline with standing tolerance of less than 30 seconds. Patient participated in LE seated therapeutic exercises with BLE for strengthening with Sp02 99%. Recommend to continue PT to maximize independence and address remaining functional limitations. SNF is recommended at discharge.     Follow Up Recommendations  SNF     Equipment Recommendations   (to be determined at next level of care)    Recommendations for Other Services       Precautions / Restrictions Precautions Precautions: Fall Precaution Comments: currently patient has a sitter in the room Restrictions Weight Bearing Restrictions: No    Mobility  Bed Mobility               General bed mobility comments: did not assess as patient sitting up on arrival and post session  Transfers Overall transfer level: Needs assistance   Transfers: Sit to/from Stand Sit to Stand: Mod assist         General transfer comment: lifting and lowering assistance provided for standing. verbal cues for hand placement and technique  Ambulation/Gait             General Gait Details: not assessed due to poor standing balance   Stairs             Wheelchair Mobility    Modified Rankin (Stroke Patients Only)       Balance Overall balance assessment: Needs assistance Sitting-balance support: No upper extremity supported;Feet supported Sitting  balance-Leahy Scale: Good     Standing balance support: Single extremity supported Standing balance-Leahy Scale: Poor Standing balance comment: patient required Min A- Mod A for standing balance with posterior lean                            Cognition Arousal/Alertness: Awake/alert Behavior During Therapy: WFL for tasks assessed/performed Overall Cognitive Status: Impaired/Different from baseline                                 General Comments: patient able to follow single step commands consistently. patient confused about recent events      Exercises General Exercises - Lower Extremity Long Arc Quad: AAROM;Strengthening;Both;10 reps;Seated (2 sets of 10 reps) Hip Flexion/Marching: AAROM;Strengthening;Both;10 reps;Seated (2 sets of 10 reps) Other Exercises Other Exercises: verbal and visual cues for exercise technique    General Comments        Pertinent Vitals/Pain Pain Assessment: No/denies pain    Home Living                      Prior Function            PT Goals (current goals can now be found in the care plan section) Acute Rehab PT Goals Patient Stated Goal: to get stronger PT Goal Formulation: With patient Time For Goal Achievement: January 15, 2021 Potential  to Achieve Goals: Good Progress towards PT goals: Progressing toward goals    Frequency    Min 2X/week      PT Plan Current plan remains appropriate    Co-evaluation              AM-PAC PT "6 Clicks" Mobility   Outcome Measure  Help needed turning from your back to your side while in a flat bed without using bedrails?: A Little Help needed moving from lying on your back to sitting on the side of a flat bed without using bedrails?: A Little Help needed moving to and from a bed to a chair (including a wheelchair)?: A Lot Help needed standing up from a chair using your arms (e.g., wheelchair or bedside chair)?: A Lot Help needed to walk in hospital room?: A  Lot Help needed climbing 3-5 steps with a railing? : A Lot 6 Click Score: 14    End of Session Equipment Utilized During Treatment: Oxygen Activity Tolerance: Patient tolerated treatment well;Patient limited by fatigue Patient left: in chair;with call bell/phone within reach;with nursing/sitter in room Nurse Communication: Mobility status PT Visit Diagnosis: Muscle weakness (generalized) (M62.81);Difficulty in walking, not elsewhere classified (R26.2)     Time: 1962-2297 PT Time Calculation (min) (ACUTE ONLY): 23 min  Charges:  $Therapeutic Exercise: 8-22 mins $Therapeutic Activity: 8-22 mins                     Paul Parker, PT, MPT    Percell Locus 12/19/2020, 3:38 PM

## 2020-12-19 NOTE — Plan of Care (Signed)

## 2020-12-20 ENCOUNTER — Inpatient Hospital Stay: Payer: Medicare Other

## 2020-12-20 DIAGNOSIS — U071 COVID-19: Secondary | ICD-10-CM | POA: Diagnosis not present

## 2020-12-20 DIAGNOSIS — J9601 Acute respiratory failure with hypoxia: Secondary | ICD-10-CM | POA: Diagnosis not present

## 2020-12-20 DIAGNOSIS — E870 Hyperosmolality and hypernatremia: Secondary | ICD-10-CM | POA: Diagnosis not present

## 2020-12-20 DIAGNOSIS — J1282 Pneumonia due to coronavirus disease 2019: Secondary | ICD-10-CM | POA: Diagnosis not present

## 2020-12-20 LAB — CBC WITH DIFFERENTIAL/PLATELET
Abs Immature Granulocytes: 0.16 10*3/uL — ABNORMAL HIGH (ref 0.00–0.07)
Basophils Absolute: 0 10*3/uL (ref 0.0–0.1)
Basophils Relative: 0 %
Eosinophils Absolute: 0.2 10*3/uL (ref 0.0–0.5)
Eosinophils Relative: 1 %
HCT: 38.2 % — ABNORMAL LOW (ref 39.0–52.0)
Hemoglobin: 12.3 g/dL — ABNORMAL LOW (ref 13.0–17.0)
Immature Granulocytes: 1 %
Lymphocytes Relative: 6 %
Lymphs Abs: 0.9 10*3/uL (ref 0.7–4.0)
MCH: 31.7 pg (ref 26.0–34.0)
MCHC: 32.2 g/dL (ref 30.0–36.0)
MCV: 98.5 fL (ref 80.0–100.0)
Monocytes Absolute: 0.7 10*3/uL (ref 0.1–1.0)
Monocytes Relative: 5 %
Neutro Abs: 11.5 10*3/uL — ABNORMAL HIGH (ref 1.7–7.7)
Neutrophils Relative %: 87 %
Platelets: 227 10*3/uL (ref 150–400)
RBC: 3.88 MIL/uL — ABNORMAL LOW (ref 4.22–5.81)
RDW: 13.5 % (ref 11.5–15.5)
WBC: 13.4 10*3/uL — ABNORMAL HIGH (ref 4.0–10.5)
nRBC: 0 % (ref 0.0–0.2)

## 2020-12-20 LAB — GLUCOSE, CAPILLARY
Glucose-Capillary: 154 mg/dL — ABNORMAL HIGH (ref 70–99)
Glucose-Capillary: 259 mg/dL — ABNORMAL HIGH (ref 70–99)
Glucose-Capillary: 313 mg/dL — ABNORMAL HIGH (ref 70–99)
Glucose-Capillary: 277 mg/dL — ABNORMAL HIGH (ref 70–99)

## 2020-12-20 LAB — BASIC METABOLIC PANEL
Anion gap: 10 (ref 5–15)
BUN: 38 mg/dL — ABNORMAL HIGH (ref 8–23)
CO2: 32 mmol/L (ref 22–32)
Calcium: 9.1 mg/dL (ref 8.9–10.3)
Chloride: 109 mmol/L (ref 98–111)
Creatinine, Ser: 1.19 mg/dL (ref 0.61–1.24)
GFR, Estimated: >60 mL/min (ref >60)
Glucose, Bld: 93 mg/dL (ref 70–99)
Potassium: 4.3 mmol/L (ref 3.5–5.1)
Sodium: 151 mmol/L — ABNORMAL HIGH (ref 135–145)

## 2020-12-20 LAB — BRAIN NATRIURETIC PEPTIDE: B Natriuretic Peptide: 67.8 pg/mL (ref 0.0–100.0)

## 2020-12-20 LAB — PROCALCITONIN: Procalcitonin: 0.11 ng/mL

## 2020-12-20 MED ORDER — APIXABAN 5 MG PO TABS
5.0000 mg | ORAL_TABLET | Freq: Two times a day (BID) | ORAL | Status: DC
  Administered 2020-12-20 – 2020-12-27 (×15): 5 mg via ORAL
  Filled 2020-12-20 (×2): qty 1
  Filled 2020-12-20: qty 2
  Filled 2020-12-20 (×11): qty 1

## 2020-12-20 MED ORDER — DEXTROSE 5 % IV SOLN: INTRAVENOUS | Status: DC

## 2020-12-20 NOTE — Progress Notes (Signed)
PROGRESS NOTE    Paul Parker  EXB:[id number] DOB: [birth year] DOA: Dec 08, 2020 PCP: Jon Billings, NP   Chief complaint shortness of breath Brief Narrative:  Paul Parker is a 80 y.o. male medical history significant forhypertension, hyperlipidemia, coronary artery disease, history of prostate cancer, hypothyroid, depression/anxiety, history of atrial fibrillation converted to sinus rhythm on amiodarone in2013/2014, presented to the emergency department for chief concerns of worsening shortness of breath. He was diagnosed with COVID-19 on 11/27/2020.  He has received first and second dose of COVID-vaccine but has not received a booster yet.   He was admitted to the hospital for NUUVO-53 pneumonia complicated by acute hypoxic respiratory failure.   Assessment & Plan:   Active Problems:   Hypertension associated with diabetes ([place])   Acute hypoxemic respiratory failure due to COVID-19 [facility])   Prolonged QT interval   Hypernatremia   Atrial fibrillation (HCC)   Severe protein-calorie malnutrition (HCC)   Malnutrition of moderate degree   Pneumonia due to COVID-19 virus  #1. acute hypoxemic respiratory failure secondary to COVID-19 pneumonia COVID-19 pneumonia. Reviewed CT scan performed on 1/13, personally reviewed CT images.  Patient had extensive bilateral pulmonary infiltrates.  No PE. Patient still has severe hypoxemia, currently on 95% high flow oxygen. BNP normal, no volume overload. Procalcitonin level not elevated, no secondary bacterial pneumonia. Patient still on baricitinib, I will recheck a CRP, may consider restart steroids if CRP still elevated. Recheck D-dimer, will consider for anticoagulation.  Patient condition appears to be caused by severe bilateral viral pneumonia.  He is very slow in recovery.  Condition still guarded, still has risk for deterioration.  #2.  Severe hypernatremia. No volume overload, patient appears to be volume depleted.  Patient has a poor  appetite.  Will start D5 water.  #3.  Paroxysmal atrial fibrillation. Continue amiodarone. Will start Eliquis given patient may also has hypercoagulable status due to Covid.  4.  Uncontrolled type 2 diabetes with hyperglycemia. Continue current regimen.  5.  Elevated troponin.  Secondary to demand ischemia from Covid.  Follow.     DVT prophylaxis: Eliquis Code Status: Partial Family Communication: son updated Disposition Plan:  .   Status is: Inpatient  Remains inpatient appropriate because:Inpatient level of care appropriate due to severity of illness   Dispo: The patient is from: Home              Anticipated d/c is to: SNF              Anticipated d/c date is: > 3 days              Patient currently is not medically stable to d/c.   Difficult to place patient No        I/O last 3 completed shifts: In: 360 [P.O.:360] Out: 200 [Urine:200] No intake/output data recorded.     Consultants:   none  Procedures: none  Antimicrobials: none  Subjective: Patient still has significant short of breath with exertion, still requiring high flow oxygen. No cough. Patient has a poor appetite, but no nausea vomiting, no diarrhea. No fever chills. No dysuria hematuria. No chest pain or palpitation.  Objective: Vitals:   12/19/20 2035 12/20/20 0402 12/20/20 0813 12/20/20 0824  BP: 119/77 131/82  96/71  Pulse: 72 71  64  Resp: 18 17  16   Temp: 98.6 F (37 C) 98.7 F (37.1 C)  98 F (36.7 C)  TempSrc:      SpO2: 100% 99% 99% 98%  Weight:  70.3  kg    Height:        Intake/Output Summary (Last 24 hours) at 12/20/2020 1205 Last data filed at 12/20/2020 0600 Gross per 24 hour  Intake 240 ml  Output 200 ml  Net 40 ml   Filed Weights   12/18/20 0330 12/19/20 0332 12/20/20 0402  Weight: 69.2 kg 69.4 kg 70.3 kg    Examination:  General exam: Appears calm and comfortable  Respiratory system: Clear to auscultation. Respiratory effort normal. Cardiovascular  system: Regular. No JVD, murmurs, rubs, gallops or clicks. No pedal edema. Gastrointestinal system: Abdomen is nondistended, soft and nontender. No organomegaly or masses felt. Normal bowel sounds heard. Central nervous system: Alert and oriented x2. No focal neurological deficits. Extremities: Symmetric 5 x 5 power. Skin: MM dry Psychiatry: Mood & affect appropriate.     Data Reviewed: I have personally reviewed following labs and imaging studies  CBC: Recent Labs  Lab 12/18/20 0318 12/20/20 0641  WBC 13.8* 13.4*  NEUTROABS  --  11.5*  HGB 12.4* 12.3*  HCT 37.9* 38.2*  MCV 95.2 98.5  PLT 200 992   Basic Metabolic Panel: Recent Labs  Lab 12/14/20 0649 12/16/20 0605 12/20/20 0641  NA 147* 147* 151*  K 4.3 4.3 4.3  CL 110 108 109  CO2 26 29 32  GLUCOSE 157* 93 93  BUN 43* 45* 38*  CREATININE 0.92 0.94 1.19  CALCIUM 8.6* 8.9 9.1  MG 2.2  --   --   PHOS 3.6  --   --    GFR: Estimated Creatinine Clearance: 50.1 mL/min (by C-G formula based on SCr of 1.19 mg/dL). Liver Function Tests: No results for input(s): AST, ALT, ALKPHOS, BILITOT, PROT, ALBUMIN in the last 168 hours. No results for input(s): LIPASE, AMYLASE in the last 168 hours. No results for input(s): AMMONIA in the last 168 hours. Coagulation Profile: No results for input(s): INR, PROTIME in the last 168 hours. Cardiac Enzymes: No results for input(s): CKTOTAL, CKMB, CKMBINDEX, TROPONINI in the last 168 hours. BNP (last 3 results) No results for input(s): PROBNP in the last 8760 hours. HbA1C: No results for input(s): HGBA1C in the last 72 hours. CBG: Recent Labs  Lab 12/19/20 0800 12/19/20 1139 12/19/20 1601 12/19/20 2037 12/20/20 0825  GLUCAP 137* 276* 287* 255* 154*   Lipid Profile: No results for input(s): CHOL, HDL, LDLCALC, TRIG, CHOLHDL, LDLDIRECT in the last 72 hours. Thyroid Function Tests: No results for input(s): TSH, T4TOTAL, FREET4, T3FREE, THYROIDAB in the last 72 hours. Anemia  Panel: No results for input(s): VITAMINB12, FOLATE, FERRITIN, TIBC, IRON, RETICCTPCT in the last 72 hours. Sepsis Labs: Recent Labs  Lab 12/20/20 0641  PROCALCITON 0.11    No results found for this or any previous visit (from the past 240 hour(s)).       Radiology Studies: No results found.      Scheduled Meds: . amiodarone  100 mg Oral Daily  . aspirin EC  81 mg Oral Daily  . atorvastatin  40 mg Oral Daily  . baricitinib  4 mg Oral Daily  . chlorpheniramine-HYDROcodone  5 mL Oral Q12H  . clopidogrel  75 mg Oral Daily  . DULoxetine  30 mg Oral Daily  . enoxaparin (LOVENOX) injection  40 mg Subcutaneous Q24H  . feeding supplement (NEPRO CARB STEADY)  237 mL Oral TID BM  . ferrous sulfate  325 mg Oral Q breakfast  . insulin aspart  0-15 Units Subcutaneous TID WC  . insulin aspart  0-5 Units Subcutaneous  QHS  . insulin glargine  10 Units Subcutaneous Daily  . levothyroxine  75 mcg Oral Q0600  . multivitamin with minerals  1 tablet Oral Daily  . senna-docusate  1 tablet Oral QHS   Continuous Infusions: . dextrose       LOS: 13 days    Time spent: 37 minutes    Sharen Hones, MD [facility]   To contact the attending provider between 7A-7P or the covering provider during after hours 7P-7A, please log into the web site [email or web address] and access using universal [place] password for that web site. If you do not have the password, please call the hospital operator.  12/20/2020, 12:05 PM

## 2020-12-21 ENCOUNTER — Encounter: Payer: Medicare Other | Admitting: Nurse Practitioner

## 2020-12-21 DIAGNOSIS — Z515 Encounter for palliative care: Secondary | ICD-10-CM | POA: Diagnosis not present

## 2020-12-21 DIAGNOSIS — U071 COVID-19: Secondary | ICD-10-CM | POA: Diagnosis not present

## 2020-12-21 DIAGNOSIS — J1282 Pneumonia due to coronavirus disease 2019: Secondary | ICD-10-CM | POA: Diagnosis not present

## 2020-12-21 DIAGNOSIS — J9601 Acute respiratory failure with hypoxia: Secondary | ICD-10-CM | POA: Diagnosis not present

## 2020-12-21 LAB — CBC WITH DIFFERENTIAL/PLATELET
Abs Immature Granulocytes: 0.13 10*3/uL — ABNORMAL HIGH (ref 0.00–0.07)
Basophils Absolute: 0 10*3/uL (ref 0.0–0.1)
Basophils Relative: 0 %
Eosinophils Absolute: 0.1 10*3/uL (ref 0.0–0.5)
Eosinophils Relative: 1 %
HCT: 38.6 % — ABNORMAL LOW (ref 39.0–52.0)
Hemoglobin: 12.2 g/dL — ABNORMAL LOW (ref 13.0–17.0)
Immature Granulocytes: 1 %
Lymphocytes Relative: 6 %
Lymphs Abs: 0.6 10*3/uL — ABNORMAL LOW (ref 0.7–4.0)
MCH: 31.4 pg (ref 26.0–34.0)
MCHC: 31.6 g/dL (ref 30.0–36.0)
MCV: 99.2 fL (ref 80.0–100.0)
Monocytes Absolute: 0.6 10*3/uL (ref 0.1–1.0)
Monocytes Relative: 6 %
Neutro Abs: 9.8 10*3/uL — ABNORMAL HIGH (ref 1.7–7.7)
Neutrophils Relative %: 86 %
Platelets: 212 10*3/uL (ref 150–400)
RBC: 3.89 MIL/uL — ABNORMAL LOW (ref 4.22–5.81)
RDW: 13.6 % (ref 11.5–15.5)
WBC: 11.3 10*3/uL — ABNORMAL HIGH (ref 4.0–10.5)
nRBC: 0 % (ref 0.0–0.2)

## 2020-12-21 LAB — BASIC METABOLIC PANEL
Anion gap: 10 (ref 5–15)
BUN: 36 mg/dL — ABNORMAL HIGH (ref 8–23)
CO2: 30 mmol/L (ref 22–32)
Calcium: 8.6 mg/dL — ABNORMAL LOW (ref 8.9–10.3)
Chloride: 106 mmol/L (ref 98–111)
Creatinine, Ser: 1.06 mg/dL (ref 0.61–1.24)
GFR, Estimated: >60 mL/min (ref >60)
Glucose, Bld: 185 mg/dL — ABNORMAL HIGH (ref 70–99)
Potassium: 4.2 mmol/L (ref 3.5–5.1)
Sodium: 146 mmol/L — ABNORMAL HIGH (ref 135–145)

## 2020-12-21 LAB — GLUCOSE, CAPILLARY
Glucose-Capillary: 133 mg/dL — ABNORMAL HIGH (ref 70–99)
Glucose-Capillary: 169 mg/dL — ABNORMAL HIGH (ref 70–99)
Glucose-Capillary: 199 mg/dL — ABNORMAL HIGH (ref 70–99)
Glucose-Capillary: 190 mg/dL — ABNORMAL HIGH (ref 70–99)
Glucose-Capillary: 196 mg/dL — ABNORMAL HIGH (ref 70–99)

## 2020-12-21 LAB — FIBRIN DERIVATIVES D-DIMER (ARMC ONLY): Fibrin derivatives D-dimer (ARMC): 3576.25 ng/mL (FEU) — ABNORMAL HIGH (ref 0.00–499.00)

## 2020-12-21 LAB — C-REACTIVE PROTEIN: CRP: 5.8 mg/dL — ABNORMAL HIGH (ref <1.0)

## 2020-12-21 LAB — MAGNESIUM: Magnesium: 2.1 mg/dL (ref 1.7–2.4)

## 2020-12-21 NOTE — Progress Notes (Signed)
Physical Therapy Treatment Patient Details Name: Paul Parker MRN: [id number] DOB: [birth year] Today's Date: 12/21/2020    History of Present Illness presented to ER secondary to progressive SOB; admitted for management of acute hypoxic/hypoxemic respiratory failure due to COVID-19    PT Comments    Patient is alert but lethargic during session and with slow processing with following commands. Patient required assistance for bed mobility. Poor sitting balance with left lean. Facilitation for midline and cues for increased postural awareness. Sp02 decreased to 85% briefly after sitting upright and increased to low 90's quickly with cues for breathing techniques. Patient fatigued with activity and transfers not attempted this session. Patient repositioned in bed with head/neck in midline position with use of towel roll to promote neutral positioning as patient with tendency to turn head/neck to left. Recommend to continue PT to maximize independence and address functional limitations remaining. SNF is an appropriate discharge plan at this time.    Follow Up Recommendations  SNF     Equipment Recommendations  Other (comment) (to  be determinted at next level of care)    Recommendations for Other Services       Precautions / Restrictions Precautions Precautions: Fall Restrictions Weight Bearing Restrictions: No    Mobility  Bed Mobility Overal bed mobility: Needs Assistance Bed Mobility: Supine to Sit;Sit to Supine     Supine to sit: Min assist Sit to supine: Mod assist   General bed mobility comments: verbal cues for sequencing and technique. increased time required to complete tasks. increased assistance required for BLE support for return to bed with activity tolerance limited by fatigue  Transfers                 General transfer comment: not attempted due to lethargy and poor sitting balance  Ambulation/Gait                 Stairs              Wheelchair Mobility    Modified Rankin (Stroke Patients Only)       Balance Overall balance assessment: Needs assistance Sitting-balance support: No upper extremity supported;Feet supported Sitting balance-Leahy Scale: Poor Sitting balance - Comments: patient flexed neck and trunk,  left lateral lean in sitting position. verbal and tactile cues and faciliation for midline balance and upright sitting posture Postural control: Left lateral lean                                  Cognition Arousal/Alertness: Lethargic Behavior During Therapy: Flat affect Overall Cognitive Status: Impaired/Different from baseline                                 General Comments: slow processing with following single step commands.      Exercises General Exercises - Lower Extremity Ankle Circles/Pumps: AROM;Strengthening;Both;10 reps;Supine Heel Slides: AAROM;Strengthening;Both;10 reps;Supine Hip ABduction/ADduction: AROM;Strengthening;Both;10 reps;Supine Straight Leg Raises: AROM;Strengthening;Both;10 reps;Supine Other Exercises Other Exercises: verbal and tactile cues for exercise technique    General Comments        Pertinent Vitals/Pain Pain Assessment: No/denies pain    Home Living                      Prior Function            PT Goals (current goals can now be found in the  care plan section) Acute Rehab PT Goals Patient Stated Goal: to get stronger PT Goal Formulation: With patient Time For Goal Achievement: 2021-01-15 Potential to Achieve Goals: Good Progress towards PT goals: Progressing toward goals    Frequency    Min 2X/week      PT Plan Current plan remains appropriate    Co-evaluation              AM-PAC PT "6 Clicks" Mobility   Outcome Measure  Help needed turning from your back to your side while in a flat bed without using bedrails?: A Little Help needed moving from lying on your back to sitting on the side  of a flat bed without using bedrails?: A Little Help needed moving to and from a bed to a chair (including a wheelchair)?: A Lot Help needed standing up from a chair using your arms (e.g., wheelchair or bedside chair)?: A Lot Help needed to walk in hospital room?: A Lot Help needed climbing 3-5 steps with a railing? : A Lot 6 Click Score: 14    End of Session Equipment Utilized During Treatment: Oxygen (50L via heated HFNC) Activity Tolerance: Patient limited by fatigue Patient left: in bed;with call bell/phone within reach;with bed alarm set   PT Visit Diagnosis: Muscle weakness (generalized) (M62.81);Difficulty in walking, not elsewhere classified (R26.2)     Time: 9678-9381 PT Time Calculation (min) (ACUTE ONLY): 24 min  Charges:  $Therapeutic Exercise: 8-22 mins $Therapeutic Activity: 8-22 mins                     [name], PT, MPT    Percell Locus 12/21/2020, 2:53 PM

## 2020-12-21 NOTE — Progress Notes (Addendum)
Daily Progress Note   Patient Name: Paul Parker       Date: 12/21/2020 DOB: 1941-02-25  Age: 80 y.o. MRN#: [id number] Attending Physician: Sharen Hones, MD Primary Care Physician: Jon Billings, NP Admit Date: 11/26/2020  Reason for Consultation/Follow-up: Establishing goals of care  Subjective: Patient is on covid precautions. Spoke with [employer] Updated on status and discussed poor prognosis. All questions answered. She discussed where the different family members are with moving forward, and that son who lives with them wants to continue all care possible, and also patient's wife.   Length of Stay: 14  Current Medications: Scheduled Meds:  . amiodarone  100 mg Oral Daily  . apixaban  5 mg Oral BID  . atorvastatin  40 mg Oral Daily  . baricitinib  4 mg Oral Daily  . chlorpheniramine-HYDROcodone  5 mL Oral Q12H  . clopidogrel  75 mg Oral Daily  . DULoxetine  30 mg Oral Daily  . feeding supplement (NEPRO CARB STEADY)  237 mL Oral TID BM  . ferrous sulfate  325 mg Oral Q breakfast  . insulin aspart  0-15 Units Subcutaneous TID WC  . insulin aspart  0-5 Units Subcutaneous QHS  . insulin glargine  10 Units Subcutaneous Daily  . levothyroxine  75 mcg Oral Q0600  . multivitamin with minerals  1 tablet Oral Daily  . senna-docusate  1 tablet Oral QHS    Continuous Infusions: . dextrose 50 mL/hr at 12/21/20 1214    PRN Meds: acetaminophen, guaiFENesin-dextromethorphan, ondansetron **OR** ondansetron (ZOFRAN) IV, polyethylene glycol     Vital Signs: BP (!) 120/57 (BP Location: Left Arm)   Pulse 66   Temp 98.8 F (37.1 C) (Oral)   Resp 18   Ht 5\' 11"  (1.803 m)   Wt 70.1 kg   SpO2 100%   BMI 21.55 kg/m  SpO2: SpO2: 100 % O2 Device: O2 Device: High Flow Nasal Cannula O2 Flow  Rate: O2 Flow Rate (L/min): 50 L/min  Intake/output summary:   Intake/Output Summary (Last 24 hours) at 12/21/2020 1531 Last data filed at 12/21/2020 1259 Gross per 24 hour  Intake 1249.38 ml  Output 1250 ml  Net -0.62 ml   LBM: Last BM Date: 12/19/20 Baseline Weight: Weight: 79.4 kg Most recent weight: Weight: 70.1 kg        Flowsheet  Rows   Flowsheet Row Most Recent Value  Intake Tab   Referral Department Hospitalist  Unit at Time of Referral ER  Palliative Care Primary Diagnosis Sepsis/Infectious Disease  Date Notified 12/11/20  Palliative Care Type New Palliative care  Reason for referral Clarify Goals of Care  Date of Admission 12/14/2020  Date first seen by Palliative Care 12/11/20  # of days Palliative referral response time 0 Day(s)  # of days IP prior to Palliative referral 4  Clinical Assessment   Psychosocial & Spiritual Assessment   Palliative Care Outcomes       Patient Active Problem List   Diagnosis Date Noted  . Pneumonia due to COVID-19 virus 12/16/2020  . Aortic atherosclerosis ([place]) 12/15/2020  . History of concussion 12/15/2020  . Hyperlipidemia associated with type 2 diabetes mellitus ([place]) 12/15/2020  . Malnutrition of moderate degree 12/13/2020  . Hypernatremia   . Atrial fibrillation ([place])   . Severe protein-calorie malnutrition ([place])   . Swelling of lower leg   . Acute hypoxemic respiratory failure due to COVID-19 ([place]) 12/16/2020  . Prolonged QT interval 12/04/2020  . SSS (sick sinus syndrome) ([place]) 04/05/2020  . 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 Memory changes 12/02/2019  . Recurrent major depressive disorder, in full remission ([place]) 09/30/2018  . Anemia 09/30/2018  . Advanced care planning/counseling discussion 10/28/2017  . Senile purpura ([place]) 08/30/2015  . CAD (coronary artery disease) 08/30/2015  . BPH (benign prostatic hyperplasia) 08/30/2015  . Hypothyroidism 08/30/2015  . Type 2 diabetes mellitus without obesity ([place])    . Hypertension associated with diabetes [facility])     Palliative Care Assessment & Plan    Recommendations/Plan:  Continue current care.    Code Status:    Code Status Orders  (From admission, onward)         Start     Ordered   12/15/20 1428  Limited resuscitation (code)  Continuous       Question Answer Comment  In the event of cardiac or respiratory ARREST: Initiate Code Blue, Call Rapid Response Yes   In the event of cardiac or respiratory ARREST: Perform CPR No   In the event of cardiac or respiratory ARREST: Perform Intubation/Mechanical Ventilation Yes   In the event of cardiac or respiratory ARREST: Use NIPPV/BiPAp only if indicated Yes   In the event of cardiac or respiratory ARREST: Administer ACLS medications if indicated Yes   In the event of cardiac or respiratory ARREST: Perform Defibrillation or Cardioversion if indicated Yes   Comments Intubate for respiratory distress/failure. In [place]      12/15/20 1427        Code Status History    Date Active Date Inactive Code Status Order ID Comments User Context   12/12/2020 1544 12/15/2020 1427 Partial Code [id number]  Asencion Gowda, NP Inpatient   12/25/2020 3664 12/12/2020 1544 Full Code [id number]  Cox[facility]ant Cedar, DO ED   Advance Care Planning Activity       Prognosis:   < 6 months   Thank you for allowing the Palliative Medicine Team to assist in the care of this patient.   Total Time 25 min Prolonged Time Billed  no      Greater than 50%  of this time was spent counseling and coordinating care related to the above assessment and plan.  Asencion Gowda, NP  Please contact Palliative Medicine Team phone at [phone] for questions and concerns.

## 2020-12-21 NOTE — Progress Notes (Signed)
Inpatient Diabetes Program Recommendations  AACE/ADA: New Consensus Statement on Inpatient Glycemic Control   Target Ranges:  Prepandial:   less than 140 mg/dL      Peak postprandial:   less than 180 mg/dL (1-2 hours)      Critically ill patients:  140 - 180 mg/dL   Results for [facility] (MRN [id number]) as of 12/21/2020 10:42  Ref. Range 12/20/2020 08:25 12/20/2020 12:19 12/20/2020 16:29 12/20/2020 20:22 12/21/2020 07:53 12/21/2020 08:05  Glucose-Capillary Latest Ref Range: 70 - 99 mg/dL 154 (H) 259 (H) 313 (H) 277 (H) 133 (H) 169 (H)   Review of Glycemic Control  Diabetes history: DM2 Outpatient Diabetes medications:Metformin 500 mg BID, Actos 45 mg daily Current orders for Inpatient glycemic control: Lantus 10 units daily, Novolog 0-15 units TID with meals, Novolog 0-5 units QHS  Inpatient Diabetes Program Recommendations:    Insulin: Please consider ordering Novolog 3 units TID with meals for meal coverage if patient eats at least 50% of meals.  Thanks, Barnie Alderman, RN, MSN, CDE Diabetes Coordinator Inpatient Diabetes Program [phone] (Team Pager from 8am to 5pm)

## 2020-12-21 NOTE — Progress Notes (Signed)
PROGRESS NOTE    Paul Parker  [id number] DOB: 1941-01-02 DOA: 12/02/2020 PCP: Jon Billings, NP   Chief complaint.  Shortness of breath. Brief Narrative:  Paul Richis a 80 y.o.malemedical history significant forhypertension, hyperlipidemia, coronary artery disease, history of prostate cancer, hypothyroid, depression/anxiety, history of atrial fibrillation converted to sinus rhythm on amiodarone in2013/2014, presented to the emergency department for chief concerns of worsening shortness of breath. He was diagnosed with COVID-19 on 11/27/2020. He has received first and second dose of COVID-vaccine but has not received a booster yet. He was admitted to the hospital for [id number] pneumonia complicated by acute hypoxic respiratory failure.   Assessment & Plan:   Active Problems:   Hypertension associated with diabetes ([place])   Acute hypoxemic respiratory failure due to COVID-19 [facility])   Prolonged QT interval   Hypernatremia   Atrial fibrillation (HCC)   Severe protein-calorie malnutrition (HCC)   Malnutrition of moderate degree   Pneumonia due to COVID-19 virus  #1.  Acute hypoxemic respiratory failure secondary to COVID-19 pneumonia. Multilobar pneumonia secondary to COVID-19 infection. Patient does not have volume overload, no PE, no secondary bacterial pneumonia. Patient has a severe bilateral pulmonary infiltrates due to Covid. He still has a moderate elevation of CRP, will complete the course of baricitinib. I will consider restart steroids once personally is completed if condition does not improve. Patient still on high flow oxygen, but is slightly better on 80%.  Continue to follow.  2.  Severe hyponatremia. Continue D5 water, sodium level better, decrease fluids to 50 mL/h.  3.  Paroxysmal atrial fibrillation. Continue amiodarone and Eliquis.  4.  Uncontrolled type 2 diabetes with hyperglycemia. Continue current regimen.  5.  Elevated troponin secondary to demand  ischemia from Covid. Follow.     DVT prophylaxis: Eliquis Code Status: Partial Family Communication: called, nit able to reach son Disposition Plan:  .   Status is: Inpatient  Remains inpatient appropriate because:Inpatient level of care appropriate due to severity of illness   Dispo: The patient is from: Home              Anticipated d/c is to: SNF              Anticipated d/c date is: > 3 days              Patient currently is not medically stable to d/c.   Difficult to place patient No        I/O last 3 completed shifts: In: 1489.4 [P.O.:480; I.V.:1009.4] Out: 1400 [Urine:1400] No intake/output data recorded.     Consultants:   None  Procedures: None  Antimicrobials: None  Subjective: Patient still has significant hypoxia, 80% oxygen.  Some short of breath with exertion.  Confused, poor appetite. Does not have any abdominal pain nausea vomiting. No fever chills. No chest pain or palpitation.    Objective: Vitals:   12/21/20 0500 12/21/20 0804 12/21/20 0844 12/21/20 1210  BP:  113/67  (!) 120/57  Pulse:  67  66  Resp:  18  18  Temp:  97.8 F (36.6 C)  98.8 F (37.1 C)  TempSrc:  Oral  Oral  SpO2:  95% 100% 99%  Weight: 70.1 kg     Height:        Intake/Output Summary (Last 24 hours) at 12/21/2020 1221 Last data filed at 12/21/2020 0451 Gross per 24 hour  Intake 1249.38 ml  Output 900 ml  Net 349.38 ml   Filed Weights   12/19/20 0332  12/20/20 0402 12/21/20 0500  Weight: 69.4 kg 70.3 kg 70.1 kg    Examination:  General exam: Appears calm and comfortable  Respiratory system: Clear to auscultation. Respiratory effort normal. Cardiovascular system: S1 & S2 heard, RRR. No JVD, murmurs, rubs, gallops or clicks. No pedal edema. Gastrointestinal system: Abdomen is nondistended, soft and nontender. No organomegaly or masses felt. Normal bowel sounds heard. Central nervous system: Alert and oriented x2. No focal neurological  deficits. Extremities: Symmetric 5 x 5 power. Skin: No rashes, lesions or ulcers      Data Reviewed: I have personally reviewed following labs and imaging studies  CBC: Recent Labs  Lab 12/18/20 0318 12/20/20 0641 12/21/20 0644  WBC 13.8* 13.4* 11.3*  NEUTROABS  --  11.5* 9.8*  HGB 12.4* 12.3* 12.2*  HCT 37.9* 38.2* 38.6*  MCV 95.2 98.5 99.2  PLT 200 227 431   Basic Metabolic Panel: Recent Labs  Lab 12/16/20 0605 12/20/20 0641 12/21/20 0644  NA 147* 151* 146*  K 4.3 4.3 4.2  CL 108 109 106  CO2 29 32 30  GLUCOSE 93 93 185*  BUN 45* 38* 36*  CREATININE 0.94 1.19 1.06  CALCIUM 8.9 9.1 8.6*  MG  --   --  2.1   GFR: Estimated Creatinine Clearance: 56 mL/min (by C-G formula based on SCr of 1.06 mg/dL). Liver Function Tests: No results for input(s): AST, ALT, ALKPHOS, BILITOT, PROT, ALBUMIN in the last 168 hours. No results for input(s): LIPASE, AMYLASE in the last 168 hours. No results for input(s): AMMONIA in the last 168 hours. Coagulation Profile: No results for input(s): INR, PROTIME in the last 168 hours. Cardiac Enzymes: No results for input(s): CKTOTAL, CKMB, CKMBINDEX, TROPONINI in the last 168 hours. BNP (last 3 results) No results for input(s): PROBNP in the last 8760 hours. HbA1C: No results for input(s): HGBA1C in the last 72 hours. CBG: Recent Labs  Lab 12/20/20 1629 12/20/20 2022 12/21/20 0753 12/21/20 0805 12/21/20 1207  GLUCAP 313* 277* 133* 169* 199*   Lipid Profile: No results for input(s): CHOL, HDL, LDLCALC, TRIG, CHOLHDL, LDLDIRECT in the last 72 hours. Thyroid Function Tests: No results for input(s): TSH, T4TOTAL, FREET4, T3FREE, THYROIDAB in the last 72 hours. Anemia Panel: No results for input(s): VITAMINB12, FOLATE, FERRITIN, TIBC, IRON, RETICCTPCT in the last 72 hours. Sepsis Labs: Recent Labs  Lab 12/20/20 0641  PROCALCITON 0.11    No results found for this or any previous visit (from the past 240 hour(s)).        Radiology Studies: DG Chest 1 View  Result Date: 12/20/2020 CLINICAL DATA:  Acute respiratory failure.  COVID. EXAM: CHEST  1 VIEW COMPARISON:  CT 12/17/2020.  Chest x-ray 12/04/2020. FINDINGS: Cardiac pacer with lead tips over the right atrium right ventricle. Prior CABG. Stable cardiomegaly. Postsurgical changes left lung. Diffuse severe bilateral pulmonary infiltrates/edema again noted. IMPRESSION: 1. Cardiac pacer with lead tips over the right atrium right ventricle. Prior CABG. Stable cardiomegaly. 2. Diffuse severe bilateral pulmonary infiltrates/edema again noted. Findings again consistent with COVID pneumonia. No interim change. Electronically Signed   By: Marcello Moores  Register   On: 12/20/2020 12:46        Scheduled Meds: . amiodarone  100 mg Oral Daily  . apixaban  5 mg Oral BID  . atorvastatin  40 mg Oral Daily  . baricitinib  4 mg Oral Daily  . chlorpheniramine-HYDROcodone  5 mL Oral Q12H  . clopidogrel  75 mg Oral Daily  . DULoxetine  30 mg  Oral Daily  . feeding supplement (NEPRO CARB STEADY)  237 mL Oral TID BM  . ferrous sulfate  325 mg Oral Q breakfast  . insulin aspart  0-15 Units Subcutaneous TID WC  . insulin aspart  0-5 Units Subcutaneous QHS  . insulin glargine  10 Units Subcutaneous Daily  . levothyroxine  75 mcg Oral Q0600  . multivitamin with minerals  1 tablet Oral Daily  . senna-docusate  1 tablet Oral QHS   Continuous Infusions: . dextrose 50 mL/hr at 12/21/20 1214     LOS: 14 days    Time spent: 28 minutes    Sharen Hones, MD [facility]   To contact the attending provider between 7A-7P or the covering provider during after hours 7P-7A, please log into the web site [email or web address] and access using universal [place] password for that web site. If you do not have the password, please call the hospital operator.  12/21/2020, 12:21 PM
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
## 2020-12-22 DIAGNOSIS — I480 Paroxysmal atrial fibrillation: Secondary | ICD-10-CM | POA: Diagnosis not present

## 2020-12-22 DIAGNOSIS — E870 Hyperosmolality and hypernatremia: Secondary | ICD-10-CM | POA: Diagnosis not present

## 2020-12-22 DIAGNOSIS — U071 COVID-19: Secondary | ICD-10-CM | POA: Diagnosis not present

## 2020-12-22 DIAGNOSIS — J9601 Acute respiratory failure with hypoxia: Secondary | ICD-10-CM | POA: Diagnosis not present

## 2020-12-22 LAB — CBC WITH DIFFERENTIAL/PLATELET
Abs Immature Granulocytes: 0.13 10*3/uL — ABNORMAL HIGH (ref 0.00–0.07)
Basophils Absolute: 0 10*3/uL (ref 0.0–0.1)
Basophils Relative: 0 %
Eosinophils Absolute: 0.1 10*3/uL (ref 0.0–0.5)
Eosinophils Relative: 1 %
HCT: 33.7 % — ABNORMAL LOW (ref 39.0–52.0)
Hemoglobin: 10.5 g/dL — ABNORMAL LOW (ref 13.0–17.0)
Immature Granulocytes: 1 %
Lymphocytes Relative: 6 %
Lymphs Abs: 0.7 10*3/uL (ref 0.7–4.0)
MCH: 30.9 pg (ref 26.0–34.0)
MCHC: 31.2 g/dL (ref 30.0–36.0)
MCV: 99.1 fL (ref 80.0–100.0)
Monocytes Absolute: 0.7 10*3/uL (ref 0.1–1.0)
Monocytes Relative: 6 %
Neutro Abs: 10.5 10*3/uL — ABNORMAL HIGH (ref 1.7–7.7)
Neutrophils Relative %: 86 %
Platelets: 182 10*3/uL (ref 150–400)
RBC: 3.4 MIL/uL — ABNORMAL LOW (ref 4.22–5.81)
RDW: 13.4 % (ref 11.5–15.5)
WBC: 12.2 10*3/uL — ABNORMAL HIGH (ref 4.0–10.5)
nRBC: 0 % (ref 0.0–0.2)

## 2020-12-22 LAB — BASIC METABOLIC PANEL
Anion gap: 9 (ref 5–15)
BUN: 28 mg/dL — ABNORMAL HIGH (ref 8–23)
CO2: 30 mmol/L (ref 22–32)
Calcium: 8.3 mg/dL — ABNORMAL LOW (ref 8.9–10.3)
Chloride: 105 mmol/L (ref 98–111)
Creatinine, Ser: 1.03 mg/dL (ref 0.61–1.24)
GFR, Estimated: >60 mL/min (ref >60)
Glucose, Bld: 130 mg/dL — ABNORMAL HIGH (ref 70–99)
Potassium: 4.2 mmol/L (ref 3.5–5.1)
Sodium: 144 mmol/L (ref 135–145)

## 2020-12-22 LAB — GLUCOSE, CAPILLARY
Glucose-Capillary: 128 mg/dL — ABNORMAL HIGH (ref 70–99)
Glucose-Capillary: 148 mg/dL — ABNORMAL HIGH (ref 70–99)
Glucose-Capillary: 121 mg/dL — ABNORMAL HIGH (ref 70–99)
Glucose-Capillary: 198 mg/dL — ABNORMAL HIGH (ref 70–99)

## 2020-12-22 LAB — MAGNESIUM: Magnesium: 2 mg/dL (ref 1.7–2.4)

## 2020-12-22 NOTE — Progress Notes (Signed)
PROGRESS NOTE    Paul Parker  [id number] DOB: 1941/06/03 DOA: 12/14/2020 PCP: Jon Billings, NP   Chief complaint of shortness of breath Brief Narrative:  Paul Parker a 80 y.o.malemedical history significant forhypertension, hyperlipidemia, coronary artery disease, history of prostate cancer, hypothyroid, depression/anxiety, history of atrial fibrillation converted to sinus rhythm on amiodarone in2013/2014, presented to the emergency department for chief concerns of worsening shortness of breath. He was diagnosed with COVID-19 on 11/27/2020. He has received first and second dose of COVID-vaccine but has not received a booster yet. He was admitted to the hospital for [id number] pneumonia complicated by acute hypoxic respiratory failure.   Assessment & Plan:   Active Problems:   Hypertension associated with diabetes ([place])   Acute hypoxemic respiratory failure due to COVID-19 [facility])   Prolonged QT interval   Hypernatremia   Atrial fibrillation (HCC)   Severe protein-calorie malnutrition (HCC)   Malnutrition of moderate degree   Pneumonia due to COVID-19 virus   #1.  Acute hypoxemic respiratory failure secondary to COVID-19 pneumonia. Multilobar pneumonia secondary to COVID-19 infection. Patient condition finally improving, currently on 55% oxygen.  Completed steroid course and baricitinib. Continue to follow.  2.  Severe hyponatremia. Sodium level finally improved.  Will discontinue D5 water.  3.  Uncontrolled type 2 diabetes with hyperglycemia Continue current treatment.  4.  Paroxysmal defibrillation. Elevated troponin secondary to demand ischemia from Covid. Continue Eliquis.    DVT prophylaxis: Eliquis Code Status: Partial Family Communication:  Disposition Plan:  .   Status is: Inpatient  Remains inpatient appropriate because:Inpatient level of care appropriate due to severity of illness   Dispo: The patient is from: Home              Anticipated d/c is  to: SNF              Anticipated d/c date is: > 3 days              Patient currently is not medically stable to d/c.   Difficult to place patient No        I/O last 3 completed shifts: In: 1009.4 [I.V.:1009.4] Out: 1550 [Urine:1550] No intake/output data recorded.     Consultants:   None  Procedures: None  Antimicrobials: None Subjective: Patient oxygenation finally improving, currently on 55% oxygen.  No significant short of breath.  His mental status seem to be improving, he is less confused today. Still has a poor appetite, no nausea vomiting abdominal pain. No fever or chills.  Objective: Vitals:   12/22/20 0449 12/22/20 0820 12/22/20 0856 12/22/20 1103  BP: 125/64  (!) 103/53 112/66  Pulse: 67  65 64  Resp: 18  17 17   Temp: 97.9 F (36.6 C)  (!) 97.5 F (36.4 C) 97.8 F (36.6 C)  TempSrc: Oral  Oral Oral  SpO2: 94% 96% 93% 96%  Weight:      Height:        Intake/Output Summary (Last 24 hours) at 12/22/2020 1246 Last data filed at 12/22/2020 0532 Gross per 24 hour  Intake --  Output 650 ml  Net -650 ml   Filed Weights   12/19/20 0332 12/20/20 0402 12/21/20 0500  Weight: 69.4 kg 70.3 kg 70.1 kg    Examination:  General exam: Appears calm and comfortable  Respiratory system: Clear to auscultation. Respiratory effort normal. Cardiovascular system: S1 & S2 heard, RRR. No JVD, murmurs, rubs, gallops or clicks. No pedal edema. Gastrointestinal system: Abdomen is nondistended, soft and nontender. No  organomegaly or masses felt. Normal bowel sounds heard. Central nervous system: Alert and oriented x2. No focal neurological deficits. Extremities: Symmetric 5 x 5 power. Skin: No rashes, lesions or ulcers Psychiatry: Mood & affect appropriate.     Data Reviewed: I have personally reviewed following labs and imaging studies  CBC: Recent Labs  Lab 12/18/20 0318 12/20/20 0641 12/21/20 0644 12/22/20 0657  WBC 13.8* 13.4* 11.3* 12.2*  NEUTROABS  --   11.5* 9.8* 10.5*  HGB 12.4* 12.3* 12.2* 10.5*  HCT 37.9* 38.2* 38.6* 33.7*  MCV 95.2 98.5 99.2 99.1  PLT 200 227 212 185   Basic Metabolic Panel: Recent Labs  Lab 12/16/20 0605 12/20/20 0641 12/21/20 0644 12/22/20 0657  NA 147* 151* 146* 144  K 4.3 4.3 4.2 4.2  CL 108 109 106 105  CO2 29 32 30 30  GLUCOSE 93 93 185* 130*  BUN 45* 38* 36* 28*  CREATININE 0.94 1.19 1.06 1.03  CALCIUM 8.9 9.1 8.6* 8.3*  MG  --   --  2.1 2.0   GFR: Estimated Creatinine Clearance: 57.7 mL/min (by C-G formula based on SCr of 1.03 mg/dL). Liver Function Tests: No results for input(s): AST, ALT, ALKPHOS, BILITOT, PROT, ALBUMIN in the last 168 hours. No results for input(s): LIPASE, AMYLASE in the last 168 hours. No results for input(s): AMMONIA in the last 168 hours. Coagulation Profile: No results for input(s): INR, PROTIME in the last 168 hours. Cardiac Enzymes: No results for input(s): CKTOTAL, CKMB, CKMBINDEX, TROPONINI in the last 168 hours. BNP (last 3 results) No results for input(s): PROBNP in the last 8760 hours. HbA1C: No results for input(s): HGBA1C in the last 72 hours. CBG: Recent Labs  Lab 12/21/20 1207 12/21/20 1604 12/21/20 2157 12/22/20 0859 12/22/20 1142  GLUCAP 199* 190* 196* 128* 148*   Lipid Profile: No results for input(s): CHOL, HDL, LDLCALC, TRIG, CHOLHDL, LDLDIRECT in the last 72 hours. Thyroid Function Tests: No results for input(s): TSH, T4TOTAL, FREET4, T3FREE, THYROIDAB in the last 72 hours. Anemia Panel: No results for input(s): VITAMINB12, FOLATE, FERRITIN, TIBC, IRON, RETICCTPCT in the last 72 hours. Sepsis Labs: Recent Labs  Lab 12/20/20 0641  PROCALCITON 0.11    No results found for this or any previous visit (from the past 240 hour(s)).       Radiology Studies: No results found.      Scheduled Meds: . amiodarone  100 mg Oral Daily  . apixaban  5 mg Oral BID  . atorvastatin  40 mg Oral Daily  . baricitinib  4 mg Oral Daily  .  chlorpheniramine-HYDROcodone  5 mL Oral Q12H  . clopidogrel  75 mg Oral Daily  . DULoxetine  30 mg Oral Daily  . feeding supplement (NEPRO CARB STEADY)  237 mL Oral TID BM  . ferrous sulfate  325 mg Oral Q breakfast  . insulin aspart  0-15 Units Subcutaneous TID WC  . insulin aspart  0-5 Units Subcutaneous QHS  . insulin glargine  10 Units Subcutaneous Daily  . levothyroxine  75 mcg Oral Q0600  . multivitamin with minerals  1 tablet Oral Daily  . senna-docusate  1 tablet Oral QHS   Continuous Infusions: . dextrose 50 mL/hr at 12/21/20 1214     LOS: 15 days    Time spent: 28 minutes    Sharen Hones, MD [facility]   To contact the attending provider between 7A-7P or the covering provider during after hours 7P-7A, please log into the web site [email or web address] and  access using universal [place] password for that web site. If you do not have the password, please call the hospital operator.  12/22/2020, 12:46 PM

## 2020-12-23 DIAGNOSIS — U071 COVID-19: Secondary | ICD-10-CM | POA: Diagnosis not present

## 2020-12-23 DIAGNOSIS — J1282 Pneumonia due to coronavirus disease 2019: Secondary | ICD-10-CM | POA: Diagnosis not present

## 2020-12-23 DIAGNOSIS — I480 Paroxysmal atrial fibrillation: Secondary | ICD-10-CM | POA: Diagnosis not present

## 2020-12-23 DIAGNOSIS — J9601 Acute respiratory failure with hypoxia: Secondary | ICD-10-CM | POA: Diagnosis not present

## 2020-12-23 LAB — BASIC METABOLIC PANEL
Anion gap: 9 (ref 5–15)
BUN: 28 mg/dL — ABNORMAL HIGH (ref 8–23)
CO2: 28 mmol/L (ref 22–32)
Calcium: 8.5 mg/dL — ABNORMAL LOW (ref 8.9–10.3)
Chloride: 108 mmol/L (ref 98–111)
Creatinine, Ser: 0.81 mg/dL (ref 0.61–1.24)
GFR, Estimated: >60 mL/min (ref >60)
Glucose, Bld: 95 mg/dL (ref 70–99)
Potassium: 4.2 mmol/L (ref 3.5–5.1)
Sodium: 145 mmol/L (ref 135–145)

## 2020-12-23 LAB — CBC WITH DIFFERENTIAL/PLATELET
Abs Immature Granulocytes: 0.08 10*3/uL — ABNORMAL HIGH (ref 0.00–0.07)
Basophils Absolute: 0 10*3/uL (ref 0.0–0.1)
Basophils Relative: 0 %
Eosinophils Absolute: 0.1 10*3/uL (ref 0.0–0.5)
Eosinophils Relative: 1 %
HCT: 34.4 % — ABNORMAL LOW (ref 39.0–52.0)
Hemoglobin: 10.9 g/dL — ABNORMAL LOW (ref 13.0–17.0)
Immature Granulocytes: 1 %
Lymphocytes Relative: 8 %
Lymphs Abs: 0.7 10*3/uL (ref 0.7–4.0)
MCH: 31.1 pg (ref 26.0–34.0)
MCHC: 31.7 g/dL (ref 30.0–36.0)
MCV: 98 fL (ref 80.0–100.0)
Monocytes Absolute: 0.5 10*3/uL (ref 0.1–1.0)
Monocytes Relative: 6 %
Neutro Abs: 7.4 10*3/uL (ref 1.7–7.7)
Neutrophils Relative %: 84 %
Platelets: 187 10*3/uL (ref 150–400)
RBC: 3.51 MIL/uL — ABNORMAL LOW (ref 4.22–5.81)
RDW: 13.4 % (ref 11.5–15.5)
WBC: 8.8 10*3/uL (ref 4.0–10.5)
nRBC: 0 % (ref 0.0–0.2)

## 2020-12-23 LAB — GLUCOSE, CAPILLARY
Glucose-Capillary: 115 mg/dL — ABNORMAL HIGH (ref 70–99)
Glucose-Capillary: 167 mg/dL — ABNORMAL HIGH (ref 70–99)
Glucose-Capillary: 224 mg/dL — ABNORMAL HIGH (ref 70–99)
Glucose-Capillary: 165 mg/dL — ABNORMAL HIGH (ref 70–99)

## 2020-12-23 LAB — MAGNESIUM: Magnesium: 2.1 mg/dL (ref 1.7–2.4)

## 2020-12-23 NOTE — Evaluation (Signed)
Occupational Therapy Evaluation Patient Details Name: Paul Parker MRN: [id number] DOB: 1941-10-13 Today's Date: 12/23/2020    History of Present Illness presented to ER secondary to progressive SOB; admitted for management of acute hypoxic/hypoxemic respiratory failure due to COVID-19   Clinical Impression   Mr Bonsignore was seen for OT evaluation this date. Pt unable to provide PLOF - per chart PTA pt was Independent living with family available PRN. Pt presents to acute OT demonstrating impaired ADL performance and functional mobility 2/2 poor insight into deficits, decreased activity tolerance, and functional strength/balance deficits. Pt lethargic and nonverbal this date. Upon arrival pt found with lunch tray on floor and O2 removed - SpO2 78% on RA, resolved to 92% on 8L [place].   Pt currently requires MOD A self-feeding seated EOB. MAX A don B socks at bed level. MOD A sit<>stand - pt able to clear rear but unable to achieve upright posture. Pt would benefit from skilled OT to address noted impairments and functional limitations (see below for any additional details) in order to maximize safety and independence while minimizing falls risk and caregiver burden. Upon hospital discharge, recommend STR to maximize pt safety and return to PLOF.     Follow Up Recommendations  SNF    Equipment Recommendations  Other (comment) (TBD)    Recommendations for Other Services       Precautions / Restrictions Precautions Precautions: Fall Restrictions Weight Bearing Restrictions: No      Mobility Bed Mobility Overal bed mobility: Needs Assistance Bed Mobility: Supine to Sit;Sit to Supine     Supine to sit: Min assist Sit to supine: Mod assist        Transfers Overall transfer level: Needs assistance Equipment used: 1 person hand held assist Transfers: Sit to/from Stand Sit to Stand: Mod assist         General transfer comment: clears rear but unable to achieve fully upright posture     Balance Overall balance assessment: Needs assistance Sitting-balance support: No upper extremity supported;Feet supported Sitting balance-Leahy Scale: Fair     Standing balance support: Single extremity supported Standing balance-Leahy Scale: Poor                             ADL either performed or assessed with clinical judgement   ADL Overall ADL's : Needs assistance/impaired                                       General ADL Comments: MOD A self-feeding seated EOB. MAX A don B socks at bed level                  Pertinent Vitals/Pain Pain Assessment: No/denies pain     Hand Dominance Right   Extremity/Trunk Assessment Upper Extremity Assessment Upper Extremity Assessment: Generalized weakness   Lower Extremity Assessment Lower Extremity Assessment: Generalized weakness       Communication Communication Communication: No difficulties   Cognition Arousal/Alertness: Lethargic Behavior During Therapy: Flat affect Overall Cognitive Status: Impaired/Different from baseline                                 General Comments: slow processing with following single step commands.   General Comments  Found c O2 removed - SpO2 78% on RA, resolved to 92% on  8L [place]    Exercises Exercises: Other exercises Other Exercises Other Exercises: Pt educated re: importance of O2 Other Exercises: LBD, self-feeding, sup<>sit, sit<.stand   Shoulder Instructions      Home Living Family/patient expects to be discharged to:: Private residence Living Arrangements: Spouse/significant other Available Help at Discharge: Family;Available PRN/intermittently Type of Home: House       Home Layout: One level               Home Equipment: None          Prior Functioning/Environment Level of Independence: Independent        Comments: Indep with ADLs, household and community mobilization without assist device; denies fall history;  intermittent home O2 (?)        OT Problem List: Decreased strength;Decreased activity tolerance;Impaired balance (sitting and/or standing);Decreased cognition;Decreased safety awareness;Decreased knowledge of use of DME or AE      OT Treatment/Interventions: Self-care/ADL training;Therapeutic exercise;Energy conservation;DME and/or AE instruction;Therapeutic activities;Balance training;Patient/family education;Cognitive remediation/compensation    OT Goals(Current goals can be found in the care plan section) Acute Rehab OT Goals Patient Stated Goal: to get stronger OT Goal Formulation: With patient Time For Goal Achievement: 01/06/21 Potential to Achieve Goals: Fair ADL Goals Pt Will Perform Eating: with modified independence;bed level Pt Will Perform Upper Body Dressing: with supervision;sitting Pt Will Perform Toileting - Clothing Manipulation and hygiene: with mod assist;sit to/from stand  OT Frequency: Min 1X/week    AM-PAC OT "6 Clicks" Daily Activity     Outcome Measure Help from another person eating meals?: A Little Help from another person taking care of personal grooming?: A Lot Help from another person toileting, which includes using toliet, bedpan, or urinal?: A Lot Help from another person bathing (including washing, rinsing, drying)?: A Lot Help from another person to put on and taking off regular upper body clothing?: A Little Help from another person to put on and taking off regular lower body clothing?: A Lot 6 Click Score: 14   End of Session Equipment Utilized During Treatment: Oxygen Nurse Communication: Other (comment) (meal on floor)  Activity Tolerance: Patient tolerated treatment well Patient left: in bed;with call bell/phone within reach;with bed alarm set  OT Visit Diagnosis: Unsteadiness on feet (R26.81);Other abnormalities of gait and mobility (R26.89)                Time: 1350-1408 OT Time Calculation (min): 18 min Charges:  OT General  Charges $OT Visit: 1 Visit OT Evaluation $OT Eval Moderate Complexity: 1 Mod OT Treatments $Self Care/Home Management : 8-22 mins  Dessie Coma, M.S. OTR/L  12/23/20, 3:05 PM  ascom [phone]

## 2020-12-23 NOTE — Plan of Care (Signed)
  Problem: Clinical Measurements: Goal: Will remain free from infection Outcome: Progressing   Problem: Clinical Measurements: Goal: Respiratory complications will improve Outcome: Progressing   Problem: Pain Managment: Goal: General experience of comfort will improve Outcome: Progressing   Problem: Safety: Goal: Ability to remain free from injury will improve Outcome: Progressing   

## 2020-12-23 NOTE — Progress Notes (Signed)
Patient is confused and pulling off oxygen and tubes. Patient appears to be appropriate for a visitation according to the guidelines, consulted with the charge nurse and informed patient's wife that she would be allowed a visit for 2 hours per day.

## 2020-12-23 NOTE — Progress Notes (Signed)
PROGRESS NOTE    Paul Parker  YQM:[id number] DOB: 1940-12-30 DOA: 26-Dec-2020 PCP: Jon Billings, NP   Chief complaint.  Shortness of breath Brief Narrative:  Paul Parker a 80 y.o.malemedical history significant forhypertension, hyperlipidemia, coronary artery disease, history of prostate cancer, hypothyroid, depression/anxiety, history of atrial fibrillation converted to sinus rhythm on amiodarone in2013/2014, presented to the emergency department for chief concerns of worsening shortness of breath. He was diagnosed with COVID-19 on 11/27/2020. He has received first and second dose of COVID-vaccine but has not received a booster yet. He was admitted to the hospital for [id number] pneumonia complicated by acute hypoxic respiratory failure.    Assessment & Plan:   Active Problems:   Hypertension associated with diabetes ([place])   Acute hypoxemic respiratory failure due to COVID-19 [facility])   Prolonged QT interval   Hypernatremia   Atrial fibrillation (HCC)   Severe protein-calorie malnutrition (HCC)   Malnutrition of moderate degree   Pneumonia due to COVID-19 virus  #1. Acute hypoxemic respiratory failure secondary to COVID-19 pneumonia. Multilobar pneumonia secondary to COVID-19 infection. Acute metabolic encephalopathy. POA. Patient has a complicated steroids, remdesivir and baricitinib. Condition finally improving.  Currently on 8 L oxygen. Looking back, patient also had acute metabolic encephalopathy as his mental status gradually improving.  Also has some baseline dementia. Today, patient is alert and oriented to time place and person. We will continue wean oxygen.  Obtain physical therapy/Occupational Therapy.  Planning for nursing placement.  #2.  Severe hypernatremia. Condition improved.  Patient appetite is also improving.  3.  Uncontrolled type 2 diabetes with hyperglycemia Condition stable.  4.  Paroxysmal atrial fibrillation. Elevated troponin secondary to demand  ischemia from Covid. Continue anticoagulation with Eliquis.    DVT prophylaxis: Eliquis Code Status: Partial Family Communication: Daughter-in-law updated Disposition Plan:  .   Status is: Inpatient  Remains inpatient appropriate because:Inpatient level of care appropriate due to severity of illness   Dispo: The patient is from: Home              Anticipated d/c is to: SNF              Anticipated d/c date is: 3 days              Patient currently is not medically stable to d/c.   Difficult to place patient No        I/O last 3 completed shifts: In: 27 [P.O.:80] Out: 650 [Urine:650] No intake/output data recorded.     Consultants:   None  Procedures: None  Antimicrobials: None  Subjective: Patient condition finally improved.  He has less confusion, he had a better appetite.  Sodium level has normalized. Does not have any short of breath, still on 8 L oxygen. No fever or chills. No abdominal pain or nausea vomiting No dysuria hematuria.  Objective: Vitals:   12/23/20 0500 12/23/20 0529 12/23/20 0822 12/23/20 0832  BP: 118/67  116/62   Pulse:   68   Resp: 19  20   Temp: 98.2 F (36.8 C)     TempSrc: Oral     SpO2: 96% 93% 95% 91%  Weight: 69.6 kg     Height:        Intake/Output Summary (Last 24 hours) at 12/23/2020 1044 Last data filed at 12/23/2020 0509 Gross per 24 hour  Intake 80 ml  Output 350 ml  Net -270 ml   Filed Weights   12/20/20 0402 12/21/20 0500 12/23/20 0500  Weight: 70.3 kg 70.1 kg  69.6 kg    Examination:  General exam: Appears calm and comfortable  Respiratory system: Clear to auscultation. Respiratory effort normal. Cardiovascular system: S1 & S2 heard, RRR. No JVD, murmurs, rubs, gallops or clicks. No pedal edema. Gastrointestinal system: Abdomen is nondistended, soft and nontender. No organomegaly or masses felt. Normal bowel sounds heard. Central nervous system: Alert and oriented x3. No focal neurological  deficits. Extremities: Symmetric 5 x 5 power. Skin: No rashes, lesions or ulcers Psychiatry:  Mood & affect appropriate.     Data Reviewed: I have personally reviewed following labs and imaging studies  CBC: Recent Labs  Lab 12/18/20 0318 12/20/20 0641 12/21/20 0644 12/22/20 0657 12/23/20 0402  WBC 13.8* 13.4* 11.3* 12.2* 8.8  NEUTROABS  --  11.5* 9.8* 10.5* 7.4  HGB 12.4* 12.3* 12.2* 10.5* 10.9*  HCT 37.9* 38.2* 38.6* 33.7* 34.4*  MCV 95.2 98.5 99.2 99.1 98.0  PLT 200 227 212 182 [id number]   Basic Metabolic Panel: Recent Labs  Lab 12/20/20 0641 12/21/20 0644 12/22/20 0657 12/23/20 0402  NA 151* 146* 144 145  K 4.3 4.2 4.2 4.2  CL 109 106 105 108  CO2 32 30 30 28   GLUCOSE 93 185* 130* 95  BUN 38* 36* 28* 28*  CREATININE 1.19 1.06 1.03 0.81  CALCIUM 9.1 8.6* 8.3* 8.5*  MG  --  2.1 2.0 2.1   GFR: Estimated Creatinine Clearance: 72.8 mL/min (by C-G formula based on SCr of 0.81 mg/dL). Liver Function Tests: No results for input(s): AST, ALT, ALKPHOS, BILITOT, PROT, ALBUMIN in the last 168 hours. No results for input(s): LIPASE, AMYLASE in the last 168 hours. No results for input(s): AMMONIA in the last 168 hours. Coagulation Profile: No results for input(s): INR, PROTIME in the last 168 hours. Cardiac Enzymes: No results for input(s): CKTOTAL, CKMB, CKMBINDEX, TROPONINI in the last 168 hours. BNP (last 3 results) No results for input(s): PROBNP in the last 8760 hours. HbA1C: No results for input(s): HGBA1C in the last 72 hours. CBG: Recent Labs  Lab 12/22/20 0859 12/22/20 1142 12/22/20 1728 12/22/20 2222 12/23/20 0858  GLUCAP 128* 148* 121* 198* 115*   Lipid Profile: No results for input(s): CHOL, HDL, LDLCALC, TRIG, CHOLHDL, LDLDIRECT in the last 72 hours. Thyroid Function Tests: No results for input(s): TSH, T4TOTAL, FREET4, T3FREE, THYROIDAB in the last 72 hours. Anemia Panel: No results for input(s): VITAMINB12, FOLATE, FERRITIN, TIBC, IRON, RETICCTPCT  in the last 72 hours. Sepsis Labs: Recent Labs  Lab 12/20/20 0641  PROCALCITON 0.11    No results found for this or any previous visit (from the past 240 hour(s)).       Radiology Studies: No results found.      Scheduled Meds: . amiodarone  100 mg Oral Daily  . apixaban  5 mg Oral BID  . atorvastatin  40 mg Oral Daily  . chlorpheniramine-HYDROcodone  5 mL Oral Q12H  . clopidogrel  75 mg Oral Daily  . DULoxetine  30 mg Oral Daily  . feeding supplement (NEPRO CARB STEADY)  237 mL Oral TID BM  . ferrous sulfate  325 mg Oral Q breakfast  . insulin aspart  0-15 Units Subcutaneous TID WC  . insulin aspart  0-5 Units Subcutaneous QHS  . insulin glargine  10 Units Subcutaneous Daily  . levothyroxine  75 mcg Oral Q0600  . multivitamin with minerals  1 tablet Oral Daily  . senna-docusate  1 tablet Oral QHS   Continuous Infusions:   LOS: 16 days  Time spent: 36 minutes    Sharen Hones, MD [facility]   To contact the attending provider between 7A-7P or the covering provider during after hours 7P-7A, please log into the web site [email or web address] and access using universal [place] password for that web site. If you do not have the password, please call the hospital operator.  12/23/2020, 10:44 AM

## 2020-12-24 DIAGNOSIS — E870 Hyperosmolality and hypernatremia: Secondary | ICD-10-CM | POA: Diagnosis not present

## 2020-12-24 DIAGNOSIS — J9601 Acute respiratory failure with hypoxia: Secondary | ICD-10-CM | POA: Diagnosis not present

## 2020-12-24 DIAGNOSIS — I480 Paroxysmal atrial fibrillation: Secondary | ICD-10-CM | POA: Diagnosis not present

## 2020-12-24 DIAGNOSIS — U071 COVID-19: Secondary | ICD-10-CM | POA: Diagnosis not present

## 2020-12-24 LAB — CBC WITH DIFFERENTIAL/PLATELET
Abs Immature Granulocytes: 0.13 10*3/uL — ABNORMAL HIGH (ref 0.00–0.07)
Basophils Absolute: 0 10*3/uL (ref 0.0–0.1)
Basophils Relative: 0 %
Eosinophils Absolute: 0.1 10*3/uL (ref 0.0–0.5)
Eosinophils Relative: 1 %
HCT: 35.8 % — ABNORMAL LOW (ref 39.0–52.0)
Hemoglobin: 11.3 g/dL — ABNORMAL LOW (ref 13.0–17.0)
Immature Granulocytes: 2 %
Lymphocytes Relative: 12 %
Lymphs Abs: 0.9 10*3/uL (ref 0.7–4.0)
MCH: 31.1 pg (ref 26.0–34.0)
MCHC: 31.6 g/dL (ref 30.0–36.0)
MCV: 98.6 fL (ref 80.0–100.0)
Monocytes Absolute: 0.5 10*3/uL (ref 0.1–1.0)
Monocytes Relative: 6 %
Neutro Abs: 6.3 10*3/uL (ref 1.7–7.7)
Neutrophils Relative %: 79 %
Platelets: 187 10*3/uL (ref 150–400)
RBC: 3.63 MIL/uL — ABNORMAL LOW (ref 4.22–5.81)
RDW: 13.4 % (ref 11.5–15.5)
WBC: 7.9 10*3/uL (ref 4.0–10.5)
nRBC: 0 % (ref 0.0–0.2)

## 2020-12-24 LAB — GLUCOSE, CAPILLARY
Glucose-Capillary: 140 mg/dL — ABNORMAL HIGH (ref 70–99)
Glucose-Capillary: 111 mg/dL — ABNORMAL HIGH (ref 70–99)
Glucose-Capillary: 126 mg/dL — ABNORMAL HIGH (ref 70–99)
Glucose-Capillary: 159 mg/dL — ABNORMAL HIGH (ref 70–99)

## 2020-12-24 LAB — MAGNESIUM: Magnesium: 2.2 mg/dL (ref 1.7–2.4)

## 2020-12-24 NOTE — Progress Notes (Signed)
PROGRESS NOTE    Paul Parker  STM:[id number] DOB: 06-Nov-1941 DOA: Dec 17, 2020 PCP: Jon Billings, NP   Chief Complaint shortness of breath. Brief Narrative:  Paul Parker a 80 y.o.malemedical history significant forhypertension, hyperlipidemia, coronary artery disease, history of prostate cancer, hypothyroid, depression/anxiety, history of atrial fibrillation converted to sinus rhythm on amiodarone in2013/2014, presented to the emergency department for chief concerns of worsening shortness of breath. He was diagnosed with COVID-19 on 11/27/2020. He has received first and second dose of COVID-vaccine but has not received a booster yet. He was admitted to the hospital for [id number] pneumonia complicated by acute hypoxic respiratory failure.   Assessment & Plan:   Active Problems:   Hypertension associated with diabetes ([place])   Acute hypoxemic respiratory failure due to COVID-19 [facility])   Prolonged QT interval   Hypernatremia   Atrial fibrillation (HCC)   Severe protein-calorie malnutrition (HCC)   Malnutrition of moderate degree   Pneumonia due to COVID-19 virus  #1. Acute hypoxemic respiratory failure secondary to COVID-19 pneumonia. Multilobar pneumonia secondary to COVID-19 infection. Acute metabolic encephalopathy. POA. Patient condition much improved.  Oxygenation improved, he was on 100% heated high flow 4 days ago, down to 6 L today. Mental status also improving, discussed with patient wife, patient does have baseline dementia.  He still has poor appetite, supplement started. Also started PT OT, planning nursing home placement, probably early next week. Patient has completed steroids, remdesivir and baricitinib.  #2.  Severe hypernatremia. Sodium level dropped down from 151 to normal now.  His mental status also improved afterwards.  #3.  Uncontrolled type 2 diabetes with hyperglycemia. Condition stable.  4.  Paroxysmal atrial fibrillation. Elevated troponin secondary to  demand ischemia from Covid. Condition stable, continue Eliquis for anticoagulation.    DVT prophylaxis: Eliquis Code Status: Partial Family Communication:  Disposition Plan:  .   Status is: Inpatient  Remains inpatient appropriate because:Inpatient level of care appropriate due to severity of illness   Dispo: The patient is from: Home              Anticipated d/c is to: SNF              Anticipated d/c date is: 3 days              Patient currently is not medically stable to d/c.   Difficult to place patient No        I/O last 3 completed shifts: In: 60 [P.O.:80] Out: 350 [Urine:350] No intake/output data recorded.     Consultants:   None  Procedures: None  Antimicrobials:  None.  Subjective: Patient still has some confusion this morning, poor appetite.  No nausea vomiting or diarrhea. Short of breath much improved, currently on 6 L oxygen which is significant improvement versus 4 days ago. No fever chills. No abdominal pain or nausea vomiting.  Objective: Vitals:   12/23/20 2003 12/24/20 0433 12/24/20 0744 12/24/20 0843  BP: 140/89 133/67 112/60   Pulse: 64 66 62   Resp: 17 17 20    Temp: 97.8 F (36.6 C) 98.5 F (36.9 C) 98.4 F (36.9 C)   TempSrc:   Oral   SpO2: 96% 96% 94% 93%  Weight:  67.4 kg    Height:       No intake or output data in the 24 hours ending 12/24/20 0846 Filed Weights   12/21/20 0500 12/23/20 0500 12/24/20 0433  Weight: 70.1 kg 69.6 kg 67.4 kg    Examination:  General exam: Appears  calm and comfortable  Respiratory system: Clear to auscultation. Respiratory effort normal. Cardiovascular system: S1 & S2 heard, RRR. No JVD, murmurs, rubs, gallops or clicks. No pedal edema. Gastrointestinal system: Abdomen is nondistended, soft and nontender. No organomegaly or masses felt. Normal bowel sounds heard. Central nervous system: Alert and oriented x2. No focal neurological deficits. Extremities: Symmetric 5 x 5 power. Skin: No  rashes, lesions or ulcers Psychiatry:  Mood & affect appropriate.     Data Reviewed: I have personally reviewed following labs and imaging studies  CBC: Recent Labs  Lab 12/20/20 0641 12/21/20 0644 12/22/20 0657 12/23/20 0402 12/24/20 0535  WBC 13.4* 11.3* 12.2* 8.8 7.9  NEUTROABS 11.5* 9.8* 10.5* 7.4 6.3  HGB 12.3* 12.2* 10.5* 10.9* 11.3*  HCT 38.2* 38.6* 33.7* 34.4* 35.8*  MCV 98.5 99.2 99.1 98.0 98.6  PLT 227 212 182 187 [id number]   Basic Metabolic Panel: Recent Labs  Lab 12/20/20 0641 12/21/20 0644 12/22/20 0657 12/23/20 0402 12/24/20 0535  NA 151* 146* 144 145  --   K 4.3 4.2 4.2 4.2  --   CL 109 106 105 108  --   CO2 32 30 30 28   --   GLUCOSE 93 185* 130* 95  --   BUN 38* 36* 28* 28*  --   CREATININE 1.19 1.06 1.03 0.81  --   CALCIUM 9.1 8.6* 8.3* 8.5*  --   MG  --  2.1 2.0 2.1 2.2   GFR: Estimated Creatinine Clearance: 70.5 mL/min (by C-G formula based on SCr of 0.81 mg/dL). Liver Function Tests: No results for input(s): AST, ALT, ALKPHOS, BILITOT, PROT, ALBUMIN in the last 168 hours. No results for input(s): LIPASE, AMYLASE in the last 168 hours. No results for input(s): AMMONIA in the last 168 hours. Coagulation Profile: No results for input(s): INR, PROTIME in the last 168 hours. Cardiac Enzymes: No results for input(s): CKTOTAL, CKMB, CKMBINDEX, TROPONINI in the last 168 hours. BNP (last 3 results) No results for input(s): PROBNP in the last 8760 hours. HbA1C: No results for input(s): HGBA1C in the last 72 hours. CBG: Recent Labs  Lab 12/22/20 2222 12/23/20 0858 12/23/20 1209 12/23/20 1744 12/23/20 2124  GLUCAP 198* 115* 167* 224* 165*   Lipid Profile: No results for input(s): CHOL, HDL, LDLCALC, TRIG, CHOLHDL, LDLDIRECT in the last 72 hours. Thyroid Function Tests: No results for input(s): TSH, T4TOTAL, FREET4, T3FREE, THYROIDAB in the last 72 hours. Anemia Panel: No results for input(s): VITAMINB12, FOLATE, FERRITIN, TIBC, IRON, RETICCTPCT  in the last 72 hours. Sepsis Labs: Recent Labs  Lab 12/20/20 0641  PROCALCITON 0.11    No results found for this or any previous visit (from the past 240 hour(s)).       Radiology Studies: No results found.      Scheduled Meds: . amiodarone  100 mg Oral Daily  . apixaban  5 mg Oral BID  . atorvastatin  40 mg Oral Daily  . chlorpheniramine-HYDROcodone  5 mL Oral Q12H  . clopidogrel  75 mg Oral Daily  . DULoxetine  30 mg Oral Daily  . feeding supplement (NEPRO CARB STEADY)  237 mL Oral TID BM  . ferrous sulfate  325 mg Oral Q breakfast  . insulin aspart  0-15 Units Subcutaneous TID WC  . insulin aspart  0-5 Units Subcutaneous QHS  . insulin glargine  10 Units Subcutaneous Daily  . levothyroxine  75 mcg Oral Q0600  . multivitamin with minerals  1 tablet Oral Daily  . senna-docusate  1 tablet Oral QHS   Continuous Infusions:   LOS: 17 days    Time spent: 28 minutes    Sharen Hones, MD [facility]   To contact the attending provider between 7A-7P or the covering provider during after hours 7P-7A, please log into the web site [email or web address] and access using universal [place] password for that web site. If you do not have the password, please call the hospital operator.  12/24/2020, 8:46 AM

## 2020-12-24 NOTE — NC FL2 (Signed)
[place] LEVEL OF CARE SCREENING TOOL     IDENTIFICATION  Patient Name: Paul Parker Birthdate: 08/22/1941 Sex: male Admission Date (Current Location): 01-04-2021  [place] and [place] Number:  Engineering geologist and Address:  [facility] [address], [place], [place] [address]      Provider Number: [id number]  Attending Physician Name and Address:  Sharen Hones, MD  Relative Name and Phone Number:       Current Level of Care: Hospital Recommended Level of Care: [place] Prior Approval Number:    Date Approved/Denied:   PASRR Number: [id number]  Discharge Plan: Home    Current Diagnoses: Patient Active Problem List   Diagnosis Date Noted  . Pneumonia due to COVID-19 virus 12/16/2020  . Aortic atherosclerosis ([place]) 12/15/2020  . History of concussion 12/15/2020  . Hyperlipidemia associated with type 2 diabetes mellitus ([place]) 12/15/2020  . Malnutrition of moderate degree 12/13/2020  . Hypernatremia   . Atrial fibrillation ([place])   . Severe protein-calorie malnutrition ([place])   . Swelling of lower leg   . Acute hypoxemic respiratory failure due to COVID-19 ([place]) 2021-01-04  . Prolonged QT interval 01/04/2021  . SSS (sick sinus syndrome) ([place]) 04/05/2020  . 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 Memory changes 12/02/2019  . Recurrent major depressive disorder, in full remission ([place]) 09/30/2018  . Anemia 09/30/2018  . Advanced care planning/counseling discussion 10/28/2017  . Senile purpura ([place]) 08/30/2015  . CAD (coronary artery disease) 08/30/2015  . BPH (benign prostatic hyperplasia) 08/30/2015  . Hypothyroidism 08/30/2015  . Type 2 diabetes mellitus without obesity ([place])   . Hypertension associated with diabetes ([place])     Orientation RESPIRATION BLADDER Height & Weight     Self,Place  O2 (HFNC 5L) Incontinent Weight: 148 lb 8 oz (67.4 kg) Height:  5\' 11"  (180.3 cm)  BEHAVIORAL SYMPTOMS/MOOD  NEUROLOGICAL BOWEL NUTRITION STATUS      Incontinent  (DYS 2 diet, nectar thick liquids)  AMBULATORY STATUS COMMUNICATION OF NEEDS Skin   Extensive Assist Verbally Normal                       Personal Care Assistance Level of Assistance  Bathing,Feeding,Dressing Bathing Assistance: Maximum assistance Feeding assistance: Independent Dressing Assistance: Maximum assistance     Functional Limitations Info  Sight,Hearing,Speech Sight Info: Adequate Hearing Info: Adequate Speech Info: Adequate    SPECIAL CARE FACTORS FREQUENCY  PT (By licensed PT),OT (By licensed OT)     PT Frequency: 5x OT Frequency: 5x            Contractures Contractures Info: Not present    Additional Factors Info  Code Status,Allergies Code Status Info: Partial Allergies Info: Amoxicillin           Current Medications (12/24/2020):  This is the current hospital active medication list Current Facility-Administered Medications  Medication Dose Route Frequency Provider Last Rate Last Admin  . acetaminophen (TYLENOL) tablet 325 mg  325 mg Oral Q6H PRN Cox, Amy N, DO      . amiodarone (PACERONE) tablet 100 mg  100 mg Oral Daily Cox, Amy N, DO   100 mg at 12/23/20 1031  . apixaban (ELIQUIS) tablet 5 mg  5 mg Oral BID Sharen Hones, MD   5 mg at 12/24/20 1016  . atorvastatin (LIPITOR) tablet 40 mg  40 mg Oral Daily Cox, Amy N, DO   40 mg at 12/24/20 1016  . chlorpheniramine-HYDROcodone (TUSSIONEX) 10-8 MG/5ML  suspension 5 mL  5 mL Oral Q12H Fritzi Mandes, MD   5 mL at 12/23/20 2124  . clopidogrel (PLAVIX) tablet 75 mg  75 mg Oral Daily Cox, Amy N, DO   75 mg at 12/24/20 1015  . DULoxetine (CYMBALTA) DR capsule 30 mg  30 mg Oral Daily Cox, Amy N, DO   30 mg at 12/24/20 1015  . feeding supplement (NEPRO CARB STEADY) liquid 237 mL  237 mL Oral TID BM Jennye Boroughs, MD 0 mL/hr at 12/20/20 0724 237 mL at 12/23/20 2123  . ferrous sulfate tablet 325 mg  325 mg Oral Q breakfast Cox, Amy N, DO   325 mg at  12/24/20 1015  . guaiFENesin-dextromethorphan (ROBITUSSIN DM) 100-10 MG/5ML syrup 10 mL  10 mL Oral Q4H PRN Fritzi Mandes, MD   10 mL at 12/23/20 1054  . insulin aspart (novoLOG) injection 0-15 Units  0-15 Units Subcutaneous TID WC Cox, Amy N, DO   2 Units at 12/24/20 1016  . insulin aspart (novoLOG) injection 0-5 Units  0-5 Units Subcutaneous QHS Cox, Amy N, DO   3 Units at 12/20/20 2213  . insulin glargine (LANTUS) injection 10 Units  10 Units Subcutaneous Daily Fritzi Mandes, MD   10 Units at 12/24/20 1016  . levothyroxine (SYNTHROID) tablet 75 mcg  75 mcg Oral Q0600 Cox, Amy N, DO   75 mcg at 12/24/20 1016  . multivitamin with minerals tablet 1 tablet  1 tablet Oral Daily Fritzi Mandes, MD   1 tablet at 12/24/20 1016  . ondansetron (ZOFRAN) tablet 4 mg  4 mg Oral Q6H PRN Cox, Amy N, DO       Or  . ondansetron (ZOFRAN) injection 4 mg  4 mg Intravenous Q6H PRN Cox, Amy N, DO      . polyethylene glycol (MIRALAX / GLYCOLAX) packet 17 g  17 g Oral Daily PRN Jennye Boroughs, MD   17 g at 12/14/20 1508  . senna-docusate (Senokot-S) tablet 1 tablet  1 tablet Oral QHS Jennye Boroughs, MD   1 tablet at 12/23/20 2124     Discharge Medications: Please see discharge summary for a list of discharge medications.  Relevant Imaging Results:  Relevant Lab Results:   Additional Information [id number]  Gerrianne Scale Liandra Mendia, LCSW

## 2020-12-24 NOTE — TOC Initial Note (Signed)
Transition of Care [facility]) - Initial/Assessment Note    Patient Details  Name: Paul Parker MRN: [id number] Date of Birth: [birth year]  Transition of [facility]) CM/SW Contact:    Eileen Stanford, LCSW Phone Number: 12/24/2020, 1:25 PM  Clinical Narrative:   Pt is on alert to self and place. CSW spoke with pt's daughter in law and she is agreeable to pt going to SNF at d/c. Family would prefer [facility] facility. CSW will f/o to the [facility].   Pt positive for Covid three weeks ago. Pt vaccinated.              Expected Discharge Plan: Skilled Nursing Facility Barriers to Discharge: Continued Medical Work up   Patient Goals and CMS Choice Patient states their goals for this hospitalization and ongoing recovery are:: to get pt to rehab to get better   Choice offered to / list presented to : Adult Children  Expected Discharge Plan and Services Expected Discharge Plan: [place] In-house Referral: NA   Post Acute Care Choice: [place] Living arrangements for the past 2 months: Single Family Home                                      Prior Living Arrangements/Services Living arrangements for the past 2 months: Single Family Home Lives with:: Adult Children,Spouse Patient language and need for interpreter reviewed:: Yes Do you feel safe going back to the place where you live?: Yes      Need for Family Participation in Patient Care: Yes (Comment) Care giver support system in place?: Yes (comment)   Criminal Activity/Legal Involvement Pertinent to Current Situation/Hospitalization: No - Comment as needed  Activities of Daily Living      Permission Sought/Granted Permission sought to share information with : Family Supports    Share Information with NAME: dawn     Permission granted to share info w Relationship: daughter in law     Emotional Assessment Appearance:: Appears stated age Attitude/Demeanor/Rapport: Unable to  Assess Affect (typically observed): Unable to Assess Orientation: : Oriented to Place,Oriented to Self Alcohol / Substance Use: Not Applicable Psych Involvement: No (comment)  Admission diagnosis:  Sepsis ([place]) [A41.9] Swelling of both lower extremities [M79.89] Swelling of lower leg [M79.89] Acute hypoxemic respiratory failure due to COVID-19 ([place]) [U07.1, J96.01] Patient Active Problem List   Diagnosis Date Noted  . Pneumonia due to COVID-19 virus 12/16/2020  . Aortic atherosclerosis ([place]) 12/15/2020  . History of concussion 12/15/2020  . Hyperlipidemia associated with type 2 diabetes mellitus ([place]) 12/15/2020  . Malnutrition of moderate degree 12/13/2020  . Hypernatremia   . Atrial fibrillation ([place])   . Severe protein-calorie malnutrition ([place])   . Swelling of lower leg   . Acute hypoxemic respiratory failure due to COVID-19 ([place]) 12/21/2020  . Prolonged QT interval 11/28/2020  . SSS (sick sinus syndrome) ([place]) 04/05/2020  . 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 Memory changes 12/02/2019  . Recurrent major depressive disorder, in full remission ([place]) 09/30/2018  . Anemia 09/30/2018  . Advanced care planning/counseling discussion 10/28/2017  . Senile purpura ([place]) 08/30/2015  . CAD (coronary artery disease) 08/30/2015  . BPH (benign prostatic hyperplasia) 08/30/2015  . Hypothyroidism 08/30/2015  . Type 2 diabetes mellitus without obesity ([place])   . Hypertension associated with diabetes ([place])    PCP:  Jon Billings, NP Pharmacy:   [place], [place] -  [place] [address] Phone: [phone] Fax: [phone]  [place], [place] - [place] [address] Phone: [phone] Fax: [phone]     Social Determinants of Health (SDOH) Interventions    Readmission Risk Interventions No flowsheet data found.

## 2020-12-25 DIAGNOSIS — E870 Hyperosmolality and hypernatremia: Secondary | ICD-10-CM | POA: Diagnosis not present

## 2020-12-25 DIAGNOSIS — U071 COVID-19: Secondary | ICD-10-CM | POA: Diagnosis not present

## 2020-12-25 DIAGNOSIS — J9601 Acute respiratory failure with hypoxia: Secondary | ICD-10-CM | POA: Diagnosis not present

## 2020-12-25 DIAGNOSIS — I480 Paroxysmal atrial fibrillation: Secondary | ICD-10-CM | POA: Diagnosis not present

## 2020-12-25 LAB — CBC WITH DIFFERENTIAL/PLATELET
Abs Immature Granulocytes: 0.07 10*3/uL (ref 0.00–0.07)
Basophils Absolute: 0 10*3/uL (ref 0.0–0.1)
Basophils Relative: 0 %
Eosinophils Absolute: 0.1 10*3/uL (ref 0.0–0.5)
Eosinophils Relative: 2 %
HCT: 37.3 % — ABNORMAL LOW (ref 39.0–52.0)
Hemoglobin: 11.8 g/dL — ABNORMAL LOW (ref 13.0–17.0)
Immature Granulocytes: 1 %
Lymphocytes Relative: 10 %
Lymphs Abs: 0.9 10*3/uL (ref 0.7–4.0)
MCH: 31 pg (ref 26.0–34.0)
MCHC: 31.6 g/dL (ref 30.0–36.0)
MCV: 97.9 fL (ref 80.0–100.0)
Monocytes Absolute: 0.5 10*3/uL (ref 0.1–1.0)
Monocytes Relative: 6 %
Neutro Abs: 7.3 10*3/uL (ref 1.7–7.7)
Neutrophils Relative %: 81 %
Platelets: 200 10*3/uL (ref 150–400)
RBC: 3.81 MIL/uL — ABNORMAL LOW (ref 4.22–5.81)
RDW: 13.7 % (ref 11.5–15.5)
WBC: 8.9 10*3/uL (ref 4.0–10.5)
nRBC: 0 % (ref 0.0–0.2)

## 2020-12-25 LAB — BASIC METABOLIC PANEL
Anion gap: 12 (ref 5–15)
BUN: 30 mg/dL — ABNORMAL HIGH (ref 8–23)
CO2: 24 mmol/L (ref 22–32)
Calcium: 8.7 mg/dL — ABNORMAL LOW (ref 8.9–10.3)
Chloride: 110 mmol/L (ref 98–111)
Creatinine, Ser: 1.08 mg/dL (ref 0.61–1.24)
GFR, Estimated: >60 mL/min (ref >60)
Glucose, Bld: 119 mg/dL — ABNORMAL HIGH (ref 70–99)
Potassium: 4.1 mmol/L (ref 3.5–5.1)
Sodium: 146 mmol/L — ABNORMAL HIGH (ref 135–145)

## 2020-12-25 LAB — GLUCOSE, CAPILLARY
Glucose-Capillary: 45 mg/dL — ABNORMAL LOW (ref 70–99)
Glucose-Capillary: 175 mg/dL — ABNORMAL HIGH (ref 70–99)
Glucose-Capillary: 176 mg/dL — ABNORMAL HIGH (ref 70–99)
Glucose-Capillary: 196 mg/dL — ABNORMAL HIGH (ref 70–99)
Glucose-Capillary: 177 mg/dL — ABNORMAL HIGH (ref 70–99)

## 2020-12-25 LAB — BRAIN NATRIURETIC PEPTIDE: B Natriuretic Peptide: 84.7 pg/mL (ref 0.0–100.0)

## 2020-12-25 LAB — MAGNESIUM: Magnesium: 2.3 mg/dL (ref 1.7–2.4)

## 2020-12-25 MED ORDER — DEXTROSE 5 % IV SOLN: INTRAVENOUS | Status: AC

## 2020-12-25 NOTE — Progress Notes (Addendum)
PROGRESS NOTE    Paul Parker  XAJ:[id number] DOB: [birth year] DOA: 12/12/2020 PCP: Jon Billings, NP   Chief complaint of shortness of breath Brief Narrative:  Paul Parker a 80 y.o.malemedical history significant forhypertension, hyperlipidemia, coronary artery disease, history of prostate cancer, hypothyroid, depression/anxiety, history of atrial fibrillation converted to sinus rhythm on amiodarone in2013/2014, presented to the emergency department for chief concerns of worsening shortness of breath. He was diagnosed with COVID-19 on 11/27/2020. He has received first and second dose of COVID-vaccine but has not received a booster yet. He was admitted to the hospital for CNOBS-96 pneumonia complicated by acute hypoxic respiratory failure.   Assessment & Plan:   Active Problems:   Hypertension associated with diabetes ([place])   Acute hypoxemic respiratory failure due to COVID-19 [facility])   Prolonged QT interval   Hypernatremia   Atrial fibrillation (HCC)   Severe protein-calorie malnutrition (HCC)   Malnutrition of moderate degree   Pneumonia due to COVID-19 virus   #1. Acute hypoxemic respiratory failure secondary to COVID-19 pneumonia. Multilobar pneumonia secondary to COVID-19 infection. Acute metabolic encephalopathy. Baseline dementia. Patient currently on 8 L oxygen.  He still very weak, poor appetite.  Confused. Patient short of breath has improved. Patient has been seen by PT and OT, spoke with the nurse, will need to get patient to the chair. Patient has completed steroids, remdesivir and baricitinib. I will recheck a BMP and a procalcitonin level. Patient first diagnosed with Covid positive on 1/4, will discontinue isolation.  #2.  Severe hypernatremia. Sodium level improved.  Due to poor p.o. intake, recheck BMP again.  #3.  Uncontrolled type 2 diabetes with hyperglycemia. Hypoglycemia. He developed hypoglycemia this am with glucose at 40s, will dc long acting  insulin.  4.  Paroxysmal atrial fibrillation. Elevated troponin secondary to demand ischemia from Covid. Continue Eliquis.    DVT prophylaxis: Eliquis Code Status: Partial Family Communication: daughter in law updated. Disposition Plan:  .   Status is: Inpatient  Remains inpatient appropriate because:Inpatient level of care appropriate due to severity of illness   Dispo: The patient is from: Home              Anticipated d/c is to: SNF              Anticipated d/c date is: > 3 days              Patient currently is not medically stable to d/c.   Difficult to place patient No        I/O last 3 completed shifts: In: 100 [P.O.:100] Out: 100 [Urine:100] No intake/output data recorded.     Consultants:   none  Procedures: none  Antimicrobials:none  Subjective: Patient oxygenation is better, not short of breath anymore.  However, patient still very weak, he can barely stand with occupational therapist and physical therapist.  Appetite still poor, but no nausea vomiting. He has no fever chills. No Nausea vomiting No dysuria hematuria.  Objective: Vitals:   12/24/20 1933 12/25/20 0233 12/25/20 0359 12/25/20 0829  BP: (!) 98/57  121/80   Pulse: 65  76   Resp: 17  15   Temp: 98.6 F (37 C)  97.8 F (36.6 C)   TempSrc: Oral  Oral   SpO2: 98% 94% 92% 91%  Weight:      Height:        Intake/Output Summary (Last 24 hours) at 12/25/2020 2836 Last data filed at 12/24/2020 2100 Gross per 24 hour  Intake 100 ml  Output 100 ml  Net 0 ml   Filed Weights   12/21/20 0500 12/23/20 0500 12/24/20 0433  Weight: 70.1 kg 69.6 kg 67.4 kg    Examination:  General exam: Appears calm and comfortable  Respiratory system: Clear to auscultation. Respiratory effort normal. Cardiovascular system: S1 & S2 heard, RRR. No JVD, murmurs, rubs, gallops or clicks. No pedal edema. Gastrointestinal system: Abdomen is nondistended, soft and nontender. No organomegaly or masses felt.  Normal bowel sounds heard. Central nervous system: Drowsy and oriented to place and person. No focal neurological deficits. Extremities: Symmetric  Skin: No rashes, lesions or ulcers .     Data Reviewed: I have personally reviewed following labs and imaging studies  CBC: Recent Labs  Lab 12/21/20 0644 12/22/20 0657 12/23/20 0402 12/24/20 0535 12/25/20 0405  WBC 11.3* 12.2* 8.8 7.9 8.9  NEUTROABS 9.8* 10.5* 7.4 6.3 7.3  HGB 12.2* 10.5* 10.9* 11.3* 11.8*  HCT 38.6* 33.7* 34.4* 35.8* 37.3*  MCV 99.2 99.1 98.0 98.6 97.9  PLT 212 182 187 187 [id number]   Basic Metabolic Panel: Recent Labs  Lab 12/20/20 0641 12/21/20 0644 12/22/20 0657 12/23/20 0402 12/24/20 0535 12/25/20 0405  NA 151* 146* 144 145  --   --   K 4.3 4.2 4.2 4.2  --   --   CL 109 106 105 108  --   --   CO2 32 30 30 28   --   --   GLUCOSE 93 185* 130* 95  --   --   BUN 38* 36* 28* 28*  --   --   CREATININE 1.19 1.06 1.03 0.81  --   --   CALCIUM 9.1 8.6* 8.3* 8.5*  --   --   MG  --  2.1 2.0 2.1 2.2 2.3   GFR: Estimated Creatinine Clearance: 70.5 mL/min (by C-G formula based on SCr of 0.81 mg/dL). Liver Function Tests: No results for input(s): AST, ALT, ALKPHOS, BILITOT, PROT, ALBUMIN in the last 168 hours. No results for input(s): LIPASE, AMYLASE in the last 168 hours. No results for input(s): AMMONIA in the last 168 hours. Coagulation Profile: No results for input(s): INR, PROTIME in the last 168 hours. Cardiac Enzymes: No results for input(s): CKTOTAL, CKMB, CKMBINDEX, TROPONINI in the last 168 hours. BNP (last 3 results) No results for input(s): PROBNP in the last 8760 hours. HbA1C: No results for input(s): HGBA1C in the last 72 hours. CBG: Recent Labs  Lab 12/24/20 0852 12/24/20 1310 12/24/20 1831 12/24/20 2058 12/25/20 0817  GLUCAP 140* 111* 126* 159* 45*   Lipid Profile: No results for input(s): CHOL, HDL, LDLCALC, TRIG, CHOLHDL, LDLDIRECT in the last 72 hours. Thyroid Function Tests: No  results for input(s): TSH, T4TOTAL, FREET4, T3FREE, THYROIDAB in the last 72 hours. Anemia Panel: No results for input(s): VITAMINB12, FOLATE, FERRITIN, TIBC, IRON, RETICCTPCT in the last 72 hours. Sepsis Labs: Recent Labs  Lab 12/20/20 0641  PROCALCITON 0.11    No results found for this or any previous visit (from the past 240 hour(s)).       Radiology Studies: No results found.      Scheduled Meds: . amiodarone  100 mg Oral Daily  . apixaban  5 mg Oral BID  . atorvastatin  40 mg Oral Daily  . chlorpheniramine-HYDROcodone  5 mL Oral Q12H  . clopidogrel  75 mg Oral Daily  . DULoxetine  30 mg Oral Daily  . feeding supplement (NEPRO CARB STEADY)  237 mL Oral TID BM  . ferrous  sulfate  325 mg Oral Q breakfast  . insulin aspart  0-15 Units Subcutaneous TID WC  . insulin aspart  0-5 Units Subcutaneous QHS  . insulin glargine  10 Units Subcutaneous Daily  . levothyroxine  75 mcg Oral Q0600  . multivitamin with minerals  1 tablet Oral Daily  . senna-docusate  1 tablet Oral QHS   Continuous Infusions:   LOS: 18 days    Time spent: 28 minutes    Sharen Hones, MD [facility]   To contact the attending provider between 7A-7P or the covering provider during after hours 7P-7A, please log into the web site [email or web address] and access using universal [place] password for that web site. If you do not have the password, please call the hospital operator.  12/25/2020, 8:33 AM
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
## 2020-12-25 NOTE — Progress Notes (Addendum)
Bladder scan > 400 without output in 8 hours. Messaged via secure chat MD [place] and he  Gave order to in/out cath patient.   560mL drained with in/out cath  MD Notified

## 2020-12-25 NOTE — Progress Notes (Signed)
OT Cancellation Note  Patient Details Name: Paul Parker MRN: [id number] DOB: 1941-05-17   Cancelled Treatment:    Reason Eval/Treat Not Completed: Other (comment). Pt currently working with SLP. OT to re-attempt when pt is next available.   Darleen Crocker, MS, OTR/L , CBIS ascom [phone]  12/25/20, 3:13 PM   12/25/2020, 3:12 PM

## 2020-12-25 NOTE — Progress Notes (Signed)
Physical Therapy Treatment Patient Details Name: Paul Parker MRN: [id number] DOB: 16-Apr-1941 Today's Date: 12/25/2020    History of Present Illness presented to ER secondary to progressive SOB; admitted for management of acute hypoxic/hypoxemic respiratory failure due to COVID-19    PT Comments    Pt with varying levels of awareness; eyes closed for 90% of session. Able to follow commands consistently and open eyes when prompted. The patient exhibited improvement in mobility this session, but still had deficits in functional mobility, strength, endurance, and limited by respiratory status. The patient was able to participate in several supine exercises with tactile cues. Supine to sit with minA on 9L via HFNC. Able to sit with fair  Balance and CGA/supervision ~57minutes, but desaturated to high 70s, total assisted to supine and progressed to 15L to improve >90%. RN in room and consented to NRB mask for re-attempt at mobility at 10L. Pt able to return to EOB, sit <> Stand with RW and minA, but unable to step towards recliner. Two person handheld assist (minAx2) for pt to take several steps safely to recliner. Pt up in chair, all needs in reach spO2 reading 85% on 11L, RN aware. The patient would benefit from further skilled PT intervention to continue to progress towards goals. Recommendation remains appropriate.       Follow Up Recommendations  SNF     Equipment Recommendations  Other (comment) (to  be determinted at next level of care)    Recommendations for Other Services       Precautions / Restrictions Precautions Precautions: Fall Precaution Comments: currently patient has a sitter in the room Restrictions Weight Bearing Restrictions: No    Mobility  Bed Mobility Overal bed mobility: Needs Assistance Bed Mobility: Supine to Sit;Sit to Supine     Supine to sit: Min assist Sit to supine: Total assist   General bed mobility comments: total A to return to supine due to  desaturation  Transfers Overall transfer level: Needs assistance Equipment used: Rolling walker (2 wheeled);2 person hand held assist Transfers: Sit to/from Stand Sit to Stand: Min assist;+2 safety/equipment         General transfer comment: Pt able to come into standing with minA and RW, but unsteadiness noted. Pt had difficulty coordinating RW management and side stepping. Returned to sitting for rest, and then reattempted with minAx2 for safety with two person handheld assist  Ambulation/Gait Ambulation/Gait assistance: Min assist;+2 safety/equipment Gait Distance (Feet): 2 Feet Assistive device: 2 person hand held assist       General Gait Details: on NRB throughout   [place] Mobility    Modified Rankin (Stroke Patients Only)       Balance Overall balance assessment: Needs assistance Sitting-balance support: No upper extremity supported;Feet supported Sitting balance-Leahy Scale: Fair Sitting balance - Comments: patient flexed neck and trunk     Standing balance-Leahy Scale: Poor Standing balance comment: pt minAx2 for standing                            Cognition Arousal/Alertness: Awake/alert;Lethargic (variable, but pt with eyes closed for 90% of the session) Behavior During Therapy: Flat affect Overall Cognitive Status: No family/caregiver present to determine baseline cognitive functioning  General Comments: pt oriented to self, "[place]", disoriented to place, time, situation      Exercises General Exercises - Lower Extremity Ankle Circles/Pumps: AROM;Strengthening;Both;10 reps;Supine Short Arc Quad: AROM;Both;10 reps;Supine Heel Slides: Strengthening;Both;10 reps;Supine Hip ABduction/ADduction: Strengthening;Both;10 reps;Supine;AROM    General Comments General comments (skin integrity, edema, etc.): on HFNC at 8L, 9L for prep to EOB. Ultimately returned to  supine and placed on 15L. with RN consent placed on NRB at 10L for second mobility attempt      Pertinent Vitals/Pain Pain Assessment: No/denies pain    Home Living                      Prior Function            PT Goals (current goals can now be found in the care plan section) Progress towards PT goals: Progressing toward goals    Frequency    Min 2X/week      PT Plan Current plan remains appropriate    Co-evaluation              AM-PAC PT "6 Clicks" Mobility   Outcome Measure  Help needed turning from your back to your side while in a flat bed without using bedrails?: A Little Help needed moving from lying on your back to sitting on the side of a flat bed without using bedrails?: A Little Help needed moving to and from a bed to a chair (including a wheelchair)?: A Lot Help needed standing up from a chair using your arms (e.g., wheelchair or bedside chair)?: A Lot Help needed to walk in hospital room?: A Lot Help needed climbing 3-5 steps with a railing? : A Lot 6 Click Score: 14    End of Session Equipment Utilized During Treatment: Oxygen (8L - 10L NWB at end of session) Activity Tolerance: Patient limited by fatigue Patient left: in bed;with call bell/phone within reach;with bed alarm set Nurse Communication: Mobility status PT Visit Diagnosis: Muscle weakness (generalized) (M62.81);Difficulty in walking, not elsewhere classified (R26.2)     Time: 9629-5284 PT Time Calculation (min) (ACUTE ONLY): 30 min  Charges:  $Therapeutic Exercise: 23-37 mins                     Lieutenant Diego PT, DPT 1:42 PM,12/25/20

## 2020-12-26 ENCOUNTER — Inpatient Hospital Stay: Payer: Medicare Other

## 2020-12-26 DIAGNOSIS — J9601 Acute respiratory failure with hypoxia: Secondary | ICD-10-CM | POA: Diagnosis not present

## 2020-12-26 DIAGNOSIS — E870 Hyperosmolality and hypernatremia: Secondary | ICD-10-CM | POA: Diagnosis not present

## 2020-12-26 DIAGNOSIS — I480 Paroxysmal atrial fibrillation: Secondary | ICD-10-CM | POA: Diagnosis not present

## 2020-12-26 DIAGNOSIS — U071 COVID-19: Secondary | ICD-10-CM | POA: Diagnosis not present

## 2020-12-26 LAB — CBC WITH DIFFERENTIAL/PLATELET
Abs Immature Granulocytes: 0.25 10*3/uL — ABNORMAL HIGH (ref 0.00–0.07)
Basophils Absolute: 0 10*3/uL (ref 0.0–0.1)
Basophils Relative: 0 %
Eosinophils Absolute: 0 10*3/uL (ref 0.0–0.5)
Eosinophils Relative: 0 %
HCT: 31 % — ABNORMAL LOW (ref 39.0–52.0)
Hemoglobin: 10 g/dL — ABNORMAL LOW (ref 13.0–17.0)
Immature Granulocytes: 2 %
Lymphocytes Relative: 7 %
Lymphs Abs: 1.1 10*3/uL (ref 0.7–4.0)
MCH: 31.6 pg (ref 26.0–34.0)
MCHC: 32.3 g/dL (ref 30.0–36.0)
MCV: 98.1 fL (ref 80.0–100.0)
Monocytes Absolute: 0.7 10*3/uL (ref 0.1–1.0)
Monocytes Relative: 5 %
Neutro Abs: 13.4 10*3/uL — ABNORMAL HIGH (ref 1.7–7.7)
Neutrophils Relative %: 86 %
Platelets: 212 10*3/uL (ref 150–400)
RBC: 3.16 MIL/uL — ABNORMAL LOW (ref 4.22–5.81)
RDW: 13.8 % (ref 11.5–15.5)
WBC: 15.5 10*3/uL — ABNORMAL HIGH (ref 4.0–10.5)
nRBC: 0 % (ref 0.0–0.2)

## 2020-12-26 LAB — BASIC METABOLIC PANEL
Anion gap: 17 — ABNORMAL HIGH (ref 5–15)
BUN: 48 mg/dL — ABNORMAL HIGH (ref 8–23)
CO2: 22 mmol/L (ref 22–32)
Calcium: 8.6 mg/dL — ABNORMAL LOW (ref 8.9–10.3)
Chloride: 106 mmol/L (ref 98–111)
Creatinine, Ser: 2.47 mg/dL — ABNORMAL HIGH (ref 0.61–1.24)
GFR, Estimated: 26 mL/min — ABNORMAL LOW (ref >60)
Glucose, Bld: 239 mg/dL — ABNORMAL HIGH (ref 70–99)
Potassium: 5.1 mmol/L (ref 3.5–5.1)
Sodium: 145 mmol/L (ref 135–145)

## 2020-12-26 LAB — GLUCOSE, CAPILLARY
Glucose-Capillary: 246 mg/dL — ABNORMAL HIGH (ref 70–99)
Glucose-Capillary: 275 mg/dL — ABNORMAL HIGH (ref 70–99)
Glucose-Capillary: 260 mg/dL — ABNORMAL HIGH (ref 70–99)
Glucose-Capillary: 189 mg/dL — ABNORMAL HIGH (ref 70–99)

## 2020-12-26 LAB — MAGNESIUM: Magnesium: 2.5 mg/dL — ABNORMAL HIGH (ref 1.7–2.4)

## 2020-12-26 LAB — PROCALCITONIN: Procalcitonin: 0.2 ng/mL

## 2020-12-26 MED ORDER — SODIUM CHLORIDE 0.45 % IV SOLN: INTRAVENOUS | Status: DC

## 2020-12-26 MED ORDER — PROSOURCE TF PO LIQD
45.0000 mL | Freq: Two times a day (BID) | ORAL | Status: DC
  Filled 2020-12-26 (×2): qty 45

## 2020-12-26 MED ORDER — CHLORHEXIDINE GLUCONATE CLOTH 2 % EX PADS
6.0000 | MEDICATED_PAD | Freq: Every day | CUTANEOUS | Status: DC
  Administered 2020-12-26 – 2020-12-27 (×2): 6 via TOPICAL

## 2020-12-26 MED ORDER — OSMOLITE 1.5 CAL PO LIQD: 1000.0000 mL | ORAL | Status: DC

## 2020-12-26 MED ORDER — FREE WATER: 100.0000 mL | Status: DC

## 2020-12-26 NOTE — Progress Notes (Signed)
OT Cancellation Note  Patient Details Name: Paul Parker MRN: [id number] DOB: August 25, 1941   Cancelled Treatment:    Reason Eval/Treat Not Completed: Medical issues which prohibited therapy. Pt with significant increased oxygen requirement to 50L. Current O2 saturation 88% at rest. OT to follow up when pt is better able to participate.  Darleen Crocker, [name], OTR/L , CBIS ascom [phone]  12/26/20, 2:56 PM   12/26/2020, 2:54 PM

## 2020-12-26 NOTE — Progress Notes (Signed)
Inpatient Diabetes Program Recommendations  AACE/ADA: New Consensus Statement on Inpatient Glycemic Control (2015)  Target Ranges:  Prepandial:   less than 140 mg/dL      Peak postprandial:   less than 180 mg/dL (1-2 hours)      Critically ill patients:  140 - 180 mg/dL   Results for [facility] (MRN [id number]) as of 12/26/2020 12:58  Ref. Range 12/25/2020 08:17 12/25/2020 09:02 12/25/2020 12:20 12/25/2020 16:02 12/25/2020 21:01  Glucose-Capillary Latest Ref Range: 70 - 99 mg/dL 45 (L) 175 (H) 176 (H)  3 units NOVOLOG  196 (H)  3 units NOVOLOG  177 (H)   Results for [facility] (MRN [id number]) as of 12/26/2020 12:58  Ref. Range 12/26/2020 09:18 12/26/2020 11:41  Glucose-Capillary Latest Ref Range: 70 - 99 mg/dL 246 (H)  5 units NOVOLOG 275 (H)    Home DM Meds: Metformin 500 mg BID       Actos 45 mg daily  Current Orders: Novolog 0-15 units TID ac/hs   Severe HYPO yest AM (CBG 45)--Got 10 units Lantus the day prior--Lantus stopped.   No Lantus given yest.   CBGs 246 and 275 so far today   MD- Please consider adding back small amount of basal insulin:  Lantus 5 units QHS     --Will follow patient during hospitalization--  Wyn Quaker RN, MSN, CDE Diabetes Coordinator Inpatient Glycemic Control Team Team Pager: [phone] (8a-5p)

## 2020-12-26 NOTE — Progress Notes (Signed)
PT Cancellation Note  Patient Details Name: Paul Parker MRN: [id number] DOB: Aug 30, 1941   Cancelled Treatment:    Reason Eval/Treat Not Completed: Medical issues which prohibited therapy. Patient's O2 needs have increased. Patient is not appropriate at this time for PT. Will continue to monitor and see as appropriate.     Ahlani Wickes 12/26/2020, 2:53 PM

## 2020-12-26 NOTE — Progress Notes (Signed)
Nutrition Follow-up  DOCUMENTATION CODES:   Non-severe (moderate) malnutrition in context of social or environmental circumstances  INTERVENTION:   If feeding tube placed, recommend:  Osmolite 1.5 @ 15m/hr- Initiate at 395mhr and increase by 1037mr q 8 hours until goal rate is reached.   Pro-Source 35m76mD via tube, provides 40kcal and 11g of protein per serving   Free water flushes 100ml49mhours  Regimen provides 2240kcal/day, 112g/day protein and 1697ml/83mfree water   Pt at high refeed risk; recommend monitor potassium, magnesium and phosphorus labs daily until stable  Continue Nepro Shake po TID, each supplement provides 425 kcal and 19 grams protein  Continue Magic cup TID with meals, each supplement provides 290 kcal and 9 grams of protein  Continue MVI daily   NUTRITION DIAGNOSIS:   Moderate Malnutrition related to social / environmental circumstances (suspected inadequate oral intake now complicated by acute COVID-[id number]) as evidenced by moderate fat depletion,moderate muscle depletion,severe muscle depletion.  GOAL:   Patient will meet greater than or equal to 90% of their needs -not met   MONITOR:   PO intake,Supplement acceptance,Labs,Weight trends,I & O's  ASSESSMENT:   79 yea17old male with PMHx of CAD, DM, HLD, HTN admitted with COVID-19.   Pt continues to have poor appetite and oral intake; pt eating <50% meals in hospital. Pt seen by SLP who recommended a dysphagia 2/nectar thick diet. Pt is being offered Nepro supplements. Per chart, pt is down ~24lbs(14%) since admit; this is severe weight loss. If family wishes to continue with full scope of care, recommend placement of a small bore nasogastric tube and nutrition support; this was discussed with MD. Pt is at high refeed risk.   Medications reviewed and include: plavix, ferrous sulfate, insulin, synthroid, MVI, senokot, NaCl '@50ml' /hr  Labs reviewed: BUN 48(H), creat 2.47(H), Mg 2.5(H) Wbc-  15.5(H), Hgb 10.0(L), Hct 31.0(L)  cbgs- 246, 275 x 24 hrs AIC 7.7(H)- 1/14  Diet Order:   Diet Order            DIET DYS 2 Room service appropriate? Yes with Assist; Fluid consistency: Nectar Thick  Diet effective now                EDUCATION NEEDS:   No education needs have been identified at this time  Skin:  Skin Assessment: Reviewed RN Assessment  Last BM:  12/10/2020 per chart  Height:   Ht Readings from Last 1 Encounters:  12/14/20 '5\' 11"'  (1.803 m)    Weight:   Wt Readings from Last 1 Encounters:  12/24/20 67.4 kg    BMI:  Body mass index is 20.71 kg/m.  Estimated Nutritional Needs:   Kcal:  2000-2300kcal/day  Protein:  100-115g/day  Fluid:  1.7-2.0L/day  Sadia Belfiore Koleen DistanceD, LDN Please refer to [facility] and/or RD on-call/weekend/after hours pager

## 2020-12-26 NOTE — Progress Notes (Addendum)
PROGRESS NOTE    Paul Parker  BJY:[id number] DOB: Aug 13, 1941 DOA: 12/11/2020 PCP: Paul Billings, NP   Chief complaint.  Shortness of breath Brief Narrative:  Paul Parker a 80 y.o.malemedical history significant forhypertension, hyperlipidemia, coronary artery disease, history of prostate cancer, hypothyroid, depression/anxiety, history of atrial fibrillation converted to sinus rhythm on amiodarone in2013/2014, presented to the emergency department for chief concerns of worsening shortness of breath. He was diagnosed with COVID-19 on 11/27/2020. He has received first and second dose of COVID-vaccine but has not received a booster yet. He was admitted to the hospital for YQMVH-84 pneumonia complicated by acute hypoxic respiratory failure.  2/1.  Oxygenation gradually improving.  Developed urinary retention, Foley catheter is anchored. Due to severe weight loss, poor appetite, will attempt to start tube feeding.   Assessment & Plan:   Active Problems:   Hypertension associated with diabetes ([place])   Acute hypoxemic respiratory failure due to COVID-19 [facility])   Prolonged QT interval   Hypernatremia   Atrial fibrillation (H[facility])   Severe protein-calorie malnutrition (HCC)   Malnutrition of moderate degree   Pneumonia due to COVID-19 virus  #1. Acute hypoxemic respiratory failure secondary to COVID-19 pneumonia. Multilobar pneumonia secondary to COVID-19 infection. Acute metabolic encephalopathy. Baseline dementia. Patient was weaned down to 8 L oxygen yesterday.  However, after family visit and PT, patient developed worsening hypoxemia, currently put back on 65% oxygen with high flow.  Improving. Evidence of bacterial pneumonia. No evidence of volume overload. He has completed steroids, remdesivir and baricitinib. I also requested speech therapist to see the patient for possible aspiration.  Patient and the family does not want patient to be on thickened liquid.  #2.  Severe  hyponatremia. Secondary to poor p.o. intake.  Sodium level much better.  3.  Acute kidney injury secondary to urinary retention. Urinary retention secondary to benign prostate hypertrophy. Patient was found to have residual over 400 mL, Foley catheter was anchored.  Recheck renal function tomorrow. Patient will need to follow-up with urology in 2 weeks to remove Foley catheter. Currently he does not have evidence of volume overload, also started some fluids.  4.  Paroxysmal atrial fibrillation. Elevated troponin secondary to Covid. Continue Eliquis.  5.  Uncontrolled type 2 diabetes with hyperglycemia alternating with hypoglycemia. Discontinue long-acting insulin, continue sliding scale insulin.  #6.  Severe protein calorie malnutrition. Anorexia. Patient has not been eating well since admission.  He has lost 20 pounds.  We will place the Dobb[place] and start tube feeding after discussion with patient Paul Parker.   Goal of care discussion: Patient condition is serious, he has lost 20 pounds while in the hospital.  His oxygen is worse again today since he was better yesterday.  And his long-term prognosis is still guarded.  Family decided to give patient a last chance and to try to start for tube feeding, if it does not improve, patient can be transitioned to hospice   DVT prophylaxis: Eliquis Code Status: Partial Family Communication: Paul Parker and Paul Parker updated.  Disposition Plan:  .   Status is: Inpatient  Remains inpatient appropriate because:Inpatient level of care appropriate due to severity of illness   Dispo: The patient is from: Home              Anticipated d/c is to: SNF              Anticipated d/c date is: > 3 days  Patient currently is not medically stable to d/c.   Difficult to place patient No        I/O last 3 completed shifts: In: 350 [P.O.:350] Out: 600 [Urine:600] Total I/O In: 240 [P.O.:240] Out: 0      Consultants:    None  Procedures: None  Antimicrobials:None  Subjective: Patient had a worsening hypoxemia yesterday after working with physical therapy and a family visit.  Currently he is on 65% oxygen.  He does not feel secondary short of breath.  His appetite is still poor, but no nausea vomiting abdominal pain. No fever chills. Had a signal urinary retention last night, anchored Foley catheter.  Objective: Vitals:   12/26/20 0335 12/26/20 0837 12/26/20 0921 12/26/20 1105  BP: 110/66  (!) 98/58 (!) 122/57  Pulse: (!) 57  76 73  Resp: 19  19 19   Temp: 97.9 F (36.6 C)  97.7 F (36.5 C) 97.8 F (36.6 C)  TempSrc: Oral  Oral Oral  SpO2: 95% 94% 95% 90%  Weight:      Height:        Intake/Output Summary (Last 24 hours) at 12/26/2020 1150 Last data filed at 12/26/2020 1020 Gross per 24 hour  Intake 340 ml  Output 500 ml  Net -160 ml   Filed Weights   12/21/20 0500 12/23/20 0500 12/24/20 0433  Weight: 70.1 kg 69.6 kg 67.4 kg    Examination:  General exam: Appears calm and comfortable  Respiratory system: Rhonchi in right base. Respiratory effort normal. Cardiovascular system: S1 & S2 heard, RRR. No JVD, murmurs, rubs, gallops or clicks. No pedal edema. Gastrointestinal system: Abdomen is nondistended, soft and nontender. No organomegaly or masses felt. Normal bowel sounds heard. Central nervous system: Alert and oriented x2. No focal neurological deficits. Extremities: Symmetric 5 x 5 power. Skin: No rashes, lesions or ulcers Psychiatry: Mood & affect appropriate.     Data Reviewed: I have personally reviewed following labs and imaging studies  CBC: Recent Labs  Lab 12/22/20 0657 12/23/20 0402 12/24/20 0535 12/25/20 0405 12/26/20 0453  WBC 12.2* 8.8 7.9 8.9 15.5*  NEUTROABS 10.5* 7.4 6.3 7.3 13.4*  HGB 10.5* 10.9* 11.3* 11.8* 10.0*  HCT 33.7* 34.4* 35.8* 37.3* 31.0*  MCV 99.1 98.0 98.6 97.9 98.1  PLT 182 187 187 200 161   Basic Metabolic Panel: Recent Labs  Lab  12/21/20 0644 12/22/20 0657 12/23/20 0402 12/24/20 0535 12/25/20 0405 12/26/20 0453  NA 146* 144 145  --  146* 145  K 4.2 4.2 4.2  --  4.1 5.1  CL 106 105 108  --  110 106  CO2 30 30 28   --  24 22  GLUCOSE 185* 130* 95  --  119* 239*  BUN 36* 28* 28*  --  30* 48*  CREATININE 1.06 1.03 0.81  --  1.08 2.47*  CALCIUM 8.6* 8.3* 8.5*  --  8.7* 8.6*  MG 2.1 2.0 2.1 2.2 2.3 2.5*   GFR: Estimated Creatinine Clearance: 23.1 mL/min (A) (by C-G formula based on SCr of 2.47 mg/dL (H)). Liver Function Tests: No results for input(s): AST, ALT, ALKPHOS, BILITOT, PROT, ALBUMIN in the last 168 hours. No results for input(s): LIPASE, AMYLASE in the last 168 hours. No results for input(s): AMMONIA in the last 168 hours. Coagulation Profile: No results for input(s): INR, PROTIME in the last 168 hours. Cardiac Enzymes: No results for input(s): CKTOTAL, CKMB, CKMBINDEX, TROPONINI in the last 168 hours. BNP (last 3 results) No results for input(s): PROBNP  in the last 8760 hours. HbA1C: No results for input(s): HGBA1C in the last 72 hours. CBG: Recent Labs  Lab 12/25/20 1220 12/25/20 1602 12/25/20 2101 12/26/20 0918 12/26/20 1141  GLUCAP 176* 196* 177* 246* 275*   Lipid Profile: No results for input(s): CHOL, HDL, LDLCALC, TRIG, CHOLHDL, LDLDIRECT in the last 72 hours. Thyroid Function Tests: No results for input(s): TSH, T4TOTAL, FREET4, T3FREE, THYROIDAB in the last 72 hours. Anemia Panel: No results for input(s): VITAMINB12, FOLATE, FERRITIN, TIBC, IRON, RETICCTPCT in the last 72 hours. Sepsis Labs: Recent Labs  Lab 12/20/20 0641 12/26/20 0453  PROCALCITON 0.11 0.20    No results found for this or any previous visit (from the past 240 hour(s)).       Radiology Studies: No results found.      Scheduled Meds: . amiodarone  100 mg Oral Daily  . apixaban  5 mg Oral BID  . atorvastatin  40 mg Oral Daily  . chlorpheniramine-HYDROcodone  5 mL Oral Q12H  . clopidogrel  75  mg Oral Daily  . DULoxetine  30 mg Oral Daily  . feeding supplement (NEPRO CARB STEADY)  237 mL Oral TID BM  . ferrous sulfate  325 mg Oral Q breakfast  . insulin aspart  0-15 Units Subcutaneous TID WC  . insulin aspart  0-5 Units Subcutaneous QHS  . levothyroxine  75 mcg Oral Q0600  . multivitamin with minerals  1 tablet Oral Daily  . senna-docusate  1 tablet Oral QHS   Continuous Infusions:   LOS: 19 days    Time spent: 38 minutes, more than 50% time spent direct patient care.    Sharen Hones, MD [facility]   To contact the attending provider between 7A-7P or the covering provider during after hours 7P-7A, please log into the web site [email or web address] and access using universal  password for that web site. If you do not have the password, please call the hospital operator.  12/26/2020, 11:50 AM

## 2020-12-26 DEATH — deceased

## 2020-12-27 DIAGNOSIS — E870 Hyperosmolality and hypernatremia: Secondary | ICD-10-CM | POA: Diagnosis not present

## 2020-12-27 DIAGNOSIS — U071 COVID-19: Secondary | ICD-10-CM | POA: Diagnosis not present

## 2020-12-27 DIAGNOSIS — Z7189 Other specified counseling: Secondary | ICD-10-CM | POA: Diagnosis not present

## 2020-12-27 DIAGNOSIS — E1159 Type 2 diabetes mellitus with other circulatory complications: Secondary | ICD-10-CM | POA: Diagnosis not present

## 2020-12-27 DIAGNOSIS — E440 Moderate protein-calorie malnutrition: Secondary | ICD-10-CM | POA: Diagnosis not present

## 2020-12-27 DIAGNOSIS — Z515 Encounter for palliative care: Secondary | ICD-10-CM | POA: Diagnosis not present

## 2020-12-27 LAB — CBC WITH DIFFERENTIAL/PLATELET
Abs Immature Granulocytes: 0.32 10*3/uL — ABNORMAL HIGH (ref 0.00–0.07)
Basophils Absolute: 0 10*3/uL (ref 0.0–0.1)
Basophils Relative: 0 %
Eosinophils Absolute: 0 10*3/uL (ref 0.0–0.5)
Eosinophils Relative: 0 %
HCT: 27.6 % — ABNORMAL LOW (ref 39.0–52.0)
Hemoglobin: 9.1 g/dL — ABNORMAL LOW (ref 13.0–17.0)
Immature Granulocytes: 2 %
Lymphocytes Relative: 7 %
Lymphs Abs: 1.4 10*3/uL (ref 0.7–4.0)
MCH: 32 pg (ref 26.0–34.0)
MCHC: 33 g/dL (ref 30.0–36.0)
MCV: 97.2 fL (ref 80.0–100.0)
Monocytes Absolute: 1.1 10*3/uL — ABNORMAL HIGH (ref 0.1–1.0)
Monocytes Relative: 6 %
Neutro Abs: 16.4 10*3/uL — ABNORMAL HIGH (ref 1.7–7.7)
Neutrophils Relative %: 85 %
Platelets: 214 10*3/uL (ref 150–400)
RBC: 2.84 MIL/uL — ABNORMAL LOW (ref 4.22–5.81)
RDW: 13.9 % (ref 11.5–15.5)
WBC: 19.2 10*3/uL — ABNORMAL HIGH (ref 4.0–10.5)
nRBC: 0.7 % — ABNORMAL HIGH (ref 0.0–0.2)

## 2020-12-27 LAB — BASIC METABOLIC PANEL
Anion gap: 15 (ref 5–15)
BUN: 75 mg/dL — ABNORMAL HIGH (ref 8–23)
CO2: 24 mmol/L (ref 22–32)
Calcium: 8.5 mg/dL — ABNORMAL LOW (ref 8.9–10.3)
Chloride: 108 mmol/L (ref 98–111)
Creatinine, Ser: 4.41 mg/dL — ABNORMAL HIGH (ref 0.61–1.24)
GFR, Estimated: 13 mL/min — ABNORMAL LOW (ref >60)
Glucose, Bld: 222 mg/dL — ABNORMAL HIGH (ref 70–99)
Potassium: 4.8 mmol/L (ref 3.5–5.1)
Sodium: 147 mmol/L — ABNORMAL HIGH (ref 135–145)

## 2020-12-27 LAB — MAGNESIUM: Magnesium: 2.8 mg/dL — ABNORMAL HIGH (ref 1.7–2.4)

## 2020-12-27 LAB — GLUCOSE, CAPILLARY
Glucose-Capillary: 209 mg/dL — ABNORMAL HIGH (ref 70–99)
Glucose-Capillary: 263 mg/dL — ABNORMAL HIGH (ref 70–99)

## 2020-12-27 MED ORDER — LORAZEPAM 2 MG/ML PO CONC
1.0000 mg | ORAL | Status: DC | PRN
  Administered 2020-12-27: 1 mg via SUBLINGUAL
  Filled 2020-12-27: qty 1

## 2020-12-27 MED ORDER — GLYCOPYRROLATE 0.2 MG/ML IJ SOLN
0.2000 mg | INTRAMUSCULAR | Status: DC | PRN
  Filled 2020-12-27: qty 1

## 2020-12-27 MED ORDER — ACETAMINOPHEN 325 MG PO TABS: 650.0000 mg | ORAL_TABLET | Freq: Four times a day (QID) | ORAL | Status: DC | PRN

## 2020-12-27 MED ORDER — ACETAMINOPHEN 650 MG RE SUPP: 650.0000 mg | Freq: Four times a day (QID) | RECTAL | Status: DC | PRN

## 2020-12-27 MED ORDER — ONDANSETRON HCL 4 MG/2ML IJ SOLN: 4.0000 mg | Freq: Four times a day (QID) | INTRAMUSCULAR | Status: DC | PRN

## 2020-12-27 MED ORDER — LORAZEPAM 2 MG/ML IJ SOLN
1.0000 mg | INTRAMUSCULAR | Status: DC | PRN
  Administered 2020-12-28 (×2): 1 mg via INTRAVENOUS
  Filled 2020-12-27 (×2): qty 1

## 2020-12-27 MED ORDER — HALOPERIDOL 0.5 MG PO TABS
0.5000 mg | ORAL_TABLET | ORAL | Status: DC | PRN
  Filled 2020-12-27: qty 1

## 2020-12-27 MED ORDER — BIOTENE DRY MOUTH MT LIQD: 15.0000 mL | OROMUCOSAL | Status: DC | PRN

## 2020-12-27 MED ORDER — LORAZEPAM 1 MG PO TABS: 1.0000 mg | ORAL_TABLET | ORAL | Status: DC | PRN

## 2020-12-27 MED ORDER — HALOPERIDOL LACTATE 5 MG/ML IJ SOLN: 0.5000 mg | INTRAMUSCULAR | Status: DC | PRN

## 2020-12-27 MED ORDER — POLYVINYL ALCOHOL 1.4 % OP SOLN
1.0000 [drp] | Freq: Four times a day (QID) | OPHTHALMIC | Status: DC | PRN
Start: 2020-12-27 — End: 2020-12-28
  Filled 2020-12-27: qty 15

## 2020-12-27 MED ORDER — GLYCOPYRROLATE 0.2 MG/ML IJ SOLN
0.2000 mg | INTRAMUSCULAR | Status: DC | PRN
  Administered 2020-12-27: 0.2 mg via INTRAVENOUS
  Filled 2020-12-27 (×2): qty 1

## 2020-12-27 MED ORDER — SODIUM CHLORIDE 0.9% FLUSH
3.0000 mL | Freq: Two times a day (BID) | INTRAVENOUS | Status: DC
  Administered 2020-12-27 (×2): 3 mL via INTRAVENOUS

## 2020-12-27 MED ORDER — SODIUM CHLORIDE 0.9% FLUSH
3.0000 mL | INTRAVENOUS | Status: DC | PRN
Start: 2020-12-27 — End: 2020-12-28

## 2020-12-27 MED ORDER — HALOPERIDOL LACTATE 2 MG/ML PO CONC
0.5000 mg | ORAL | Status: DC | PRN
  Filled 2020-12-27: qty 0.3

## 2020-12-27 MED ORDER — LACTATED RINGERS IV SOLN: INTRAVENOUS | Status: DC

## 2020-12-27 MED ORDER — GLYCOPYRROLATE 1 MG PO TABS
1.0000 mg | ORAL_TABLET | ORAL | Status: DC | PRN
  Filled 2020-12-27: qty 1

## 2020-12-27 MED ORDER — HYDROMORPHONE HCL 1 MG/ML IJ SOLN
0.3000 mg | INTRAMUSCULAR | Status: DC | PRN
  Administered 2020-12-27 – 2020-12-28 (×2): 0.5 mg via INTRAVENOUS
  Filled 2020-12-27 (×3): qty 1

## 2020-12-27 MED ORDER — ONDANSETRON 4 MG PO TBDP
4.0000 mg | ORAL_TABLET | Freq: Four times a day (QID) | ORAL | Status: DC | PRN
  Filled 2020-12-27: qty 1

## 2020-12-27 NOTE — Progress Notes (Signed)
Progress Note    Paul Parker  [id number] DOB: [birth year]  DOA: 12/19/2020 PCP: Jon Billings, NP      Brief Narrative:    Medical records reviewed and are as summarized below:  Paul Parker is a 80 y.o. male with medical history significant forhypertension, hyperlipidemia, coronary artery disease, history of prostate cancer, hypothyroid, depression/anxiety, history of atrial fibrillation converted to sinus rhythm on amiodarone in2013/2014, presented to the emergency department for chief concerns of worsening shortness of breath. He was diagnosed with COVID-19 on 11/27/2020.  He has received first and second dose of COVID-vaccine but has not received a booster yet.   He was admitted to the hospital for [id number] pneumonia complicated by acute hypoxic respiratory failure.  He was treated with cefepime and Cipro, steroids, Omnicef and baricitinib.  He also required high amount of oxygen via heated humidified high flow nasal cannula.      Assessment/Plan:   Active Problems:   Hypertension associated with diabetes ([place])   Acute hypoxemic respiratory failure due to COVID-19 (HCC)   Prolonged QT interval   Hypernatremia   Atrial fibrillation (HCC)   Malnutrition of moderate degree   Pneumonia due to COVID-19 virus   Nutrition Problem: Moderate Malnutrition Etiology: social / environmental circumstances (suspected inadequate oral intake now complicated by acute [id number] infection)  Signs/Symptoms: moderate fat depletion,moderate muscle depletion,severe muscle depletion   Body mass index is 20.45 kg/m.     COVID-19 pneumonia Severe acute hypoxic respiratory failure Acute kidney injury Paroxysmal atrial fibrillation Poor oral intake/moderate malnutrition CAD Hypertension Type 2 diabetes mellitus with hyperglycemia Hypernatremia Anxiety Depression Hypothyroidism   PLAN  He is not getting any better.  He remains on oxygen via heated humidified high flow nasal  cannula (FiO2 100% at 50 L/min). His creatinine is getting worse and has gone up from 2.47-4.4 in the last 24 hours. Rate of IV fluids was increased earlier today. Diagnosis and prognoses were discussed.  Family was informed that his prognosis is poor. Patient's wife and son have opted for comfort measures because of poor prognosis after further discussion with palliative care team. He has been transitioned to comfort care. Plan of care was discussed with patient, his son and wife at the bedside.   Diet Order            DIET - DYS 1 Room service appropriate? Yes; Fluid consistency: Nectar Thick  Diet effective now                    Consultants:  Palliative care team  Procedures:  None    Medications:   . chlorpheniramine-HYDROcodone  5 mL Oral Q12H  . senna-docusate  1 tablet Oral QHS  . sodium chloride flush  3 mL Intravenous Q12H   Continuous Infusions:   Anti-infectives (From admission, onward)   Start     Dose/Rate Route Frequency Ordered Stop   12/13/20 0800  cefdinir (OMNICEF) capsule 300 mg        300 mg Oral Every 12 hours 12/12/20 1146 12/16/20 2316   12/12/20 1800  cefdinir (OMNICEF) capsule 300 mg  Status:  Discontinued        300 mg Oral Every 12 hours 12/12/20 1135 12/12/20 1146   12/10/20 1000  cefTRIAXone (ROCEPHIN) 1 g in sodium chloride 0.9 % 100 mL IVPB  Status:  Discontinued        1 g 200 mL/hr over 30 Minutes Intravenous Every 24 hours 12/10/20 0911 12/12/20 1135  12/08/20 1000  remdesivir 100 mg in sodium chloride 0.9 % 100 mL IVPB       "Followed by" Linked Group Details   100 mg 200 mL/hr over 30 Minutes Intravenous Daily 12/19/2020 1338 12/11/20 1020   12/14/2020 2345  doxycycline (VIBRAMYCIN) 100 mg in sodium chloride 0.9 % 250 mL IVPB  Status:  Discontinued        100 mg 125 mL/hr over 120 Minutes Intravenous Every 12 hours 12/11/2020 2311 12/08/20 1053   11/27/2020 2315  azithromycin (ZITHROMAX) 500 mg in sodium chloride 0.9 % 250 mL IVPB   Status:  Discontinued        500 mg 250 mL/hr over 60 Minutes Intravenous Every 24 hours 12/20/2020 2307 12/21/2020 2311   12/02/2020 2315  cefTRIAXone (ROCEPHIN) 1 g in sodium chloride 0.9 % 100 mL IVPB  Status:  Discontinued        1 g 200 mL/hr over 30 Minutes Intravenous Every 24 hours 12/25/2020 2307 12/08/20 1053   12/01/2020 1500  remdesivir 200 mg in sodium chloride 0.9% 250 mL IVPB       "Followed by" Linked Group Details   200 mg 580 mL/hr over 30 Minutes Intravenous Once 12/12/2020 1338 12/03/2020 1455             Family Communication/Anticipated D/C date and plan/Code Status   DVT prophylaxis:      Code Status: DNR  Family Communication: His wife and son at the bedside Disposition Plan:    Status is: Inpatient  Remains inpatient appropriate because:Severe hypoxia   Dispo: The patient is from: Home              Anticipated d/c is to: Hospice house              Anticipated d/c date is: 1 day              Patient currently is not medically stable to d/c.   Difficult to place patient No           Subjective:   Interval events noted.  According to nursing staff, patient has pulled out his NG tube about 3 times.  He does not report any complaints.  His wife and son are at bedside.  Objective:    Vitals:   12/27/20 0421 12/27/20 0747 12/27/20 0805 12/27/20 1124  BP: 106/70 (!) 121/58  109/61  Pulse: 72 85  80  Resp: 17 18  16   Temp: 98.2 F (36.8 C) (!) 97.5 F (36.4 C)  97.9 F (36.6 C)  TempSrc:  Oral    SpO2: 93% 95% 93% 96%  Weight: 66.5 kg     Height:       No data found.   Intake/Output Summary (Last 24 hours) at 12/27/2020 1739 Last data filed at 12/27/2020 0600 Gross per 24 hour  Intake 100 ml  Output 50 ml  Net 50 ml   Filed Weights   12/23/20 0500 12/24/20 0433 12/27/20 0421  Weight: 69.6 kg 67.4 kg 66.5 kg    Exam:  GEN: NAD SKIN: Warm and dry EYES: No pallor or icterus ENT: MMM CV: RRR PULM: CTA B ABD: soft, ND, NT,  +BS CNS: AAO x 3, non focal EXT: No edema or tenderness   Data Reviewed:   I have personally reviewed following labs and imaging studies:  Labs: Labs show the following:   Basic Metabolic Panel: Recent Labs  Lab 12/22/20 0657 12/23/20 0402 12/24/20 0535 12/25/20 0405 12/26/20 0453 12/27/20  0452  NA 144 145  --  146* 145 147*  K 4.2 4.2  --  4.1 5.1 4.8  CL 105 108  --  110 106 108  CO2 30 28  --  24 22 24   GLUCOSE 130* 95  --  119* 239* 222*  BUN 28* 28*  --  30* 48* 75*  CREATININE 1.03 0.81  --  1.08 2.47* 4.41*  CALCIUM 8.3* 8.5*  --  8.7* 8.6* 8.5*  MG 2.0 2.1 2.2 2.3 2.5* 2.8*   GFR Estimated Creatinine Clearance: 12.8 mL/min (A) (by C-G formula based on SCr of 4.41 mg/dL (H)). Liver Function Tests: No results for input(s): AST, ALT, ALKPHOS, BILITOT, PROT, ALBUMIN in the last 168 hours. No results for input(s): LIPASE, AMYLASE in the last 168 hours. No results for input(s): AMMONIA in the last 168 hours. Coagulation profile No results for input(s): INR, PROTIME in the last 168 hours.  CBC: Recent Labs  Lab 12/23/20 0402 12/24/20 0535 12/25/20 0405 12/26/20 0453 12/27/20 0452  WBC 8.8 7.9 8.9 15.5* 19.2*  NEUTROABS 7.4 6.3 7.3 13.4* 16.4*  HGB 10.9* 11.3* 11.8* 10.0* 9.1*  HCT 34.4* 35.8* 37.3* 31.0* 27.6*  MCV 98.0 98.6 97.9 98.1 97.2  PLT 187 187 200 212 214   Cardiac Enzymes: No results for input(s): CKTOTAL, CKMB, CKMBINDEX, TROPONINI in the last 168 hours. BNP (last 3 results) No results for input(s): PROBNP in the last 8760 hours. CBG: Recent Labs  Lab 12/26/20 1141 12/26/20 1643 12/26/20 2001 12/27/20 0750 12/27/20 1125  GLUCAP 275* 260* 189* 209* 263*   D-Dimer: No results for input(s): DDIMER in the last 72 hours. Hgb A1c: No results for input(s): HGBA1C in the last 72 hours. Lipid Profile: No results for input(s): CHOL, HDL, LDLCALC, TRIG, CHOLHDL, LDLDIRECT in the last 72 hours. Thyroid function studies: No results for  input(s): TSH, T4TOTAL, T3FREE, THYROIDAB in the last 72 hours.  Invalid input(s): FREET3 Anemia work up: No results for input(s): VITAMINB12, FOLATE, FERRITIN, TIBC, IRON, RETICCTPCT in the last 72 hours. Sepsis Labs: Recent Labs  Lab 12/24/20 0535 12/25/20 0405 12/26/20 0453 12/27/20 0452  PROCALCITON  --   --  0.20  --   WBC 7.9 8.9 15.5* 19.2*    Microbiology No results found for this or any previous visit (from the past 240 hour(s)).  Procedures and diagnostic studies:  DG Abd 1 View  Result Date: 12/26/2020 CLINICAL DATA:  80 year old male with replacement of feeding tube. EXAM: ABDOMEN - 1 VIEW COMPARISON:  Earlier radiograph dated 12/26/2020. FINDINGS: Feeding tube with weighted tip at the level of the left hemidiaphragm. Recommend further advancing of the tube by additional 8 cm. Bilateral pulmonary densities as seen on the prior radiograph. Left pectoral pacemaker device. Median sternotomy wires and CABG vascular clips. No acute osseous pathology. Degenerative changes of the spine. IMPRESSION: Feeding tube with weighted tip at the level of the left hemidiaphragm. Recommend further advancing of the tube by additional 8 cm. Electronically Signed   By: Anner Crete M.D.   On: 12/26/2020 19:56   DG Abd 1 View  Result Date: 12/26/2020 CLINICAL DATA:  OG tube placement EXAM: ABDOMEN - 1 VIEW COMPARISON:  May 31, 2014 FINDINGS: The enteric tube projects over the gastric pylorus/antrum. There is a large amount of stool in the colon. The bowel gas pattern is nonspecific with gaseous distention of small bowel loops in the right lower quadrant. There is no definite pneumatosis or free air. There are degenerative  changes throughout the lumbar spine. IMPRESSION: 1. Enteric tube projects over the gastric pylorus/antrum. 2. Large amount of stool in the colon. 3. Gaseous distention of small bowel in the right lower quadrant. Electronically Signed   By: Constance Holster M.D.   On: 12/26/2020  16:40   US RENAL  Result Date: 12/26/2020 CLINICAL DATA:  Acute renal failure EXAM: RENAL / URINARY TRACT ULTRASOUND COMPLETE COMPARISON:  CT 09/02/2018 FINDINGS: Right Kidney: Renal measurements: 9.7 x 5.6 x 3.9 cm = volume: 112 mL. Echogenicity is within normal limits. No concerning renal mass, shadowing calculus or hydronephrosis. Left Kidney: Renal measurements: 9.8 x 5.1 x 4.7 cm = volume: 123 mL. Echogenicity is within normal limits. Within the interpolar left kidney is a 0.9 x 0.8 x 0.5 cm focus with some possible cystic component and more peripheral echogenic shadowing which could correspond to a minimally complex cystic focus seen on comparison CT 09/02/2018. No other concerning renal mass, shadowing calculus or hydronephrosis. Bladder: Poorly visualized sonographically due to decompression by Foley catheter. Other: None. IMPRESSION: 1. 9 mm focus in the left kidney with some peripheral echogenicity may reflect a complex cyst present on comparison CT 09/02/2018. Could consider further characterization with dedicated renal MR imaging on a nonemergent outpatient basis versus repeat sonographic imaging in 6 months. 2. Right kidney is unremarkable. 3. Urinary bladder poorly assessed due to decompression by Foley catheter. Electronically Signed   By: Lovena Le M.D.   On: 12/26/2020 19:40               LOS: 20 days   [facility]   Pager on [email or web address]. If 7PM-7AM, please contact night-coverage at [email or web address]     12/27/2020, 5:39 PM

## 2020-12-27 NOTE — Progress Notes (Signed)
Called [employer] and nothing needs to be done with pt pacemaker in regards to him being comfort care.  Pt resting comfortable with family at bedside  Removed tele box and o2 probe at the request of the family

## 2020-12-27 NOTE — Progress Notes (Signed)
PT Cancellation Note  Patient Details Name: Paul Parker MRN: [id number] DOB: April 25, 1941   Cancelled Treatment:     PT orders cancelled at this time, per chart pt transitioning to comfort care, PT to sign off.   Lieutenant Diego PT, DPT 1:41 PM,12/27/20

## 2020-12-27 NOTE — Progress Notes (Addendum)
Daily Progress Note   Patient Name: Paul Parker       Date: 12/27/2020 DOB: February 07, 1941  Age: 80 y.o. MRN#: [id number] Attending Physician: Jennye Boroughs, MD Primary Care Physician: Jon Billings, NP Admit Date: 12/20/2020  Reason for Consultation/Follow-up: Establishing goals of care  Subjective: Reviewed notes and labs. Decline in patient status noted as well as significant increase in O2 from 8 lpm to 99% by HFNC. Spoke with [place] as wife had previously instructed. Dawn states her husband and patient's wife are at bedside. She discusses family's desire for a feeding tube of which the patient has removed multiple times. She discusses that the patient has said he could see a beautiful place.   Went to bedside. Patient is resting in bed on his side. Wife and son are at bedside. Took them to chapel to speak as patient is unable to participate, and is in a semi private room, and other patient has a Counselling psychologist. We discussed patient's diagnoses and status. Discussed multiple different scenarios and concern for patient's QOL currently and at best in the future. They are aware of his very poor prospect of meaningful recovery, and state some of the family members have had dreams and overall anxiety about his dying. Son discusses that patient told him about a beautiful place he could see on another plane. They state they do not want to lose him, but do not want him to suffer; they discuss his wishes for Dr. Malachi Bonds and Coke and that he did not want it thickend. They also discuss his self removal of HFNC along with feeding tube. Discussed continuing agressive care vs shifting to comfort focused care in detail. Discussed his prognosis of likely hours if liberated from HFNC. They called patient's other son. Discussed his  status and son tells them "do what you need to do." They called other family members to discuss his status and plans, and I remained to answer questions.   Family would like to shift to full comfort care after family (who lives a few hours away) has been able to come to bedside and spend time with patient. They would like to provide comfort measures including medications and desired oral intake, but continue HFNC until family has seen him. They are aware his prognosis would possibly be hours if liberated from HFNC. Request made for  private room on behalf of family, and staff accommodated. Family grateful.   I completed a MOST form today with wife and son, and the signed original was placed in the chart. A photocopy was placed in the chart to be scanned into EMR. The patient outlined their wishes for the following treatment decisions:  Cardiopulmonary Resuscitation: Do Not Attempt Resuscitation (DNR/No CPR)  Medical Interventions: Comfort Measures: Keep clean, warm, and dry. Use medication by any route, positioning, wound care, and other measures to relieve pain and suffering. Use oxygen, suction and manual treatment of airway obstruction as needed for comfort. Do not transfer to the hospital unless comfort needs cannot be met in current location. Continue high flow cannula until family has visited then discontinue.   Antibiotics: No antibiotics (use other measures to relieve symptoms)  IV Fluids: No IV fluids (provide other measures to ensure comfort)  Feeding Tube: No feeding tube     Returned to bedside to check on patient. Multiple family members at bedside visiting. No distress noted. Per family, they have had to redirect him from removing his high flow cannula as they are waiting for additional family members to come.   Length of Stay: 20  Current Medications: Scheduled Meds:  . amiodarone  100 mg Oral Daily  . chlorpheniramine-HYDROcodone  5 mL Oral Q12H  . insulin aspart  0-15 Units  Subcutaneous TID WC  . insulin aspart  0-5 Units Subcutaneous QHS  . senna-docusate  1 tablet Oral QHS    Continuous Infusions:   PRN Meds: acetaminophen, guaiFENesin-dextromethorphan, ondansetron **OR** ondansetron (ZOFRAN) IV, polyethylene glycol  Physical Exam Pulmonary:     Comments: On HFNC.  Skin:    General: Skin is warm and dry.  Neurological:     Mental Status: He is alert.             Vital Signs: BP 109/61 (BP Location: Left Arm)   Pulse 80   Temp 97.9 F (36.6 C)   Resp 16   Ht '5\' 11"'  (1.803 m)   Wt 66.5 kg   SpO2 96%   BMI 20.45 kg/m  SpO2: SpO2: 96 % O2 Device: O2 Device: High Flow Nasal Cannula O2 Flow Rate: O2 Flow Rate (L/min): 50 L/min  Intake/output summary:   Intake/Output Summary (Last 24 hours) at 12/27/2020 1309 Last data filed at 12/27/2020 0600 Gross per 24 hour  Intake 100 ml  Output 50 ml  Net 50 ml   LBM: Last BM Date: 12/09/20 Baseline Weight: Weight: 79.4 kg Most recent weight: Weight: 66.5 kg       Flowsheet Rows   Flowsheet Row Most Recent Value  Intake Tab   Referral Department Hospitalist  Unit at Time of Referral ER  Palliative Care Primary Diagnosis Sepsis/Infectious Disease  Date Notified 12/11/20  Palliative Care Type New Palliative care  Reason for referral Clarify Goals of Care  Date of Admission 12/23/2020  Date first seen by Palliative Care 12/11/20  # of days Palliative referral response time 0 Day(s)  # of days IP prior to Palliative referral 4  Clinical Assessment   Psychosocial & Spiritual Assessment   Palliative Care Outcomes       Patient Active Problem List   Diagnosis Date Noted  . Pneumonia due to COVID-19 virus 12/16/2020  . Aortic atherosclerosis ([place]) 12/15/2020  . History of concussion 12/15/2020  . Hyperlipidemia associated with type 2 diabetes mellitus ([place]) 12/15/2020  . Malnutrition of moderate degree 12/13/2020  . Hypernatremia   . Atrial  fibrillation ([place])   . Severe protein-calorie  malnutrition ([place])   . Swelling of lower leg   . Acute hypoxemic respiratory failure due to COVID-19 ([place]) 12/18/2020  . Prolonged QT interval 12/09/2020  . SSS (sick sinus syndrome) ([place]) 04/05/2020  . 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 Memory changes 12/02/2019  . Recurrent major depressive disorder, in full remission ([place]) 09/30/2018  . Anemia 09/30/2018  . Advanced care planning/counseling discussion 10/28/2017  . Senile purpura ([place]) 08/30/2015  . CAD (coronary artery disease) 08/30/2015  . BPH (benign prostatic hyperplasia) 08/30/2015  . Hypothyroidism 08/30/2015  . Type 2 diabetes mellitus without obesity ([place])   . Hypertension associated with diabetes [facility])     Palliative Care Assessment & Plan    Recommendations/Plan:  Full comfort care and liberation from HFNC once family has been able to visit with patient. For now, comfort measures, and medications for comfort, but continue HFNC.    Code Status:    Code Status Orders  (From admission, onward)         Start     Ordered   12/27/20 1300  Do not attempt resuscitation (DNR)  Continuous       Question Answer Comment  In the event of cardiac or respiratory ARREST Do not call a "code blue"   In the event of cardiac or respiratory ARREST Do not perform Intubation, CPR, defibrillation or ACLS   In the event of cardiac or respiratory ARREST Use medication by any route, position, wound care, and other measures to relive pain and suffering. May use oxygen, suction and manual treatment of airway obstruction as needed for comfort.   Comments New MOST form in [place]      12/27/20 1259        Code Status History    Date Active Date Inactive Code Status Order ID Comments User Context   12/15/2020 1427 12/27/2020 1259 Partial Code [id number]  Asencion Gowda, NP Inpatient   12/12/2020 1544 12/15/2020 1427 Partial Code [id number]  Asencion Gowda, NP Inpatient   12/24/2020 7416 12/12/2020 1544 Full Code [id number]  Cox[facility]riant Cedar,  DO ED   Advance Care Planning Activity       Prognosis:   Hours - Days  Discharge Planning:  Anticipated Hospital Death  Care plan was discussed with RN, charge RN, [facility].   Thank you for allowing the Palliative Medicine Team to assist in the care of this patient.   Time In: 11:30 Time Out: 12:40 Total Time 70 min Prolonged Time Billed no      Greater than 50%  of this time was spent counseling and coordinating care related to the above assessment and plan.  Asencion Gowda, NP  Please contact Palliative Medicine Team phone at [phone] for questions and concerns.

## 2020-12-27 NOTE — Progress Notes (Signed)
Occupational Therapy Treatment Patient Details Name: Paul Parker MRN: [id number] DOB: [birth year] Today's Date: 12/27/2020    History of present illness presented to ER secondary to progressive SOB; admitted for management of acute hypoxic/hypoxemic respiratory failure due to COVID-19   OT comments  Mr. Couser was seen for OT/PT co-treatment on this date. RN cleared pt for therapy co-tx prior to session. Pt received on heated high flow 50L 99% fiO2 t/o session. Vitals remain generally WFL with spO2 intermittently dropping to mid 83-86%. HR noted to reach low 100's during session. RN notified/aware of pt vitals at end of session. OT/PT facilitated bed mobility with education on energy conservation strategies including pursed lip breathing and positional strategies to maximize lung capacity and function this date. Pt fatigues quickly and endorses dizziness upon coming to sit at EOB. Maintains sitting balance with close CGA for safety for <30 seconds x2 during session. He is unable to maintain upright positioning to participate in functional tasks while seated EOB. He requires Min +2 for rolling side to side in bed, as well as MAX A for bed-level peri care. See ADL section below for additional details regarding occupational performance. Pt progressing toward OT goals and continues to benefit from skilled OT services to maximize return to PLOF and minimize risk of future falls, injury, caregiver burden, and readmission. Will continue to follow POC as written. Discharge recommendation remains appropriate.    Follow Up Recommendations  SNF;Supervision/Assistance - 24 hour    Equipment Recommendations       Recommendations for Other Services      Precautions / Restrictions Precautions Precautions: Fall Restrictions Weight Bearing Restrictions: No       Mobility Bed Mobility Overal bed mobility: Needs Assistance Bed Mobility: Supine to Sit;Sit to Supine;Rolling Rolling: Min assist   Supine to sit:  Min assist;+2 for physical assistance Sit to supine: +2 for physical assistance;Max assist   General bed mobility comments: MAX A for boost up in bed. Pt noted with tele-monitor reading V-tach intermittently during session. Further mobility deferred for pt safety. RN notified.  Transfers                      Balance Overall balance assessment: Needs assistance Sitting-balance support: Bilateral upper extremity supported;Feet supported Sitting balance-Leahy Scale: Fair Sitting balance - Comments: Pt able to achieve upright posure with cueing. He demos fair balance with weight shift while scooting EOB but requires close CGA during sitting tasks 2/2 poor activity tolerance.                                   ADL either performed or assessed with clinical judgement   ADL Overall ADL's : Needs assistance/impaired Eating/Feeding: Moderate assistance;Maximal assistance;Bed level Eating/Feeding Details (indicate cue type and reason): Pt on modified diet (Dysphagia II with nectar thick liquids). He continues to require MOD/MAX A for self-feeding at bed level as he is unable to tolerate extended sitting. Grooming: Oral care;Minimal assistance;Bed level Grooming Details (indicate cue type and reason): Min A using oral care sponges. Pt able to progress to supervision, requires cueing to discontinue task after several minutes. Goal for session was to perform oral care while seated EOB, however, pt was unable to tolerate sitting upright for >20-30 seconds at a time.                   Toilet Transfer Details (indicate cue type and  reason): Bed level for toileting. Toileting- Clothing Manipulation and Hygiene: Bed level Toileting - Clothing Manipulation Details (indicate cue type and reason): Min A to roll side to side in bed, but MAX-TOTAL A to perform peri hygiene after pt noted to have sheets soiled with BM.             Vision Baseline Vision/History: Wears  glasses Patient Visual Report: No change from baseline     Perception     Praxis      Cognition Arousal/Alertness: Awake/alert;Lethargic (Pt generally keeps eyes closed, is able to respond appropriately to questions/VCs during session.) Behavior During Therapy: Flat affect;Anxious Overall Cognitive Status: No family/caregiver present to determine baseline cognitive functioning                                          Exercises Other Exercises Other Exercises: OT facilitated bed level oral care, edu regarding energy conservation strategies including pursed lip breathing, positional strategies to maximize lung function, and activity pacing. OT/PT facilitate bed mobility, sup<>sit x2, rolling in bed, linen change, peri-care, and positioning in bed with time to monitor vitals and assess pt response to activity t/o session.   Shoulder Instructions       General Comments Pt on heated high flow 50L 99% fiO2 t/o session. Vitals remain generally WFL with spO2 intermittently dropping to mid 83-86%, HR noted to reach low 100's during session. RN notified/aware.    Pertinent Vitals/ Pain       Pain Score: 0-No pain  Home Living                                          Prior Functioning/Environment              Frequency  Min 1X/week        Progress Toward Goals  OT Goals(current goals can now be found in the care plan section)  Progress towards OT goals: Progressing toward goals  Acute Rehab OT Goals Patient Stated Goal: to get stronger OT Goal Formulation: With patient Time For Goal Achievement: 01/06/21 Potential to Achieve Goals: [place] Discharge plan remains appropriate;Frequency remains appropriate    Co-evaluation    PT/OT/SLP Co-Evaluation/Treatment: Yes Reason for Co-Treatment: Complexity of the patient's impairments (multi-system involvement);Necessary to address cognition/behavior during functional activity;For  patient/therapist safety;To address functional/ADL transfers PT goals addressed during session: Mobility/safety with mobility;Balance OT goals addressed during session: ADL's and self-care      AM-PAC OT "6 Clicks" Daily Activity     Outcome Measure   Help from another person eating meals?: A Little Help from another person taking care of personal grooming?: A Lot Help from another person toileting, which includes using toliet, bedpan, or urinal?: Total Help from another person bathing (including washing, rinsing, drying)?: A Lot Help from another person to put on and taking off regular upper body clothing?: A Lot Help from another person to put on and taking off regular lower body clothing?: A Lot 6 Click Score: 12    End of Session Equipment Utilized During Treatment: Oxygen  OT Visit Diagnosis: Unsteadiness on feet (R26.81);Other abnormalities of gait and mobility (R26.89)   Activity Tolerance Patient tolerated treatment well   Patient Left in bed;with call bell/phone within reach;with bed alarm set  Nurse Communication          Time: [id number] OT Time Calculation (min): 40 min  Charges: OT General Charges $OT Visit: 1 Visit OT Treatments $Self Care/Home Management : 8-22 mins $Therapeutic Activity: 8-22 mins  Shara Blazing, M.S., OTR/L Ascom: [phone] 12/27/20, 1:14 PM

## 2020-12-27 NOTE — Progress Notes (Signed)
Inpatient Diabetes Program Recommendations  AACE/ADA: New Consensus Statement on Inpatient Glycemic Control (2015)  Target Ranges:  Prepandial:   less than 140 mg/dL      Peak postprandial:   less than 180 mg/dL (1-2 hours)      Critically ill patients:  140 - 180 mg/dL   Results for [facility] (MRN [id number]) as of 12/27/2020 08:53  Ref. Range 12/26/2020 09:18 12/26/2020 11:41 12/26/2020 16:43 12/26/2020 20:01  Glucose-Capillary Latest Ref Range: 70 - 99 mg/dL 246 (H) 275 (H) 260 (H) 189 (H)   Results for [facility] (MRN [id number]) as of 12/27/2020 08:53  Ref. Range 12/27/2020 07:50  Glucose-Capillary Latest Ref Range: 70 - 99 mg/dL 209 (H)   Home DM Meds: Metformin 500 mg BID                             Actos 45 mg daily  Current Orders: Novolog 0-15 units TID ac/hs   Severe HYPO AM of 01/31 (CBG 45)--Got 10 units Lantus the day prior--Lantus stopped.   No Lantus given since 01/30  CBGs now >200   MD- Please consider adding back small amount of basal insulin:  Lantus 5 units Daily     --Will follow patient during hospitalization--  Wyn Quaker RN, MSN, CDE Diabetes Coordinator Inpatient Glycemic Control Team Team Pager: [phone] (8a-5p)

## 2020-12-27 NOTE — Progress Notes (Signed)
Physical Therapy Treatment Patient Details Name: Paul Parker MRN: [id number] DOB: 09/28/1941 Today's Date: 12/27/2020    History of Present Illness presented to ER secondary to progressive SOB; admitted for management of acute hypoxic/hypoxemic respiratory failure due to COVID-19    PT Comments    Pt seen with OT to maximize mobility, pt/therapist safety, RN consented to PT/OT session. Pt denied pain, on 50L with fiO2 at 99% throughout. Pt verbalized 1-2 words at a time, soft spoken and eyes closed through majority of session but able to follow all commands. Supine <> sit twice during session minAx2 to EOB, ultimately maxA to return safely, first attempt pt reported dizziness and wanted to return to supin ~10seconds into sitting. Extended rest break given and education on PLB. Second attempt pt able to sit EOB a bit longer (~30seconds), but exhibited fatigue, tremulous, very slouched posture. Returned to supine. After rest, pt performed rolling L and R minA with bed rails for pericare due to BM noted. Pt left in R sidelying for his comfort, spO2 readings 93%. The patient would benefit from further skilled PT intervention to continue to progress towards goals. Recommendation remains appropriate.        Follow Up Recommendations  SNF     Equipment Recommendations  Other (comment) (to  be determinted at next level of care)    Recommendations for Other Services       Precautions / Restrictions Precautions Precautions: Fall Restrictions Weight Bearing Restrictions: No    Mobility  Bed Mobility Overal bed mobility: Needs Assistance Bed Mobility: Supine to Sit;Sit to Supine;Rolling Rolling: Min assist   Supine to sit: Min assist;+2 for physical assistance Sit to supine: +2 for physical assistance;Max assist   General bed mobility comments: MAX A for boost up in bed. Pt noted with tele-monitor reading V-tach intermittently during session. Further mobility deferred for pt safety. RN  notified.  Transfers                 General transfer comment: deferred due to safety concerns  Ambulation/Gait                 Stairs             Wheelchair Mobility    Modified Rankin (Stroke Patients Only)       Balance Overall balance assessment: Needs assistance Sitting-balance support: Bilateral upper extremity supported;Feet supported Sitting balance-Leahy Scale: Poor Sitting balance - Comments: Pt able to achieve upright posure with cueing. He demos fair balance with weight shift while scooting EOB but requires close CGA during sitting tasks 2/2 poor activity tolerance. reliant on UE support in sitting, much more fatigued and slouched posture throughout. Pt stated he was dizzy as well.       Standing balance comment: unable this session                            Cognition Arousal/Alertness: Awake/alert (eyes closed majority of session) Behavior During Therapy: Flat affect;Anxious Overall Cognitive Status: No family/caregiver present to determine baseline cognitive functioning                                        Exercises Other Exercises Other Exercises: OT facilitated bed level oral care, edu regarding energy conservation strategies including pursed lip breathing, positional strategies to maximize lung function, and activity pacing. OT/PT facilitate bed  mobility, sup<>sit x2, rolling in bed, linen change, peri-care, and positioning in bed with time to monitor vitals and assess pt response to activity t/o session.    General Comments General comments (skin integrity, edema, etc.): H[facility]NC 50L at 99% FiO2      Pertinent Vitals/Pain Pain Assessment: No/denies pain Pain Score: 0-No pain    Home Living                      Prior Function            PT Goals (current goals can now be found in the care plan section) Acute Rehab PT Goals Patient Stated Goal: to get stronger Progress towards PT goals:  Progressing toward goals    Frequency    Min 2X/week      PT Plan Current plan remains appropriate    Co-evaluation PT/OT/SLP Co-Evaluation/Treatment: Yes Reason for Co-Treatment: Necessary to address cognition/behavior during functional activity;For patient/therapist safety;To address functional/ADL transfers PT goals addressed during session: Mobility/safety with mobility;Balance OT goals addressed during session: ADL's and self-care      AM-PAC PT "6 Clicks" Mobility   Outcome Measure  Help needed turning from your back to your side while in a flat bed without using bedrails?: A Little Help needed moving from lying on your back to sitting on the side of a flat bed without using bedrails?: A Little Help needed moving to and from a bed to a chair (including a wheelchair)?: A Lot Help needed standing up from a chair using your arms (e.g., wheelchair or bedside chair)?: A Lot Help needed to walk in hospital room?: A Lot Help needed climbing 3-5 steps with a railing? : A Lot 6 Click Score: 14    End of Session Equipment Utilized During Treatment: Oxygen (50L at 99% fiO2) Activity Tolerance: Patient limited by fatigue Patient left: in bed;with call bell/phone within reach;with bed alarm set Nurse Communication: Mobility status PT Visit Diagnosis: Muscle weakness (generalized) (M62.81);Difficulty in walking, not elsewhere classified (R26.2)     Time: 2353-6144 PT Time Calculation (min) (ACUTE ONLY): 35 min  Charges:  $Therapeutic Exercise: 8-22 mins                    Lieutenant Diego PT, DPT 1:39 PM,12/27/20

## 2020-12-27 NOTE — Progress Notes (Signed)
OT Cancellation Note  Patient Details Name: Dmonte Maher MRN: [id number] DOB: 12-02-1940   Cancelled Treatment:    Reason Eval/Treat Not Completed: Other (comment). Mr. Boswell noted to be on comfort care. Acknowledge discontinue of OT order. It was a pleasure to participate in this pt's care. Please re-consult of GOC change or new OT needs arise.   Shara Blazing, M.S., OTR/L Ascom: [phone] 12/27/20, 1:17 PM

## 2021-01-02 ENCOUNTER — Ambulatory Visit: Payer: Medicare Other | Admitting: Urology

## 2021-01-10 LAB — BLOOD GAS, VENOUS
Acid-Base Excess: 0.4 mmol/L (ref 0.0–2.0)
Bicarbonate: 26 mmol/L (ref 20.0–28.0)
O2 Saturation: 55 %
Patient temperature: 37
pCO2, Ven: 45 mmHg (ref 44.0–60.0)
pH, Ven: 7.37 (ref 7.250–7.430)

## 2021-01-23 NOTE — Plan of Care (Signed)
PMT note:  Arrived to bedside, staff was working with patient as he died per staff around shift change. Called Dawn's phone to offer support to family and offer condolences. She states she and her husband were at bedside through the night and upon his death. She states he had a peaceful transition and family is grateful to hospital for his care.   No charge note.

## 2021-01-23 NOTE — Death Summary Note (Addendum)
DEATH SUMMARY   Patient Details  Name: Paul Parker MRN: [id number] DOB: 1941/02/07  Admission/Discharge Information   Admit Date:  26-Dec-2020  Date of Death:  01/16/2021  Time of Death:  6:55 AM  Length of Stay: 21  Referring Physician: Jon Billings, NP   Reason(s) for Hospitalization  Shortness of breath  Diagnoses  Preliminary cause of death:  Secondary Diagnoses (including complications and co-morbidities):  Principal Problem:   Pneumonia due to COVID-19 virus Active Problems:   Hypertension associated with diabetes ([place])   Acute hypoxemic respiratory failure due to COVID-19 [facility])   Prolonged QT interval   Hypernatremia   Atrial fibrillation (HCC)   Malnutrition of moderate degree   Brief Hospital Course (including significant findings, care, treatment, and services provided and events leading to death)  Paul Parker is a 80 y.o. year old male withmedical history significant forhypertension, hyperlipidemia, coronary artery disease, history of prostate cancer, hypothyroid, depression/anxiety, history of atrial fibrillation converted to sinus rhythm on amiodarone in2013/2014, who presented to the emergency department for chief concerns of worsening shortness of breath. He was diagnosed with COVID-19 on 11/27/2020. He had received first and second dose of COVID-vaccine but had not received a booster yet.   He was admitted to the hospital for [id number] pneumonia complicated by acute hypoxic respiratory failure.He was treated with antibiotics, steroids,  and baricitinib. He also required 100% FiO2 of oxygen via heated humidified high flow nasal cannula for severe acute hypoxic respiratory failure.  He had poor oral intake and moderate protein calorie malnutrition.  NG tube had been placed for enteral nutrition but he pulled out NG tube multiple times.  He developed acute kidney injury and hypenatremia and he was treated with IV fluids.  Unfortunately, his condition was not  improving.  Palliative care team was consulted to assist with goals of care. His family opted for comfort measures.  His condition deteriorated and he expired on 01-16-21 at 6:55 AM.     Pertinent Labs and Studies  Significant Diagnostic Studies DG Chest 1 View  Result Date: 12/20/2020 CLINICAL DATA:  Acute respiratory failure.  COVID. EXAM: CHEST  1 VIEW COMPARISON:  CT 2020/12/26.  Chest x-ray 12-26-2020. FINDINGS: Cardiac pacer with lead tips over the right atrium right ventricle. Prior CABG. Stable cardiomegaly. Postsurgical changes left lung. Diffuse severe bilateral pulmonary infiltrates/edema again noted. IMPRESSION: 1. Cardiac pacer with lead tips over the right atrium right ventricle. Prior CABG. Stable cardiomegaly. 2. Diffuse severe bilateral pulmonary infiltrates/edema again noted. Findings again consistent with COVID pneumonia. No interim change. Electronically Signed   By: Marcello Moores  Register   On: 12/20/2020 12:46   DG Abd 1 View  Result Date: 12/26/2020 CLINICAL DATA:  80 year old male with replacement of feeding tube. EXAM: ABDOMEN - 1 VIEW COMPARISON:  Earlier radiograph dated 12/26/2020. FINDINGS: Feeding tube with weighted tip at the level of the left hemidiaphragm. Recommend further advancing of the tube by additional 8 cm. Bilateral pulmonary densities as seen on the prior radiograph. Left pectoral pacemaker device. Median sternotomy wires and CABG vascular clips. No acute osseous pathology. Degenerative changes of the spine. IMPRESSION: Feeding tube with weighted tip at the level of the left hemidiaphragm. Recommend further advancing of the tube by additional 8 cm. Electronically Signed   By: Anner Crete M.D.   On: 12/26/2020 19:56   DG Abd 1 View  Result Date: 12/26/2020 CLINICAL DATA:  OG tube placement EXAM: ABDOMEN - 1 VIEW COMPARISON:  May 31, 2014 FINDINGS: The enteric tube projects  over the gastric pylorus/antrum. There is a large amount of stool in the colon. The  bowel gas pattern is nonspecific with gaseous distention of small bowel loops in the right lower quadrant. There is no definite pneumatosis or free air. There are degenerative changes throughout the lumbar spine. IMPRESSION: 1. Enteric tube projects over the gastric pylorus/antrum. 2. Large amount of stool in the colon. 3. Gaseous distention of small bowel in the right lower quadrant. Electronically Signed   By: Constance Holster M.D.   On: 12/26/2020 16:40   CT Angio Chest PE W and/or Wo Contrast  Result Date: 12/15/2020 CLINICAL DATA:  PE suspected, high prob COVID pneumonia.  Shortness of breath. EXAM: CT ANGIOGRAPHY CHEST WITH CONTRAST TECHNIQUE: Multidetector CT imaging of the chest was performed using the standard protocol during bolus administration of intravenous contrast. Multiplanar CT image reconstructions and MIPs were obtained to evaluate the vascular anatomy. CONTRAST:  14mL OMNIPAQUE IOHEXOL 350 MG/ML SOLN COMPARISON:  Radiograph earlier today. FINDINGS: Cardiovascular: There are no filling defects within the pulmonary arteries to suggest pulmonary embolus. Subsegmental branches are not well assessed given breathing motion artifact. Left-sided pacemaker in place with leads in the right atrium and ventricle. Multi chamber cardiomegaly post CABG. Dense calcification of native coronary arteries. No pericardial effusion. Contrast refluxes into the hepatic veins and IVC. Aortic atherosclerosis and tortuosity. No aortic aneurysm. Conventional branching pattern from the aortic arch, partially obscured by dense IV contrast in the adjacent venous structures. Mediastinum/Nodes: No enlarged mediastinal lymph nodes. 12 mm right hilar node is likely reactive. Patulous esophagus without wall thickening. Small hiatal hernia. No visualized nodule. Lungs/Pleura: Multifocal patchy geographic ground-glass opacities throughout both lungs. There is dense consolidation throughout much of the right lower lobe. Areas of  confluent consolidation also seen in the left lower lobe. Trachea and central bronchi are patent without intraluminal debris. No septal thickening to suggest pulmonary edema. Upper Abdomen: Contrast refluxing into the hepatic veins and IVC. Calcified gallstones without acute gallbladder findings. Musculoskeletal: Median sternotomy.  Diffuse thoracic spondylosis. Review of the MIP images confirms the above findings. IMPRESSION: 1. No pulmonary embolus. 2. Multifocal COVID pneumonia, greatest in the right lower lobe. Parenchymal involvement is moderate to advanced. 3. Multi chamber cardiomegaly post CABG. Contrast refluxing into the hepatic veins and IVC consistent with elevated right heart pressures. 4. Incidental cholelithiasis. Aortic Atherosclerosis (ICD10-I70.0). Electronically Signed   By: Keith Rake M.D.   On: 11/25/2020 16:15   US RENAL  Result Date: 12/26/2020 CLINICAL DATA:  Acute renal failure EXAM: RENAL / URINARY TRACT ULTRASOUND COMPLETE COMPARISON:  CT 09/02/2018 FINDINGS: Right Kidney: Renal measurements: 9.7 x 5.6 x 3.9 cm = volume: 112 mL. Echogenicity is within normal limits. No concerning renal mass, shadowing calculus or hydronephrosis. Left Kidney: Renal measurements: 9.8 x 5.1 x 4.7 cm = volume: 123 mL. Echogenicity is within normal limits. Within the interpolar left kidney is a 0.9 x 0.8 x 0.5 cm focus with some possible cystic component and more peripheral echogenic shadowing which could correspond to a minimally complex cystic focus seen on comparison CT 09/02/2018. No other concerning renal mass, shadowing calculus or hydronephrosis. Bladder: Poorly visualized sonographically due to decompression by Foley catheter. Other: None. IMPRESSION: 1. 9 mm focus in the left kidney with some peripheral echogenicity may reflect a complex cyst present on comparison CT 09/02/2018. Could consider further characterization with dedicated renal MR imaging on a nonemergent outpatient basis versus  repeat sonographic imaging in 6 months. 2. Right kidney is unremarkable.  3. Urinary bladder poorly assessed due to decompression by Foley catheter. Electronically Signed   By: Lovena Le M.D.   On: 12/26/2020 19:40   US Venous Img Lower Bilateral (DVT)  Result Date: 12/01/2020 CLINICAL DATA:  Shortness of breath EXAM: BILATERAL LOWER EXTREMITY VENOUS DOPPLER ULTRASOUND TECHNIQUE: Gray-scale sonography with compression, as well as color and duplex ultrasound, were performed to evaluate the deep venous system(s) from the level of the common femoral vein through the popliteal and proximal calf veins. COMPARISON:  None. FINDINGS: VENOUS Normal compressibility of the common femoral, superficial femoral, and popliteal veins. The calf veins were suboptimally visualized. Visualized portions of profunda femoral vein and great saphenous vein unremarkable. No filling defects to suggest DVT on grayscale or color Doppler imaging. Doppler waveforms show normal direction of venous flow, normal respiratory plasticity and response to augmentation. OTHER None. Limitations: none IMPRESSION: Negative. Electronically Signed   By: Constance Holster M.D.   On: 12/14/2020 17:05   DG Chest Port 1 View  Result Date: 12/16/2020 CLINICAL DATA:  Short of breath.  COVID positive EXAM: PORTABLE CHEST 1 VIEW COMPARISON:  08/07/2019 FINDINGS: Moderate bilateral patchy airspace disease is new since the prior study. Probable COVID pneumonia. Postop CABG. Dual lead pacemaker. Cardiac enlargement. No significant effusion. IMPRESSION: Diffuse patchy bilateral airspace disease most compatible with COVID pneumonia. Electronically Signed   By: Franchot Gallo M.D.   On: 12/04/2020 12:43    Microbiology No results found for this or any previous visit (from the past 240 hour(s)).  Lab Basic Metabolic Panel: Recent Labs  Lab 12/22/20 0657 12/23/20 0402 12/24/20 0535 12/25/20 0405 12/26/20 0453 12/27/20 0452  NA 144 145  --  146* 145  147*  K 4.2 4.2  --  4.1 5.1 4.8  CL 105 108  --  110 106 108  CO2 30 28  --  24 22 24   GLUCOSE 130* 95  --  119* 239* 222*  BUN 28* 28*  --  30* 48* 75*  CREATININE 1.03 0.81  --  1.08 2.47* 4.41*  CALCIUM 8.3* 8.5*  --  8.7* 8.6* 8.5*  MG 2.0 2.1 2.2 2.3 2.5* 2.8*   Liver Function Tests: No results for input(s): AST, ALT, ALKPHOS, BILITOT, PROT, ALBUMIN in the last 168 hours. No results for input(s): LIPASE, AMYLASE in the last 168 hours. No results for input(s): AMMONIA in the last 168 hours. CBC: Recent Labs  Lab 12/23/20 0402 12/24/20 0535 12/25/20 0405 12/26/20 0453 12/27/20 0452  WBC 8.8 7.9 8.9 15.5* 19.2*  NEUTROABS 7.4 6.3 7.3 13.4* 16.4*  HGB 10.9* 11.3* 11.8* 10.0* 9.1*  HCT 34.4* 35.8* 37.3* 31.0* 27.6*  MCV 98.0 98.6 97.9 98.1 97.2  PLT 187 187 200 212 214   Cardiac Enzymes: No results for input(s): CKTOTAL, CKMB, CKMBINDEX, TROPONINI in the last 168 hours. Sepsis Labs: Recent Labs  Lab 12/24/20 0535 12/25/20 0405 12/26/20 0453 12/27/20 0452  PROCALCITON  --   --  0.20  --   WBC 7.9 8.9 15.5* 19.2*    Procedures/Operations  None   Marcee Jacobs Jan 11, 2021, 4:48 PM

## 2021-01-23 DEATH — deceased

## 2021-05-22 IMAGING — DX DG CHEST 1V PORT
1 series · 1 of 1 positions shown · non-contrast
Comparison: 08/07/2019

CLINICAL DATA: Short of breath.  COVID positive

EXAM:
PORTABLE CHEST 1 VIEW

[chest ap]
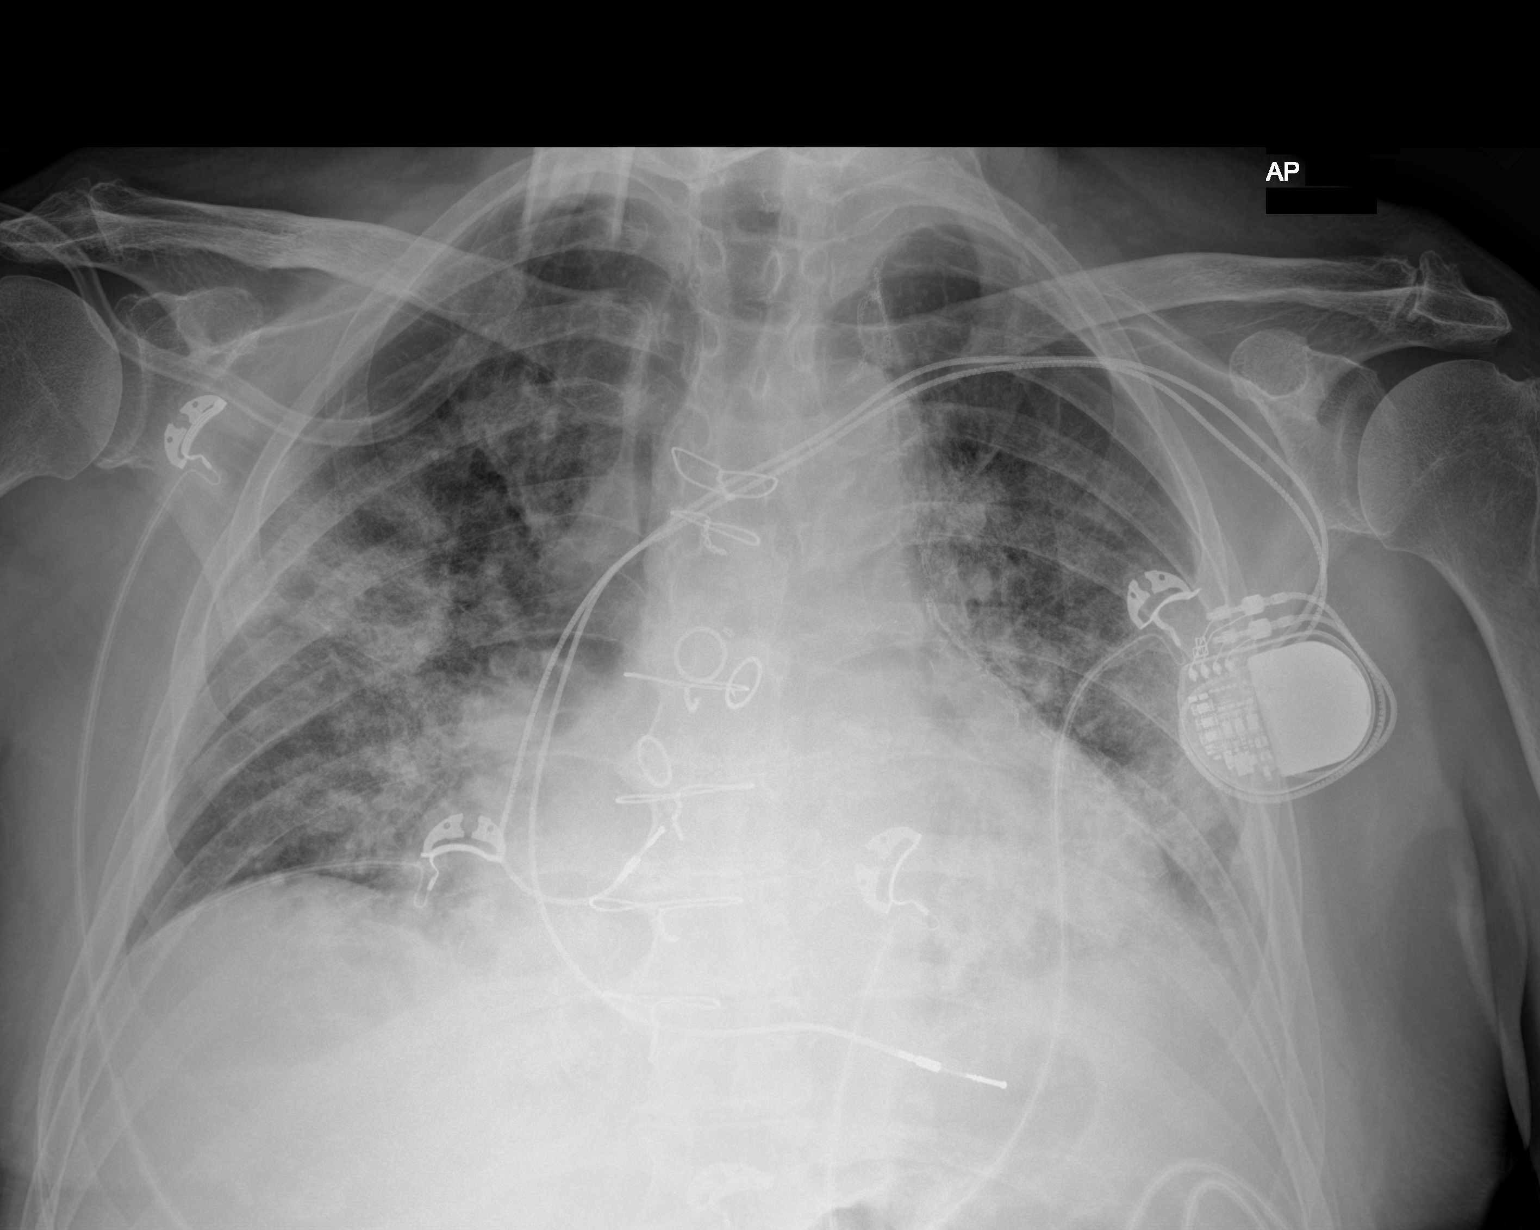

[1 of 1 positions shown; findings below may reference images not displayed]

FINDINGS: Moderate bilateral patchy airspace disease is new since the prior
study. Probable COVID pneumonia.

Postop CABG. Dual lead pacemaker. Cardiac enlargement. No
significant effusion.
IMPRESSION: Diffuse patchy bilateral airspace disease most compatible with COVID
pneumonia.

## 2021-06-04 IMAGING — DX DG CHEST 1V
1 series · 1 of 1 positions shown · non-contrast
Comparison: CT 12/07/2020.  Chest x-ray 12/07/2020.

CLINICAL DATA: Acute respiratory failure.  COVID.

EXAM:
CHEST  1 VIEW

[chest ap]
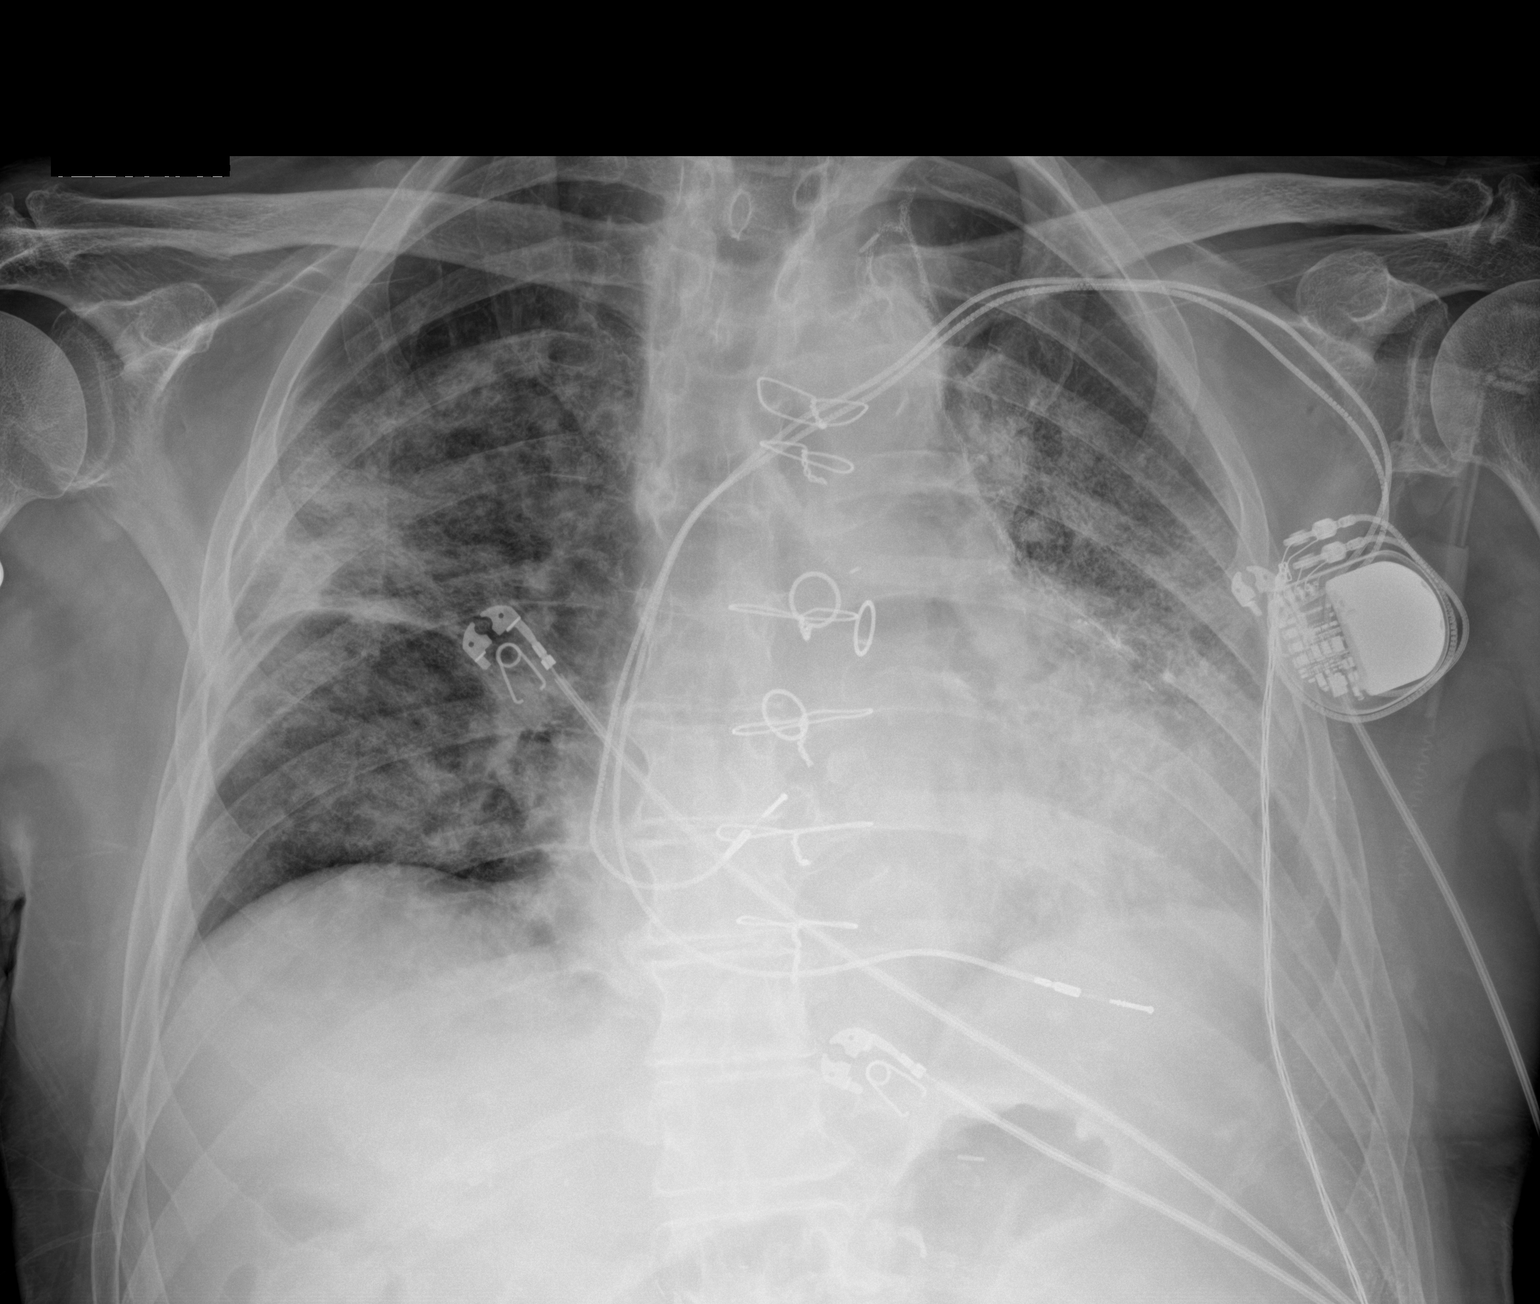

[1 of 1 positions shown; findings below may reference images not displayed]

FINDINGS: Cardiac pacer with lead tips over the right atrium right ventricle.
Prior CABG. Stable cardiomegaly. Postsurgical changes left lung.
Diffuse severe bilateral pulmonary infiltrates/edema again noted.
IMPRESSION: 1. Cardiac pacer with lead tips over the right atrium right
ventricle. Prior CABG. Stable cardiomegaly.
2. Diffuse severe bilateral pulmonary infiltrates/edema again noted.
Findings again consistent with COVID pneumonia. No interim change.

## 2021-06-10 IMAGING — US US RENAL
1 series · 13 of 25 positions shown · non-contrast
Comparison: CT 09/02/2018

CLINICAL DATA: Acute renal failure

EXAM:
RENAL / URINARY TRACT ULTRASOUND COMPLETE

[Series 1: us renal · 0.24mm/px · 13 of 35 slices shown]
[im 1/35]
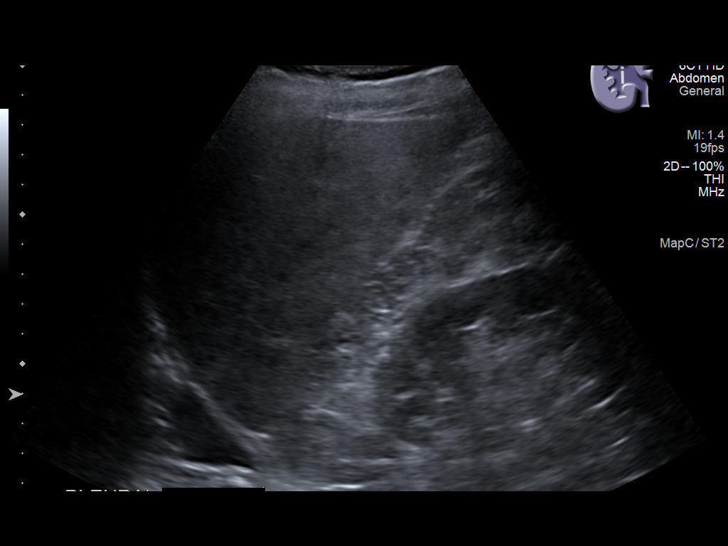
[im 3/35]
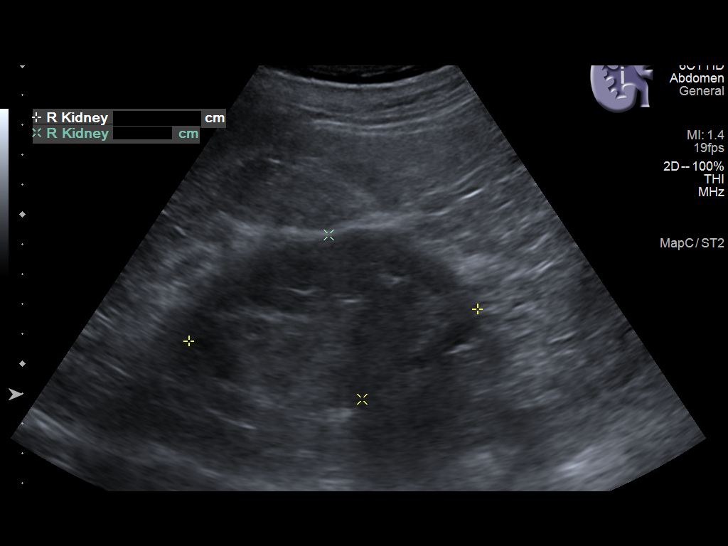
[im 6/35]
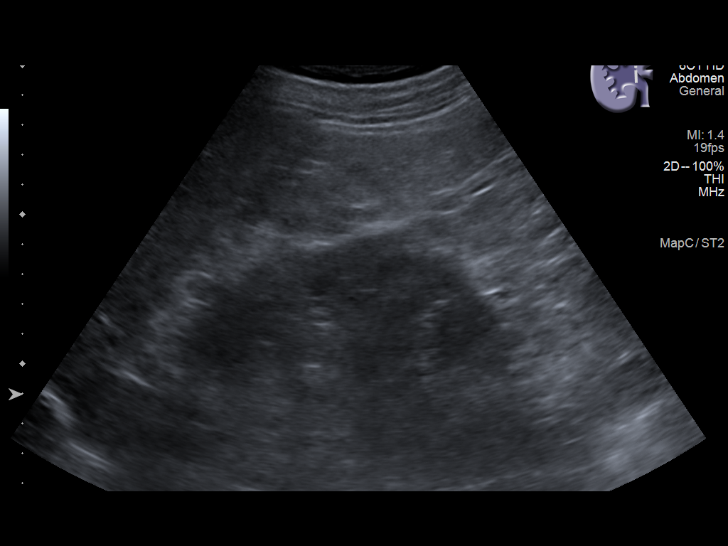
[im 9/35]
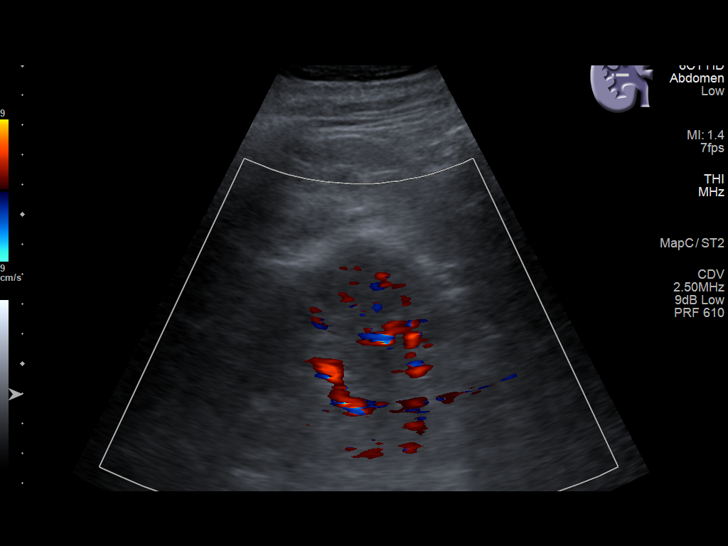
[im 12/35]
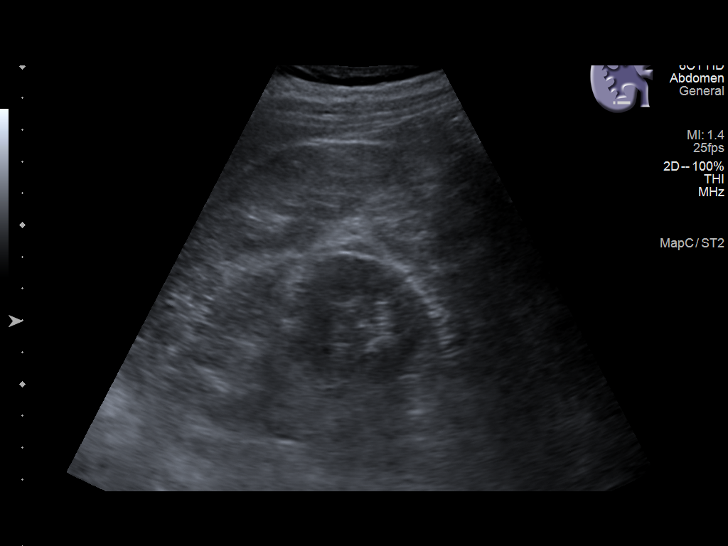
[im 15/35]
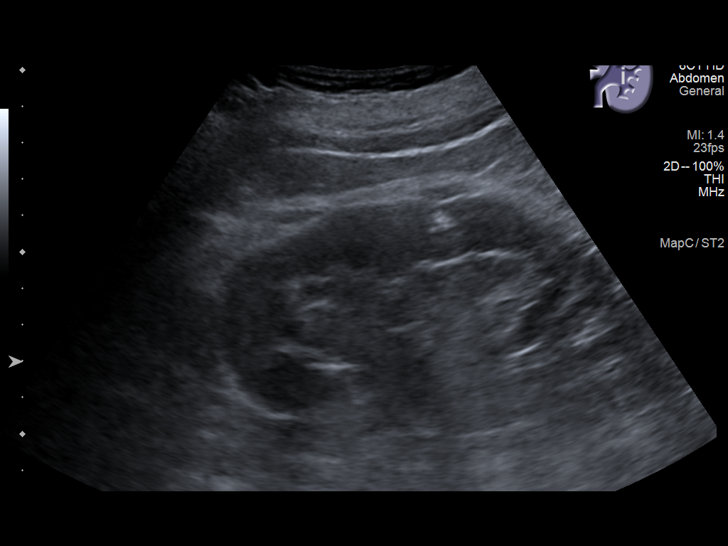
[im 18/35]
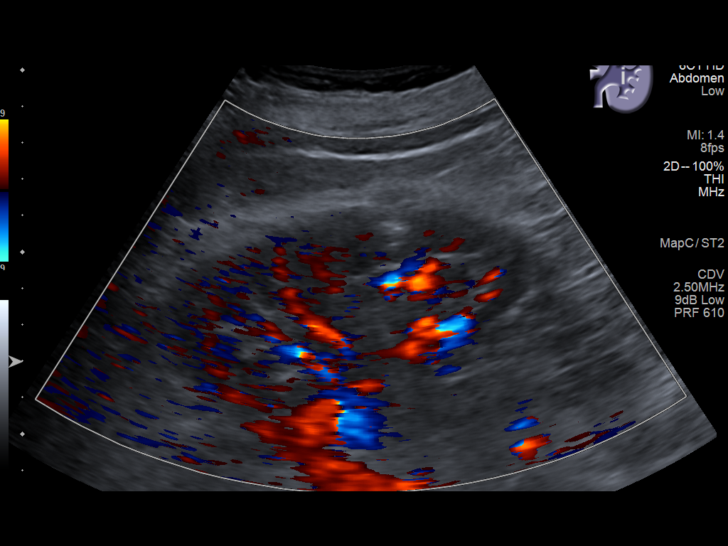
[im 20/35]
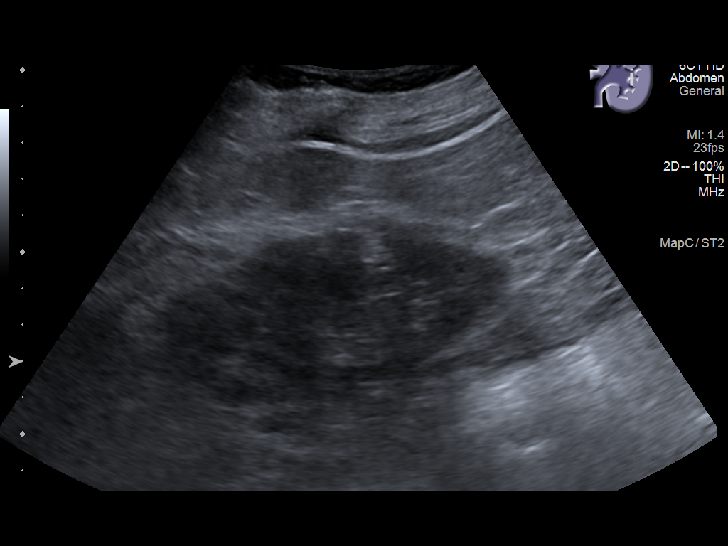
[im 23/35]
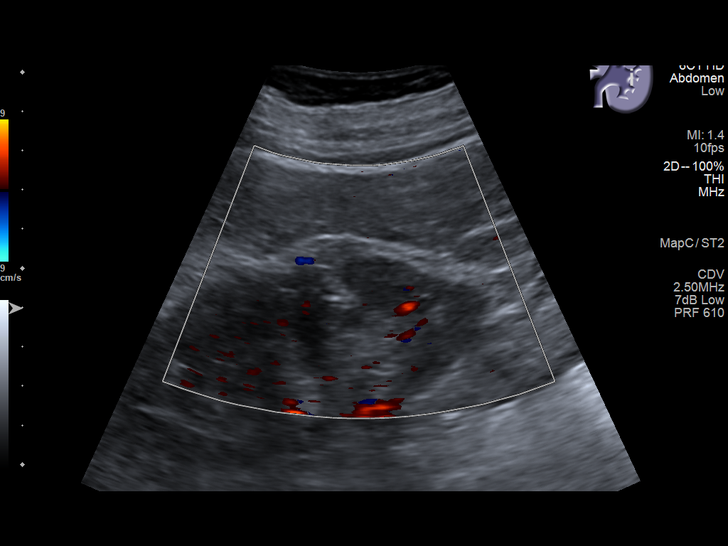
[im 26/35]
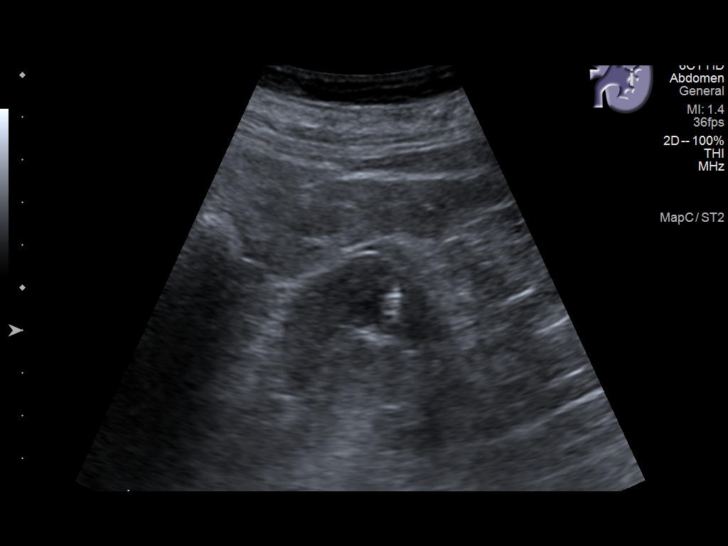
[im 29/35]
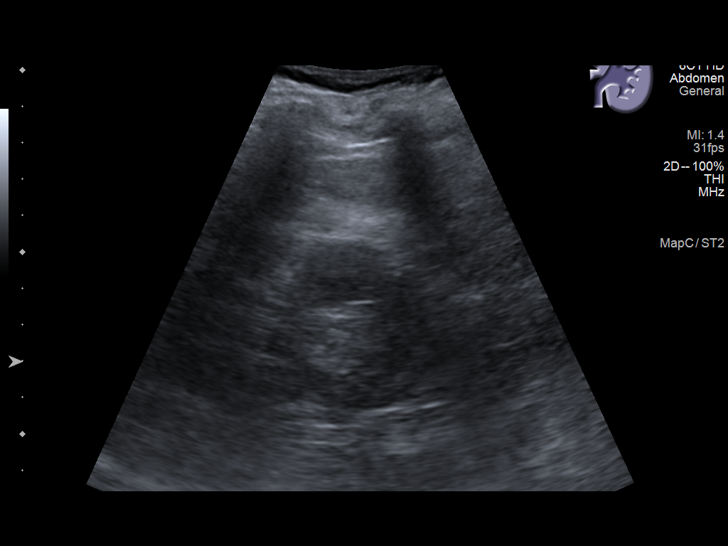
[im 32/35]
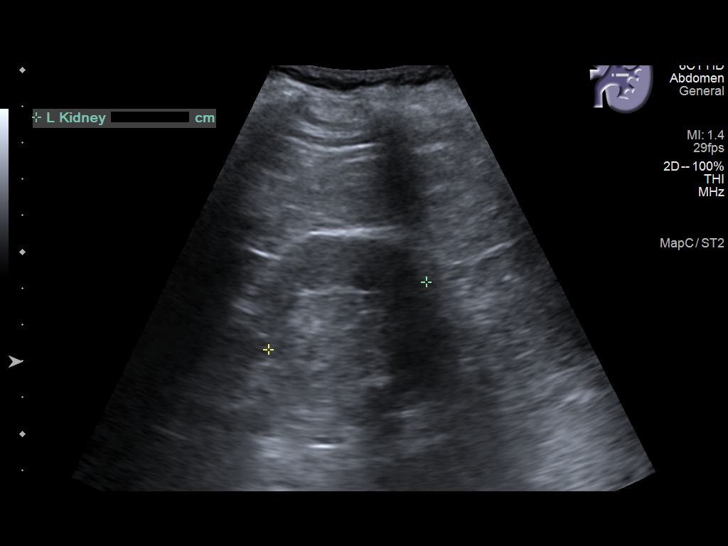
[im 35/35]
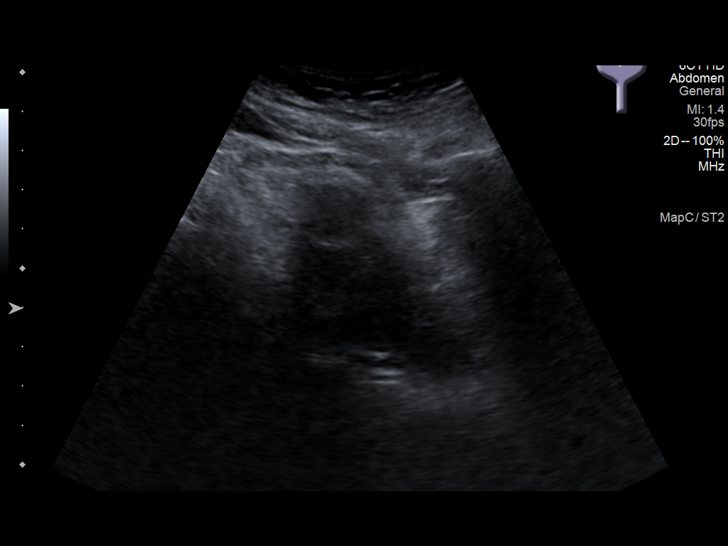

[13 of 25 positions shown; findings below may reference images not displayed]

FINDINGS: Right Kidney:

Renal measurements: 9.7 x 5.6 x 3.9 cm = volume: 112 mL.
Echogenicity is within normal limits. No concerning renal mass,
shadowing calculus or hydronephrosis.

Left Kidney:

Renal measurements: 9.8 x 5.1 x 4.7 cm = volume: 123 mL.
Echogenicity is within normal limits. Within the interpolar left
kidney is a 0.9 x 0.8 x 0.5 cm focus with some possible cystic
component and more peripheral echogenic shadowing which could
correspond to a minimally complex cystic focus seen on comparison CT
09/02/2018. No other concerning renal mass, shadowing calculus or
hydronephrosis.

Bladder:

Poorly visualized sonographically due to decompression by Foley
catheter.

Other:

None.
IMPRESSION: 1. 9 mm focus in the left kidney with some peripheral echogenicity
may reflect a complex cyst present on comparison CT 09/02/2018.
Could consider further characterization with dedicated renal MR
imaging on a nonemergent outpatient basis versus repeat sonographic
imaging in 6 months.
2. Right kidney is unremarkable.
3. Urinary bladder poorly assessed due to decompression by Foley
catheter.
